# Patient Record
Sex: Male | Born: 1960 | Race: White | Hispanic: No | Marital: Married | Smoking: Current every day smoker
Health system: Southern US, Community
[De-identification: ages and names within clinical notes are randomized; demographics above are authoritative.]

## PROBLEM LIST (undated history)

## (undated) DIAGNOSIS — C801 Malignant (primary) neoplasm, unspecified: Secondary | ICD-10-CM

## (undated) DIAGNOSIS — R0600 Dyspnea, unspecified: Secondary | ICD-10-CM

## (undated) DIAGNOSIS — F419 Anxiety disorder, unspecified: Secondary | ICD-10-CM

## (undated) DIAGNOSIS — G709 Myoneural disorder, unspecified: Secondary | ICD-10-CM

## (undated) DIAGNOSIS — J449 Chronic obstructive pulmonary disease, unspecified: Secondary | ICD-10-CM

## (undated) DIAGNOSIS — E2740 Unspecified adrenocortical insufficiency: Secondary | ICD-10-CM

## (undated) DIAGNOSIS — K760 Fatty (change of) liver, not elsewhere classified: Secondary | ICD-10-CM

## (undated) DIAGNOSIS — F32A Depression, unspecified: Secondary | ICD-10-CM

## (undated) DIAGNOSIS — J439 Emphysema, unspecified: Secondary | ICD-10-CM

## (undated) DIAGNOSIS — H269 Unspecified cataract: Secondary | ICD-10-CM

## (undated) DIAGNOSIS — E079 Disorder of thyroid, unspecified: Secondary | ICD-10-CM

## (undated) DIAGNOSIS — C439 Malignant melanoma of skin, unspecified: Secondary | ICD-10-CM

## (undated) DIAGNOSIS — Z87442 Personal history of urinary calculi: Secondary | ICD-10-CM

## (undated) DIAGNOSIS — F1910 Other psychoactive substance abuse, uncomplicated: Secondary | ICD-10-CM

## (undated) HISTORY — DX: Emphysema, unspecified: J43.9

## (undated) HISTORY — DX: Myoneural disorder, unspecified: G70.9

## (undated) HISTORY — PX: WISDOM TOOTH EXTRACTION: SHX21

## (undated) HISTORY — DX: Anxiety disorder, unspecified: F41.9

## (undated) HISTORY — DX: Other psychoactive substance abuse, uncomplicated: F19.10

## (undated) HISTORY — DX: Unspecified cataract: H26.9

## (undated) HISTORY — DX: Disorder of thyroid, unspecified: E07.9

## (undated) HISTORY — DX: Malignant melanoma of skin, unspecified: C43.9

---

## 2015-10-30 DIAGNOSIS — H02839 Dermatochalasis of unspecified eye, unspecified eyelid: Secondary | ICD-10-CM | POA: Diagnosis not present

## 2015-10-30 DIAGNOSIS — H2513 Age-related nuclear cataract, bilateral: Secondary | ICD-10-CM | POA: Diagnosis not present

## 2015-10-30 DIAGNOSIS — H40003 Preglaucoma, unspecified, bilateral: Secondary | ICD-10-CM | POA: Diagnosis not present

## 2016-11-30 ENCOUNTER — Ambulatory Visit (INDEPENDENT_AMBULATORY_CARE_PROVIDER_SITE_OTHER): Payer: BLUE CROSS/BLUE SHIELD | Admitting: Adult Health

## 2016-11-30 ENCOUNTER — Encounter: Payer: Self-pay | Admitting: Adult Health

## 2016-11-30 VITALS — BP 122/81 | HR 73 | Ht 72.0 in | Wt 166.0 lb

## 2016-11-30 DIAGNOSIS — F17200 Nicotine dependence, unspecified, uncomplicated: Secondary | ICD-10-CM

## 2016-11-30 DIAGNOSIS — Z789 Other specified health status: Secondary | ICD-10-CM

## 2016-11-30 DIAGNOSIS — F1020 Alcohol dependence, uncomplicated: Secondary | ICD-10-CM | POA: Insufficient documentation

## 2016-11-30 DIAGNOSIS — R6889 Other general symptoms and signs: Secondary | ICD-10-CM | POA: Diagnosis not present

## 2016-11-30 DIAGNOSIS — Z720 Tobacco use: Secondary | ICD-10-CM | POA: Insufficient documentation

## 2016-11-30 DIAGNOSIS — Z0000 Encounter for general adult medical examination without abnormal findings: Secondary | ICD-10-CM

## 2016-11-30 DIAGNOSIS — R5383 Other fatigue: Secondary | ICD-10-CM | POA: Diagnosis not present

## 2016-11-30 NOTE — Progress Notes (Signed)
Subjective:    Patient ID: Ginger Carne, male    DOB: 1960/07/16, 56 y.o.   MRN: [id number]  HPI:  Mr. Schreier is here to establish as a new pt.  He is a  56 year old male, who states "I am here because my lady made me come".  PMH:  Gastritis-last contact with GI specialist was 2015.  Gastritis r/t heavy EOTH consumption, he estimates to consumes >42 cans of beer/week-varies between lite-craft brands.  He smokes pack/day for the last 30 years.  He denies EOTH consumption interferes with interpersonal relationships/vocational commitments.  He reports drinking EOTH >30 years with significant increase 5 years ago, however her cannot associate a specific trigger with increased use.  He has not had regular healthcare in > 5 years.  He estimates to drink 10-15 ounces water/daily and eats only breakfast and lunch due to "drinking beer all night, I don't feel like eating dinner".  He has a gf and they have been together > 14 years and has two adult children ages 31,25.  He also reports poor sleep, typically resting only in 4 hr increments with frequent, early am wakefulness.  He estimates to sleep 6 hrs a night during week and frequent napping on weekends.  He reports recent lack of interest in hobbies/activities that began > 6 months ago and he feels that he may be mildly depressed.  He has been asked to cut back on drinking, however he doe snot believe that he is an alcoholic.  He reduced EOTH use 4 momths for a 3 week period and felt that "didn't make any bit of difference in my life".  He denies EOTH in am, reports first drink of the day after work. 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    Patient Care Team    Relationship Specialty Notifications Start End  Esaw Grandchild, NP PCP - General Family Medicine  11/30/16     Patient Active Problem List   Diagnosis Date Noted  . Routine health maintenance 11/30/2016  . Fatigue 11/30/2016  . Alcohol consumption heavy 11/30/2016  .  Tobacco use disorder 11/30/2016  . Lack of interest 11/30/2016     History reviewed. No pertinent past medical history.   Past Surgical History:  Procedure Laterality Date  . WISDOM TOOTH EXTRACTION       Family History  Problem Relation Age of Onset  . Stroke Mother   . Dementia Mother   . Stroke Maternal Grandmother      History  Drug Use No     History  Alcohol Use  . 25.2 oz/week  . 42 Cans of beer per week     History  Smoking Status  . Current Every Day Smoker  . Packs/day: 1.00  . Types: Cigarettes  . Start date: 05/17/1981  Smokeless Tobacco  . Never Used     No outpatient encounter prescriptions on file as of 11/30/2016.   No facility-administered encounter medications on file as of 11/30/2016.     Allergies: Patient has no known allergies.  Body mass index is 22.51 kg/m.  Blood pressure 122/81, pulse 73, height 6' (1.829 m), weight 166 lb (75.3 kg).   Review of Systems  Constitutional: Positive for fatigue. Negative for activity change, appetite change, chills, diaphoresis, fever and unexpected weight change.  Respiratory: Negative for cough, chest tightness, shortness of breath, wheezing and stridor.   Cardiovascular: Negative for chest pain, palpitations and leg swelling.  Gastrointestinal: Negative for abdominal  distention, abdominal pain, blood in stool, constipation, diarrhea, rectal pain and vomiting.  Endocrine: Negative for cold intolerance, heat intolerance, polydipsia, polyphagia and polyuria.  Genitourinary: Negative for difficulty urinating and flank pain.  Musculoskeletal: Negative for arthralgias, back pain, gait problem, joint swelling, myalgias, neck pain and neck stiffness.  Skin: Negative for color change, pallor, rash and wound.  Neurological: Negative for dizziness, tremors, weakness and headaches.       Intermittent neuropathy of lower extremities  Hematological: Does not bruise/bleed easily.  Psychiatric/Behavioral:  Positive for dysphoric mood and sleep disturbance. Negative for behavioral problems, decreased concentration, hallucinations, self-injury and suicidal ideas. The patient is not nervous/anxious and is not hyperactive.        Objective:   Physical Exam  Constitutional: He is oriented to person, place, and time. He appears well-developed and well-nourished. No distress.  HENT:  Head: Normocephalic and atraumatic.  Right Ear: Hearing, tympanic membrane, external ear and ear canal normal. Tympanic membrane is not erythematous and not bulging. No decreased hearing is noted.  Left Ear: Tympanic membrane, external ear and ear canal normal. Tympanic membrane is not erythematous and not bulging. No decreased hearing is noted.  Nose: Nose normal. Right sinus exhibits no maxillary sinus tenderness and no frontal sinus tenderness. Left sinus exhibits no maxillary sinus tenderness and no frontal sinus tenderness.  Mouth/Throat: Uvula is midline, oropharynx is clear and moist and mucous membranes are normal. Abnormal dentition. Dental caries present.  Eyes: Pupils are equal, round, and reactive to light. Conjunctivae are normal.  Neck: Normal range of motion. Neck supple.  Cardiovascular: Normal rate, regular rhythm, normal heart sounds and intact distal pulses.   No murmur heard. Pulmonary/Chest: Effort normal and breath sounds normal. No respiratory distress. He has no wheezes. He has no rales. He exhibits no tenderness.  Abdominal: Soft. Bowel sounds are normal. He exhibits no distension and no mass. There is no tenderness. There is no rigidity, no rebound, no guarding, no CVA tenderness, no tenderness at McBurney's point and negative Murphy's sign.  Musculoskeletal: Normal range of motion.  Lymphadenopathy:    He has no cervical adenopathy.  Neurological: He is alert and oriented to person, place, and time. Coordination normal.  Skin: Skin is warm and dry. No rash noted. He is not diaphoretic. No  erythema. No pallor.  Psychiatric: He has a normal mood and affect. His behavior is normal. Judgment and thought content normal.          Assessment & Plan:   1. Routine health maintenance   2. Fatigue, unspecified type   3. Alcohol consumption heavy   4. Tobacco use disorder   5. Lack of interest     Fatigue TSH and Vit d levels checked today.  Routine health maintenance BP at goal today- 122/81 Please reduce your tobacco and alcohol intake-WELL above CDC safe drinking guidelines. Please follow heart healthy diet and increase water intake, recommend to consumed at least 80 ounces/daily. Full fasting labs obtained today. Recommend annual follow-up, with a yearly full physical. Please find out when your last Tdap vaccine was. Contact [facility], [place] to discuss colonoscopy.   Alcohol consumption heavy Estimates >42 beers/week- varies between lite beers and craft beers. Has been drinking > 30 years, however dramatic increase in amount in the last 5 years (he denies obvious trigger prior to increase). Denies EOTH use interfering with personal relationships/vocational commitments. He denies hx of DUI. We discussed that his is WELL above CDC safe drinking guidelines-he declined referral to  mental health or addiction specialist.  Tobacco use disorder Declined tobacco cessation today.  Lack of interest Discussed that excessive EOTH use can contribute to poor sleep, decreased motivation, depression. He declined referral to CBT or addiction specialist.     FOLLOW-UP:  Return in about 1 year (around 11/30/2017) for Regular Follow Up.

## 2016-11-30 NOTE — Assessment & Plan Note (Signed)
Estimates >42 beers/week- varies between lite beers and craft beers. Has been drinking > 30 years, however dramatic increase in amount in the last 5 years (he denies obvious trigger prior to increase). Denies EOTH use interfering with personal relationships/vocational commitments. He denies hx of DUI. We discussed that his is WELL above CDC safe drinking guidelines-he declined referral to mental health or addiction specialist.

## 2016-11-30 NOTE — Patient Instructions (Addendum)
Heart-Healthy Eating Plan Many factors influence your heart health, including eating and exercise habits. Heart (coronary) risk increases with abnormal blood fat (lipid) levels. Heart-healthy meal planning includes limiting unhealthy fats, increasing healthy fats, and making other small dietary changes. This includes maintaining a healthy body weight to help keep lipid levels within a normal range. What is my plan? Your health care provider recommends that you:  Get no more than _________% of the total calories in your daily diet from fat.  Limit your intake of saturated fat to less than _________% of your total calories each day.  Limit the amount of cholesterol in your diet to less than _________ mg per day.  What types of fat should I choose?  Choose healthy fats more often. Choose monounsaturated and polyunsaturated fats, such as olive oil and canola oil, flaxseeds, walnuts, almonds, and seeds.  Eat more omega-3 fats. Good choices include salmon, mackerel, sardines, tuna, flaxseed oil, and ground flaxseeds. Aim to eat fish at least two times each week.  Limit saturated fats. Saturated fats are primarily found in animal products, such as meats, butter, and cream. Plant sources of saturated fats include palm oil, palm kernel oil, and coconut oil.  Avoid foods with partially hydrogenated oils in them. These contain trans fats. Examples of foods that contain trans fats are stick margarine, some tub margarines, cookies, crackers, and other baked goods. What general guidelines do I need to follow?  Check food labels carefully to identify foods with trans fats or high amounts of saturated fat.  Fill one half of your plate with vegetables and green salads. Eat 4-5 servings of vegetables per day. A serving of vegetables equals 1 cup of raw leafy vegetables,  cup of raw or cooked cut-up vegetables, or  cup of vegetable juice.  Fill one fourth of your plate with whole grains. Look for the word  "whole" as the first word in the ingredient list.  Fill one fourth of your plate with lean protein foods.  Eat 4-5 servings of fruit per day. A serving of fruit equals one medium whole fruit,  cup of dried fruit,  cup of fresh, frozen, or canned fruit, or  cup of 100% fruit juice.  Eat more foods that contain soluble fiber. Examples of foods that contain this type of fiber are apples, broccoli, carrots, beans, peas, and barley. Aim to get 20-30 g of fiber per day.  Eat more home-cooked food and less restaurant, buffet, and fast food.  Limit or avoid alcohol.  Limit foods that are high in starch and sugar.  Avoid fried foods.  Cook foods by using methods other than frying. Baking, boiling, grilling, and broiling are all great options. Other fat-reducing suggestions include: ? Removing the skin from poultry. ? Removing all visible fats from meats. ? Skimming the fat off of stews, soups, and gravies before serving them. ? Steaming vegetables in water or broth.  Lose weight if you are overweight. Losing just 5-10% of your initial body weight can help your overall health and prevent diseases such as diabetes and heart disease.  Increase your consumption of nuts, legumes, and seeds to 4-5 servings per week. One serving of dried beans or legumes equals  cup after being cooked, one serving of nuts equals 1 ounces, and one serving of seeds equals  ounce or 1 tablespoon.  You may need to monitor your salt (sodium) intake, especially if you have high blood pressure. Talk with your health care provider or dietitian to get  more information about reducing sodium. What foods can I eat? Grains  Breads, including [place], white, pita, wheat, raisin, rye, oatmeal, and [place]. Tortillas that are neither fried nor made with lard or trans fat. Low-fat rolls, including hotdog and hamburger buns and English muffins. Biscuits. Muffins. Waffles. Pancakes. Light popcorn. Whole-grain cereals. Flatbread.  Melba toast. Pretzels. Breadsticks. Rusks. Low-fat snacks and crackers, including oyster, saltine, matzo, graham, animal, and rye. Rice and pasta, including brown rice and those that are made with whole wheat. Vegetables All vegetables. Fruits All fruits, but limit coconut. Meats and Other Protein Sources Lean, well-trimmed beef, veal, pork, and lamb. Chicken and [place] without skin. All fish and shellfish. Wild duck, rabbit, pheasant, and venison. Egg whites or low-cholesterol egg substitutes. Dried beans, peas, lentils, and tofu.Seeds and most nuts. Dairy Low-fat or nonfat cheeses, including ricotta, string, and mozzarella. Skim or 1% milk that is liquid, powdered, or evaporated. Buttermilk that is made with low-fat milk. Nonfat or low-fat yogurt. Beverages Mineral water. Diet carbonated beverages. Sweets and Desserts Sherbets and fruit ices. Honey, jam, marmalade, jelly, and syrups. Meringues and gelatins. Pure sugar candy, such as hard candy, jelly beans, gumdrops, mints, marshmallows, and small amounts of dark chocolate. [employer] Eat all sweets and desserts in moderation. Fats and Oils Nonhydrogenated (trans-free) margarines. Vegetable oils, including soybean, sesame, sunflower, olive, peanut, safflower, corn, canola, and cottonseed. Salad dressings or mayonnaise that are made with a vegetable oil. Limit added fats and oils that you use for cooking, baking, salads, and as spreads. Other Cocoa powder. Coffee and tea. All seasonings and condiments. The items listed above may not be a complete list of recommended foods or beverages. Contact your dietitian for more options. What foods are not recommended? Grains Breads that are made with saturated or trans fats, oils, or whole milk. Croissants. Butter rolls. Cheese breads. Sweet rolls. Donuts. Buttered popcorn. Chow mein noodles. High-fat crackers, such as cheese or butter crackers. Meats and Other Protein Sources Fatty meats, such  as hotdogs, short ribs, sausage, spareribs, bacon, ribeye roast or steak, and mutton. High-fat deli meats, such as salami and bologna. Caviar. Domestic duck and goose. Organ meats, such as kidney, liver, sweetbreads, brains, gizzard, chitterlings, and heart. Dairy Cream, sour cream, cream cheese, and creamed cottage cheese. Whole milk cheeses, including blue (bleu), Monte[place] Jack, [place], [place], [place], [place], [place], [place], [place], and [place]. Whole or 2% milk that is liquid, evaporated, or condensed. Whole buttermilk. Cream sauce or high-fat cheese sauce. Yogurt that is made from whole milk. Beverages Regular sodas and drinks with added sugar. Sweets and Desserts Frosting. Pudding. Cookies. Cakes other than angel food cake. Candy that has milk chocolate or white chocolate, hydrogenated fat, butter, coconut, or unknown ingredients. Buttered syrups. Full-fat ice cream or ice cream drinks. Fats and Oils Gravy that has suet, meat fat, or shortening. Cocoa butter, hydrogenated oils, palm oil, coconut oil, palm kernel oil. These can often be found in baked products, candy, fried foods, nondairy creamers, and whipped toppings. Solid fats and shortenings, including bacon fat, salt pork, lard, and butter. Nondairy cream substitutes, such as coffee creamers and sour cream substitutes. Salad dressings that are made of unknown oils, cheese, or sour cream. The items listed above may not be a complete list of foods and beverages to avoid. Contact your dietitian for more information. This information is not intended to replace advice given to you by your health care provider. Make sure you discuss any questions you have with your health care  provider. Document Released: 02/10/2008 Document Revised: 11/21/2015 Document Reviewed: 10/25/2013 Elsevier Interactive Patient Education  2017 [place] blood pressure is within normal limits today- 122/81 Please reduce your tobacco and alcohol  intake. Please follow heart healthy diet and increase water intake, recommend to consumed at least 80 ounces/daily. We will call when lab results are available. Recommend annual follow-up, with a yearly full physical.  Please find out when your last Tdap vaccine was.  Contact [facility], [place] to discuss colonoscopy. [address], [place] [address]  [phone]  WELCOME TO THE PRACTICE!

## 2016-11-30 NOTE — Assessment & Plan Note (Signed)
Discussed that excessive EOTH use can contribute to poor sleep, decreased motivation, depression. He declined referral to CBT or addiction specialist.

## 2016-11-30 NOTE — Assessment & Plan Note (Signed)
TSH and Vit d levels checked today.

## 2016-11-30 NOTE — Assessment & Plan Note (Signed)
Declined tobacco cessation today 

## 2016-11-30 NOTE — Assessment & Plan Note (Addendum)
BP at goal today- 122/81 Please reduce your tobacco and alcohol intake-WELL above CDC safe drinking guidelines. Please follow heart healthy diet and increase water intake, recommend to consumed at least 80 ounces/daily. Full fasting labs obtained today. Recommend annual follow-up, with a yearly full physical. Please find out when your last Tdap vaccine was. Contact [facility], [place] to discuss colonoscopy.

## 2016-12-02 ENCOUNTER — Telehealth: Payer: Self-pay

## 2016-12-02 ENCOUNTER — Other Ambulatory Visit: Payer: Self-pay

## 2016-12-02 ENCOUNTER — Other Ambulatory Visit: Payer: Self-pay | Admitting: Adult Health

## 2016-12-02 DIAGNOSIS — Z789 Other specified health status: Secondary | ICD-10-CM

## 2016-12-02 DIAGNOSIS — E559 Vitamin D deficiency, unspecified: Secondary | ICD-10-CM | POA: Insufficient documentation

## 2016-12-02 LAB — CBC WITH DIFFERENTIAL/PLATELET
BASOS ABS: 0 10*3/uL (ref 0.0–0.2)
BASOS: 0 %
EOS (ABSOLUTE): 0.2 10*3/uL (ref 0.0–0.4)
Eos: 3 %
Hematocrit: 48.6 % (ref 37.5–51.0)
Hemoglobin: 16.7 g/dL (ref 13.0–17.7)
IMMATURE GRANS (ABS): 0 10*3/uL (ref 0.0–0.1)
IMMATURE GRANULOCYTES: 0 %
LYMPHS: 25 %
Lymphocytes Absolute: 1.5 10*3/uL (ref 0.7–3.1)
MCH: 34.6 pg — ABNORMAL HIGH (ref 26.6–33.0)
MCHC: 34.4 g/dL (ref 31.5–35.7)
MCV: 101 fL — AB (ref 79–97)
Monocytes Absolute: 0.6 10*3/uL (ref 0.1–0.9)
Monocytes: 10 %
NEUTROS PCT: 62 %
Neutrophils Absolute: 3.7 10*3/uL (ref 1.4–7.0)
PLATELETS: 207 10*3/uL (ref 150–379)
RBC: 4.82 x10E6/uL (ref 4.14–5.80)
RDW: 12.9 % (ref 12.3–15.4)
WBC: 6 10*3/uL (ref 3.4–10.8)

## 2016-12-02 LAB — COMPREHENSIVE METABOLIC PANEL
ALT: 84 IU/L — ABNORMAL HIGH (ref 0–44)
AST: 70 IU/L — AB (ref 0–40)
Albumin/Globulin Ratio: 1.7 (ref 1.2–2.2)
Albumin: 4.5 g/dL (ref 3.5–5.5)
Alkaline Phosphatase: 123 IU/L — ABNORMAL HIGH (ref 39–117)
BUN/Creatinine Ratio: 13 (ref 9–20)
BUN: 10 mg/dL (ref 6–24)
Bilirubin Total: 0.8 mg/dL (ref 0.0–1.2)
CALCIUM: 9.2 mg/dL (ref 8.7–10.2)
CO2: 21 mmol/L (ref 20–29)
CREATININE: 0.8 mg/dL (ref 0.76–1.27)
Chloride: 102 mmol/L (ref 96–106)
GFR calc Af Amer: 115 mL/min/{1.73_m2} (ref >59)
GFR, EST NON AFRICAN AMERICAN: 100 mL/min/{1.73_m2} (ref >59)
GLUCOSE: 90 mg/dL (ref 65–99)
Globulin, Total: 2.6 g/dL (ref 1.5–4.5)
Potassium: 4.6 mmol/L (ref 3.5–5.2)
Sodium: 140 mmol/L (ref 134–144)
Total Protein: 7.1 g/dL (ref 6.0–8.5)

## 2016-12-02 LAB — LIPID PANEL
Chol/HDL Ratio: 3.4 ratio (ref 0.0–5.0)
Cholesterol, Total: 174 mg/dL (ref 100–199)
HDL: 51 mg/dL (ref >39)
LDL Calculated: 102 mg/dL — ABNORMAL HIGH (ref 0–99)
Triglycerides: 104 mg/dL (ref 0–149)
VLDL Cholesterol Cal: 21 mg/dL (ref 5–40)

## 2016-12-02 LAB — VITAMIN D 25 HYDROXY (VIT D DEFICIENCY, FRACTURES): VIT D 25 HYDROXY: 12.5 ng/mL — AB (ref 30.0–100.0)

## 2016-12-02 LAB — HEMOGLOBIN A1C
Est. average glucose Bld gHb Est-mCnc: 103 mg/dL
HEMOGLOBIN A1C: 5.2 % (ref 4.8–5.6)

## 2016-12-02 LAB — TSH: TSH: 1.02 u[IU]/mL (ref 0.450–4.500)

## 2016-12-02 LAB — HEPATIC FUNCTION PANEL: BILIRUBIN, DIRECT: 0.27 mg/dL (ref 0.00–0.40)

## 2016-12-02 MED ORDER — VITAMIN D (ERGOCALCIFEROL) 1.25 MG (50000 UNIT) PO CAPS: 50000.0000 [IU] | ORAL_CAPSULE | ORAL | 0 refills | Status: DC

## 2016-12-02 NOTE — Telephone Encounter (Signed)
-----   Message from Esaw Grandchild, NP sent at 12/02/2016 10:02 AM EDT ----- Good Morning, Please call [facility] and share that overall his labs look ok, exception: Vit d is quite low at 12.5-please start once weekly Rx vit d and we will re-check level in 4 months. Due to heavy beer consumption- liver function is very abnormal, AST 70 (normal 0-40), ALT 84 (0-44), Alkaline Phosphatase 123 (normal 39-117). Please encourage him to reduce daily beer drinking (we discussed this at length at initial OV). Please encourage him to make GI appt for colonoscopy. We will recheck liver function in 6 months. Thanks!

## 2016-12-02 NOTE — Telephone Encounter (Signed)
Left message for patient to call office to discuss lab results and recommendations. 

## 2017-03-18 ENCOUNTER — Other Ambulatory Visit: Payer: Self-pay | Admitting: Adult Health

## 2017-03-22 ENCOUNTER — Other Ambulatory Visit: Payer: Self-pay | Admitting: Adult Health

## 2017-05-04 ENCOUNTER — Other Ambulatory Visit: Payer: BLUE CROSS/BLUE SHIELD

## 2017-05-04 DIAGNOSIS — E559 Vitamin D deficiency, unspecified: Secondary | ICD-10-CM

## 2017-05-04 DIAGNOSIS — Z789 Other specified health status: Secondary | ICD-10-CM | POA: Diagnosis not present

## 2017-05-05 LAB — HEPATIC FUNCTION PANEL
ALBUMIN: 4.5 g/dL (ref 3.5–5.5)
ALK PHOS: 87 IU/L (ref 39–117)
ALT: 53 IU/L — ABNORMAL HIGH (ref 0–44)
AST: 53 IU/L — ABNORMAL HIGH (ref 0–40)
BILIRUBIN TOTAL: 0.5 mg/dL (ref 0.0–1.2)
BILIRUBIN, DIRECT: 0.18 mg/dL (ref 0.00–0.40)
TOTAL PROTEIN: 7.2 g/dL (ref 6.0–8.5)

## 2017-05-05 LAB — VITAMIN D 25 HYDROXY (VIT D DEFICIENCY, FRACTURES): VIT D 25 HYDROXY: 50.1 ng/mL (ref 30.0–100.0)

## 2017-06-01 ENCOUNTER — Encounter: Payer: Self-pay | Admitting: Adult Health

## 2017-06-01 ENCOUNTER — Ambulatory Visit (INDEPENDENT_AMBULATORY_CARE_PROVIDER_SITE_OTHER): Payer: BLUE CROSS/BLUE SHIELD | Admitting: Adult Health

## 2017-06-01 VITALS — BP 111/70 | HR 69 | Temp 98.2°F | Ht 72.0 in | Wt 173.2 lb

## 2017-06-01 DIAGNOSIS — J0100 Acute maxillary sinusitis, unspecified: Secondary | ICD-10-CM

## 2017-06-01 DIAGNOSIS — F17200 Nicotine dependence, unspecified, uncomplicated: Secondary | ICD-10-CM

## 2017-06-01 DIAGNOSIS — R05 Cough: Secondary | ICD-10-CM

## 2017-06-01 DIAGNOSIS — Z0000 Encounter for general adult medical examination without abnormal findings: Secondary | ICD-10-CM | POA: Diagnosis not present

## 2017-06-01 DIAGNOSIS — R059 Cough, unspecified: Secondary | ICD-10-CM

## 2017-06-01 MED ORDER — HYDROCODONE-HOMATROPINE 5-1.5 MG PO TABS: 1.0000 | ORAL_TABLET | Freq: Two times a day (BID) | ORAL | 0 refills | Status: DC | PRN

## 2017-06-01 MED ORDER — AZITHROMYCIN 250 MG PO TABS: ORAL_TABLET | ORAL | 0 refills | Status: DC

## 2017-06-01 MED ORDER — FLUTICASONE PROPIONATE 50 MCG/ACT NA SUSP: 2.0000 | Freq: Every day | NASAL | 6 refills | Status: DC

## 2017-06-01 NOTE — Assessment & Plan Note (Signed)
Z pack Flonase Stop tobacco use

## 2017-06-01 NOTE — Assessment & Plan Note (Signed)
Declined smoking cessation today. Reduce-stop tobacco use

## 2017-06-01 NOTE — Assessment & Plan Note (Signed)
[place] Controlled Substance Database reviewed- no contraindications noted. Please take Azithromycin, HydrocodoneHomatropine 5/1.5 tabs Q12H PRN cough, and Flonase as directed. Increase water/rest/vit c-2,000mg /day when not feeling well. Do not drink alcohol when taking cough medicine. Reduce-stop tobacco use. If symptoms persist after antibiotic completed, please call clinic. Work Excuse provided, okay to return 06/04/17

## 2017-06-01 NOTE — Assessment & Plan Note (Signed)
Please schedule full physical this Spring to address chronic issues.

## 2017-06-01 NOTE — Patient Instructions (Addendum)
Acute Bronchitis, Adult Acute bronchitis is sudden (acute) swelling of the air tubes (bronchi) in the lungs. Acute bronchitis causes these tubes to fill with mucus, which can make it hard to breathe. It can also cause coughing or wheezing. In adults, acute bronchitis usually goes away within 2 weeks. A cough caused by bronchitis may last up to 3 weeks. Smoking, allergies, and asthma can make the condition worse. Repeated episodes of bronchitis may cause further lung problems, such as chronic obstructive pulmonary disease (COPD). What are the causes? This condition can be caused by germs and by substances that irritate the lungs, including:  Cold and flu viruses. This condition is most often caused by the same virus that causes a cold.  Bacteria.  Exposure to tobacco smoke, dust, fumes, and air pollution.  What increases the risk? This condition is more likely to develop in people who:  Have close contact with someone with acute bronchitis.  Are exposed to lung irritants, such as tobacco smoke, dust, fumes, and vapors.  Have a weak immune system.  Have a respiratory condition such as asthma.  What are the signs or symptoms? Symptoms of this condition include:  A cough.  Coughing up clear, yellow, or green mucus.  Wheezing.  Chest congestion.  Shortness of breath.  A fever.  Body aches.  Chills.  A sore throat.  How is this diagnosed? This condition is usually diagnosed with a physical exam. During the exam, your health care provider may order tests, such as chest X-rays, to rule out other conditions. He or she may also:  Test a sample of your mucus for bacterial infection.  Check the level of oxygen in your blood. This is done to check for pneumonia.  Do a chest X-ray or lung function testing to rule out pneumonia and other conditions.  Perform blood tests.  Your health care provider will also ask about your symptoms and medical history. How is this  treated? Most cases of acute bronchitis clear up over time without treatment. Your health care provider may recommend:  Drinking more fluids. Drinking more makes your mucus thinner, which may make it easier to breathe.  Taking a medicine for a fever or cough.  Taking an antibiotic medicine.  Using an inhaler to help improve shortness of breath and to control a cough.  Using a cool mist vaporizer or humidifier to make it easier to breathe.  Follow these instructions at home: Medicines  Take over-the-counter and prescription medicines only as told by your health care provider.  If you were prescribed an antibiotic, take it as told by your health care provider. Do not stop taking the antibiotic even if you start to feel better. General instructions  Get plenty of rest.  Drink enough fluids to keep your urine clear or pale yellow.  Avoid smoking and secondhand smoke. Exposure to cigarette smoke or irritating chemicals will make bronchitis worse. If you smoke and you need help quitting, ask your health care provider. Quitting smoking will help your lungs heal faster.  Use an inhaler, cool mist vaporizer, or humidifier as told by your health care provider.  Keep all follow-up visits as told by your health care provider. This is important. How is this prevented? To lower your risk of getting this condition again:  Wash your hands often with soap and water. If soap and water are not available, use hand sanitizer.  Avoid contact with people who have cold symptoms.  Try not to touch your hands to your   mouth, nose, or eyes.  Make sure to get the flu shot every year.  Contact a health care provider if:  Your symptoms do not improve in 2 weeks of treatment. Get help right away if:  You cough up blood.  You have chest pain.  You have severe shortness of breath.  You become dehydrated.  You faint or keep feeling like you are going to faint.  You keep vomiting.  You have a  severe headache.  Your fever or chills gets worse. This information is not intended to replace advice given to you by your health care provider. Make sure you discuss any questions you have with your health care provider. Document Released: 06/10/2004 Document Revised: 11/26/2015 Document Reviewed: 10/22/2015 Elsevier Interactive Patient Education  2018 [employer].   Sinusitis, Adult Sinusitis is soreness and inflammation of your sinuses. Sinuses are hollow spaces in the bones around your face. Your sinuses are located:  Around your eyes.  In the middle of your forehead.  Behind your nose.  In your cheekbones.  Your sinuses and nasal passages are lined with a stringy fluid (mucus). Mucus normally drains out of your sinuses. When your nasal tissues become inflamed or swollen, the mucus can become trapped or blocked so air cannot flow through your sinuses. This allows bacteria, viruses, and funguses to grow, which leads to infection. Sinusitis can develop quickly and last for 7?10 days (acute) or for more than 12 weeks (chronic). Sinusitis often develops after a cold. What are the causes? This condition is caused by anything that creates swelling in the sinuses or stops mucus from draining, including:  Allergies.  Asthma.  Bacterial or viral infection.  Abnormally shaped bones between the nasal passages.  Nasal growths that contain mucus (nasal polyps).  Narrow sinus openings.  Pollutants, such as chemicals or irritants in the air.  A foreign object stuck in the nose.  A fungal infection. This is rare.  What increases the risk? The following factors may make you more likely to develop this condition:  Having allergies or asthma.  Having had a recent cold or respiratory tract infection.  Having structural deformities or blockages in your nose or sinuses.  Having a weak immune system.  Doing a lot of swimming or diving.  Overusing nasal  sprays.  Smoking.  What are the signs or symptoms? The main symptoms of this condition are pain and a feeling of pressure around the affected sinuses. Other symptoms include:  Upper toothache.  Earache.  Headache.  Bad breath.  Decreased sense of smell and taste.  A cough that may get worse at night.  Fatigue.  Fever.  Thick drainage from your nose. The drainage is often green and it may contain pus (purulent).  Stuffy nose or congestion.  Postnasal drip. This is when extra mucus collects in the throat or back of the nose.  Swelling and warmth over the affected sinuses.  Sore throat.  Sensitivity to light.  How is this diagnosed? This condition is diagnosed based on symptoms, a medical history, and a physical exam. To find out if your condition is acute or chronic, your health care provider may:  Look in your nose for signs of nasal polyps.  Tap over the affected sinus to check for signs of infection.  View the inside of your sinuses using an imaging device that has a light attached (endoscope).  If your health care provider suspects that you have chronic sinusitis, you may also:  Be tested for allergies.  Have a sample of mucus taken from your nose (nasal culture) and checked for bacteria.  Have a mucus sample examined to see if your sinusitis is related to an allergy.  If your sinusitis does not respond to treatment and it lasts longer than 8 weeks, you may have an MRI or CT scan to check your sinuses. These scans also help to determine how severe your infection is. In rare cases, a bone biopsy may be done to rule out more serious types of fungal sinus disease. How is this treated? Treatment for sinusitis depends on the cause and whether your condition is chronic or acute. If a virus is causing your sinusitis, your symptoms will go away on their own within 10 days. You may be given medicines to relieve your symptoms, including:  Topical nasal decongestants.  They shrink swollen nasal passages and let mucus drain from your sinuses.  Antihistamines. These drugs block inflammation that is triggered by allergies. This can help to ease swelling in your nose and sinuses.  Topical nasal corticosteroids. These are nasal sprays that ease inflammation and swelling in your nose and sinuses.  Nasal saline washes. These rinses can help to get rid of thick mucus in your nose.  If your condition is caused by bacteria, you will be given an antibiotic medicine. If your condition is caused by a fungus, you will be given an antifungal medicine. Surgery may be needed to correct underlying conditions, such as narrow nasal passages. Surgery may also be needed to remove polyps. Follow these instructions at home: Medicines  Take, use, or apply over-the-counter and prescription medicines only as told by your health care provider. These may include nasal sprays.  If you were prescribed an antibiotic medicine, take it as told by your health care provider. Do not stop taking the antibiotic even if you start to feel better. Hydrate and Humidify  Drink enough water to keep your urine clear or pale yellow. Staying hydrated will help to thin your mucus.  Use a cool mist humidifier to keep the humidity level in your home above 50%.  Inhale steam for 10-15 minutes, 3-4 times a day or as told by your health care provider. You can do this in the bathroom while a hot shower is running.  Limit your exposure to cool or dry air. Rest  Rest as much as possible.  Sleep with your head raised (elevated).  Make sure to get enough sleep each night. General instructions  Apply a warm, moist washcloth to your face 3-4 times a day or as told by your health care provider. This will help with discomfort.  Wash your hands often with soap and water to reduce your exposure to viruses and other germs. If soap and water are not available, use hand sanitizer.  Do not smoke. Avoid being  around people who are smoking (secondhand smoke).  Keep all follow-up visits as told by your health care provider. This is important. Contact a health care provider if:  You have a fever.  Your symptoms get worse.  Your symptoms do not improve within 10 days. Get help right away if:  You have a severe headache.  You have persistent vomiting.  You have pain or swelling around your face or eyes.  You have vision problems.  You develop confusion.  Your neck is stiff.  You have trouble breathing. This information is not intended to replace advice given to you by your health care provider. Make sure you discuss any questions you  have with your health care provider. Document Released: 05/03/2005 Document Revised: 12/28/2015 Document Reviewed: 02/26/2015 Elsevier Interactive Patient Education  2018 [employer].  Please take Azithromycin, Cough medicine, and Flonase as directed. Increase water/rest/vit c-2,000mg /day when not feeling well. Do not drink alcohol when taking cough syrup. Reduce-stop tobacco use. If symptoms persist after antibiotic completed, please call clinic. Work Excuse provided, okay to return 06/04/17 FEEL BETTER! Please schedule full physical this Spring to address chronic issues.

## 2017-06-01 NOTE — Progress Notes (Signed)
Subjective:    Patient ID: George West, male    DOB: 1960-05-23, 57 y.o.   MRN: [id number]  HPI:  George West presents with productive cough (thick/yellow mucus), copious thick/yellow nasal drainage, fatigue, and "ear fullness" that all began last week and is steadily worsening.  He continues to smoke, but has reduced amount to 1/2 pack a day.  He has also reduced ETOH use, only one drink in the last week due to "feeling so badly". He has not taken any OTC remedies. He only drinks coffee and sweat tea, no plain water.  Patient Care Team    Relationship Specialty Notifications Start End  Esaw Grandchild, NP PCP - General Family Medicine  11/30/16     Patient Active Problem List   Diagnosis Date Noted  . Cough in adult 06/01/2017  . Acute maxillary sinusitis 06/01/2017  . Vitamin D deficiency 12/02/2016  . Routine health maintenance 11/30/2016  . Fatigue 11/30/2016  . Alcohol consumption heavy 11/30/2016  . Tobacco use disorder 11/30/2016  . Lack of interest 11/30/2016     History reviewed. No pertinent past medical history.   Past Surgical History:  Procedure Laterality Date  . WISDOM TOOTH EXTRACTION       Family History  Problem Relation Age of Onset  . Stroke Mother   . Dementia Mother   . Stroke Maternal Grandmother      Social History   Substance and Sexual Activity  Drug Use No     Social History   Substance and Sexual Activity  Alcohol Use Yes  . Alcohol/week: 25.2 oz  . Types: 42 Cans of beer per week     Social History   Tobacco Use  Smoking Status Current Every Day Smoker  . Packs/day: 1.00  . Types: Cigarettes  . Start date: 05/17/1981  Smokeless Tobacco Never Used     Outpatient Encounter Medications as of 06/01/2017  Medication Sig  . Vitamin D, Ergocalciferol, (DRISDOL) 50000 units CAPS capsule TAKE 1 CAPSULE BY MOUTH EVERY 7 DAYS.  Marland Kitchen azithromycin (ZITHROMAX) 250 MG tablet 2 tabs by mouth day one, 1 tab by mouth days 2-5  .  fluticasone (FLONASE) 50 MCG/ACT nasal spray Place 2 sprays into both nostrils daily.  . Hydrocodone-Homatropine 5-1.5 MG TABS Take 1 tablet by mouth 2 (two) times daily as needed.   No facility-administered encounter medications on file as of 06/01/2017.     Allergies: Patient has no known allergies.  Body mass index is 23.49 kg/m.  Blood pressure 111/70, pulse 69, temperature 98.2 F (36.8 C), temperature source Oral, height 6' (1.829 m), weight 173 lb 3.2 oz (78.6 kg), SpO2 96 %.      Review of Systems  Constitutional: Positive for activity change and fatigue. Negative for appetite change, chills, diaphoresis, fever and unexpected weight change.  HENT: Positive for congestion, postnasal drip, rhinorrhea, sinus pressure and sneezing. Negative for sinus pain, sore throat, trouble swallowing and voice change.   Eyes: Negative for visual disturbance.  Respiratory: Positive for cough and chest tightness. Negative for shortness of breath, wheezing and stridor.   Cardiovascular: Negative for chest pain, palpitations and leg swelling.  Gastrointestinal: Negative for abdominal distention, abdominal pain, anal bleeding, constipation, diarrhea, nausea and vomiting.  Endocrine: Negative for cold intolerance, heat intolerance, polydipsia, polyphagia and polyuria.  Genitourinary: Negative for difficulty urinating, frequency and hematuria.  Neurological: Negative for dizziness and headaches.  Hematological: Does not bruise/bleed easily.  Psychiatric/Behavioral: Positive for sleep disturbance.  Objective:   Physical Exam  Constitutional: He appears well-developed and well-nourished. No distress.  HENT:  Head: Normocephalic and atraumatic.  Right Ear: Hearing, external ear and ear canal normal. Tympanic membrane is bulging. Tympanic membrane is not erythematous. No decreased hearing is noted.  Left Ear: External ear normal. Tympanic membrane is bulging. Tympanic membrane is not  erythematous.  Nose: Mucosal edema and rhinorrhea present. Right sinus exhibits maxillary sinus tenderness. Right sinus exhibits no frontal sinus tenderness. Left sinus exhibits maxillary sinus tenderness. Left sinus exhibits no frontal sinus tenderness.  Mouth/Throat: Uvula is midline and mucous membranes are normal. Abnormal dentition. Dental caries present. Posterior oropharyngeal edema and posterior oropharyngeal erythema present. No oropharyngeal exudate or tonsillar abscesses.  Eyes: Conjunctivae are normal. Pupils are equal, round, and reactive to light.  Neck: Normal range of motion. Neck supple.  Cardiovascular: Normal rate, regular rhythm, normal heart sounds and intact distal pulses.  Pulmonary/Chest: Effort normal and breath sounds normal. No respiratory distress. He has no wheezes. He has no rales. He exhibits no tenderness.  Lymphadenopathy:    He has no cervical adenopathy.  Skin: Skin is warm and dry. No rash noted. He is not diaphoretic. No erythema. No pallor.  Psychiatric: He has a normal mood and affect. His behavior is normal. Judgment and thought content normal.  Nursing note and vitals reviewed.         Assessment & Plan:   1. Cough in adult   2. Routine health maintenance   3. Acute maxillary sinusitis, recurrence not specified   4. Tobacco use disorder     Routine health maintenance Please schedule full physical this Spring to address chronic issues.  Tobacco use disorder Declined smoking cessation today. Reduce-stop tobacco use  Acute maxillary sinusitis Z pack Flonase Stop tobacco use  Cough in adult [place] Controlled Substance Database reviewed- no contraindications noted. Please take Azithromycin, HydrocodoneHomatropine 5/1.5 tabs Q12H PRN cough, and Flonase as directed. Increase water/rest/vit c-2,000mg /day when not feeling well. Do not drink alcohol when taking cough medicine. Reduce-stop tobacco use. If symptoms persist after  antibiotic completed, please call clinic. Work Excuse provided, okay to return 06/04/17    FOLLOW-UP:  Return in about 3 months (around 08/30/2017) for CPE.

## 2017-07-26 ENCOUNTER — Other Ambulatory Visit: Payer: Self-pay | Admitting: Family Medicine

## 2017-07-26 NOTE — Telephone Encounter (Signed)
Please review and refill if appropriate.  T. Merie Wulf, CMA  

## 2017-08-11 ENCOUNTER — Other Ambulatory Visit: Payer: Self-pay | Admitting: Family Medicine

## 2017-08-25 ENCOUNTER — Other Ambulatory Visit: Payer: BLUE CROSS/BLUE SHIELD

## 2017-08-25 ENCOUNTER — Other Ambulatory Visit: Payer: Self-pay

## 2017-08-25 DIAGNOSIS — Z0000 Encounter for general adult medical examination without abnormal findings: Secondary | ICD-10-CM

## 2017-08-25 DIAGNOSIS — Z789 Other specified health status: Secondary | ICD-10-CM | POA: Diagnosis not present

## 2017-08-25 DIAGNOSIS — E559 Vitamin D deficiency, unspecified: Secondary | ICD-10-CM

## 2017-08-25 DIAGNOSIS — R5383 Other fatigue: Secondary | ICD-10-CM

## 2017-08-26 LAB — COMPREHENSIVE METABOLIC PANEL
A/G RATIO: 1.5 (ref 1.2–2.2)
ALK PHOS: 96 IU/L (ref 39–117)
ALT: 28 IU/L (ref 0–44)
AST: 25 IU/L (ref 0–40)
Albumin: 4.2 g/dL (ref 3.5–5.5)
BILIRUBIN TOTAL: 0.9 mg/dL (ref 0.0–1.2)
BUN/Creatinine Ratio: 11 (ref 9–20)
BUN: 9 mg/dL (ref 6–24)
CHLORIDE: 106 mmol/L (ref 96–106)
CO2: 23 mmol/L (ref 20–29)
Calcium: 9 mg/dL (ref 8.7–10.2)
Creatinine, Ser: 0.81 mg/dL (ref 0.76–1.27)
GFR calc non Af Amer: 99 mL/min/{1.73_m2} (ref >59)
GFR, EST AFRICAN AMERICAN: 114 mL/min/{1.73_m2} (ref >59)
GLUCOSE: 94 mg/dL (ref 65–99)
Globulin, Total: 2.8 g/dL (ref 1.5–4.5)
POTASSIUM: 4.2 mmol/L (ref 3.5–5.2)
Sodium: 141 mmol/L (ref 134–144)
TOTAL PROTEIN: 7 g/dL (ref 6.0–8.5)

## 2017-08-26 LAB — CBC WITH DIFFERENTIAL/PLATELET
BASOS ABS: 0 10*3/uL (ref 0.0–0.2)
Basos: 0 %
EOS (ABSOLUTE): 0.3 10*3/uL (ref 0.0–0.4)
Eos: 4 %
Hematocrit: 45.5 % (ref 37.5–51.0)
Hemoglobin: 15.8 g/dL (ref 13.0–17.7)
Immature Grans (Abs): 0 10*3/uL (ref 0.0–0.1)
Immature Granulocytes: 0 %
LYMPHS: 34 %
Lymphocytes Absolute: 2.5 10*3/uL (ref 0.7–3.1)
MCH: 34.1 pg — AB (ref 26.6–33.0)
MCHC: 34.7 g/dL (ref 31.5–35.7)
MCV: 98 fL — ABNORMAL HIGH (ref 79–97)
Monocytes Absolute: 0.7 10*3/uL (ref 0.1–0.9)
Monocytes: 10 %
NEUTROS ABS: 3.8 10*3/uL (ref 1.4–7.0)
Neutrophils: 52 %
PLATELETS: 212 10*3/uL (ref 150–379)
RBC: 4.64 x10E6/uL (ref 4.14–5.80)
RDW: 13.7 % (ref 12.3–15.4)
WBC: 7.3 10*3/uL (ref 3.4–10.8)

## 2017-08-26 LAB — LIPID PANEL
CHOL/HDL RATIO: 3.1 ratio (ref 0.0–5.0)
Cholesterol, Total: 174 mg/dL (ref 100–199)
HDL: 56 mg/dL (ref >39)
LDL CALC: 92 mg/dL (ref 0–99)
Triglycerides: 130 mg/dL (ref 0–149)
VLDL CHOLESTEROL CAL: 26 mg/dL (ref 5–40)

## 2017-08-26 LAB — TSH: TSH: 1.49 u[IU]/mL (ref 0.450–4.500)

## 2017-08-26 LAB — VITAMIN D 25 HYDROXY (VIT D DEFICIENCY, FRACTURES): Vit D, 25-Hydroxy: 42 ng/mL (ref 30.0–100.0)

## 2017-08-26 LAB — HEMOGLOBIN A1C
Est. average glucose Bld gHb Est-mCnc: 105 mg/dL
HEMOGLOBIN A1C: 5.3 % (ref 4.8–5.6)

## 2017-08-29 NOTE — Progress Notes (Addendum)
Subjective:    Patient ID: George West, male    DOB: January 29, 1961, 57 y.o.   MRN: [id number]  HPI: 11/30/16 OV: George West is here to establish as a new pt.  He is a  57 year old male, who states "I am here because my lady made me come".  PMH:  Gastritis-last contact with GI specialist was 2015.  Gastritis r/t heavy EOTH consumption, he estimates to consumes >42 cans of beer/week-varies between lite-craft brands.  He smokes pack/day for the last 30 years.  He denies EOTH consumption interferes with interpersonal relationships/vocational commitments.  He reports drinking EOTH >30 years with significant increase 5 years ago, however her cannot associate a specific trigger with increased use.  He has not had regular healthcare in > 5 years.  He estimates to drink 10-15 ounces water/daily and eats only breakfast and lunch due to "drinking beer all night, I don't feel like eating dinner".  He has a gf and they have been together > 14 years and has two adult children ages 63,25.  He also reports poor sleep, typically resting only in 4 hr increments with frequent, early am wakefulness.  He estimates to sleep 6 hrs a night during week and frequent napping on weekends.  He reports recent lack of interest in hobbies/activities that began > 6 months ago and he feels that he may be mildly depressed.  He has been asked to cut back on drinking, however he doe snot believe that he is an alcoholic.  He reduced EOTH use 4 momths for a 3 week period and felt that "didn't make any bit of difference in my life".  He denies EOTH in am, reports first drink of the day after work. 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    08/30/17 OV: George West presents for CPE He is concerned about sig fatigue for the last 2 years.  He also reports dyspnea with exertion, ie walking up flight of stairs He continues to smoke, >pack/day He drinks 3-4 beers/ay He drinks pot of coffee, last intake around 1400 He  reports frequent waking in early am He estimates to walk mile/day when at work, denies formal exercise outside of work  Reviewed recent lab results   Patient Care Team    Relationship Specialty Notifications Start End  Danford, Berna Spare, NP PCP - General Family Medicine  11/30/16     Patient Active Problem List   Diagnosis Date Noted  . Encounter for screening fecal occult blood testing 08/30/2017  . Shortness of breath on exertion 08/30/2017  . Cough in adult 06/01/2017  . Acute maxillary sinusitis 06/01/2017  . Vitamin D deficiency 12/02/2016  . Routine health maintenance 11/30/2016  . Fatigue 11/30/2016  . Alcohol consumption heavy 11/30/2016  . Tobacco use disorder 11/30/2016  . Lack of interest 11/30/2016     History reviewed. No pertinent past medical history.   Past Surgical History:  Procedure Laterality Date  . WISDOM TOOTH EXTRACTION       Family History  Problem Relation Age of Onset  . Stroke Mother   . Dementia Mother   . Stroke Maternal Grandmother      Social History   Substance and Sexual Activity  Drug Use No     Social History   Substance and Sexual Activity  Alcohol Use Yes  . Alcohol/week: 25.2 oz  . Types: 42 Cans of beer per week     Social History   Tobacco Use  Smoking Status Current Every Day Smoker  . Packs/day: 1.00  . Types: Cigarettes  . Start date: 05/17/1981  Smokeless Tobacco Never Used     Outpatient Encounter Medications as of 08/30/2017  Medication Sig  . [DISCONTINUED] azithromycin (ZITHROMAX) 250 MG tablet 2 tabs by mouth day one, 1 tab by mouth days 2-5  . [DISCONTINUED] fluticasone (FLONASE) 50 MCG/ACT nasal spray Place 2 sprays into both nostrils daily.  . [DISCONTINUED] Hydrocodone-Homatropine 5-1.5 MG TABS Take 1 tablet by mouth 2 (two) times daily as needed.  . [DISCONTINUED] Vitamin D, Ergocalciferol, (DRISDOL) 50000 units CAPS capsule TAKE 1 CAPSULE BY MOUTH EVERY 7 DAYS.   No facility-administered  encounter medications on file as of 08/30/2017.     Allergies: Patient has no known allergies.  Body mass index is 23.86 kg/m.  Blood pressure 129/81, pulse 78, height 6' (1.829 m), weight 175 lb 14.4 oz (79.8 kg), SpO2 95 %.   Review of Systems  Constitutional: Positive for fatigue. Negative for activity change, appetite change, chills, diaphoresis, fever and unexpected weight change.  Respiratory: Positive for shortness of breath. Negative for cough, chest tightness, wheezing and stridor.   Cardiovascular: Negative for chest pain, palpitations and leg swelling.  Gastrointestinal: Negative for abdominal distention, abdominal pain, blood in stool, constipation, diarrhea, rectal pain and vomiting.  Endocrine: Negative for cold intolerance, heat intolerance, polydipsia, polyphagia and polyuria.  Genitourinary: Negative for difficulty urinating and flank pain.  Musculoskeletal: Negative for arthralgias, back pain, gait problem, joint swelling, myalgias, neck pain and neck stiffness.  Skin: Negative for color change, pallor, rash and wound.  Neurological: Negative for dizziness, tremors, weakness and headaches.       Intermittent neuropathy of lower extremities  Hematological: Does not bruise/bleed easily.  Psychiatric/Behavioral: Positive for dysphoric mood and sleep disturbance. Negative for behavioral problems, decreased concentration, hallucinations, self-injury and suicidal ideas. The patient is not nervous/anxious and is not hyperactive.        Objective:   Physical Exam  Constitutional: He is oriented to person, place, and time. He appears well-developed and well-nourished. No distress.  HENT:  Head: Normocephalic and atraumatic.  Right Ear: Hearing, tympanic membrane, external ear and ear canal normal. Tympanic membrane is not erythematous and not bulging. No decreased hearing is noted.  Left Ear: Tympanic membrane, external ear and ear canal normal. Tympanic membrane is not  erythematous and not bulging. No decreased hearing is noted.  Nose: Nose normal. Right sinus exhibits no maxillary sinus tenderness and no frontal sinus tenderness. Left sinus exhibits no maxillary sinus tenderness and no frontal sinus tenderness.  Mouth/Throat: Uvula is midline, oropharynx is clear and moist and mucous membranes are normal. Abnormal dentition. Dental caries present.  Eyes: Pupils are equal, round, and reactive to light. Conjunctivae are normal.  Neck: Normal range of motion. Neck supple.  Cardiovascular: Normal rate, regular rhythm, normal heart sounds and intact distal pulses.  No murmur heard. Pulmonary/Chest: Effort normal and breath sounds normal. No respiratory distress. He has no wheezes. He has no rales. He exhibits no tenderness.  Abdominal: Soft. Bowel sounds are normal. He exhibits no distension and no mass. There is no tenderness. There is no rigidity, no rebound, no guarding, no CVA tenderness, no tenderness at McBurney's point and negative Murphy's sign.  Genitourinary: Prostate normal. Rectal exam shows guaiac negative stool.  Genitourinary Comments: Chaperone present during examination.  Musculoskeletal: Normal range of motion.  Lymphadenopathy:    He has no cervical adenopathy.  Neurological: He is alert and oriented  to person, place, and time. Coordination normal.  Skin: Skin is warm and dry. No rash noted. He is not diaphoretic. No erythema. No pallor.  Onychomycosis of toenails  Psychiatric: He has a normal mood and affect. His behavior is normal. Judgment and thought content normal.      Assessment & Plan:   1. Encounter for screening fecal occult blood testing   2. Encounter for screening for malignant neoplasm of respiratory organs   3. Routine health maintenance   4. Fatigue, unspecified type   5. Shortness of breath on exertion   6. Alcohol consumption heavy   7. Tobacco use disorder     Routine health maintenance Overall labs look  good. Cologuard ordered. ECHO ordered to address shortness of breath with exertion and fatigue. CT scan ordered due to smoking history. Increase plain water intake. Decrease caffeine intake. Decrease beer intake, no more than 2/day Continue to walk as much as possible Follow-up 4 weeks, re: fatigue/shortness of breath  Shortness of breath on exertion Exercise Stress Echo ordered Reduce-stop tobacco use Reduce ETOH use  Alcohol consumption heavy Discussed CDC safe drinking guidelines, encouraged him not to consume more than 2 beers/day  Tobacco use disorder Currently smoking pack/day He declined smoking cessation today    FOLLOW-UP:  Return in about 1 month (around 09/27/2017) for Fatigue, Shortness of breath with exertion.

## 2017-08-30 ENCOUNTER — Ambulatory Visit (INDEPENDENT_AMBULATORY_CARE_PROVIDER_SITE_OTHER): Payer: BLUE CROSS/BLUE SHIELD | Admitting: Adult Health

## 2017-08-30 ENCOUNTER — Encounter: Payer: Self-pay | Admitting: Adult Health

## 2017-08-30 VITALS — BP 129/81 | HR 78 | Ht 72.0 in | Wt 175.9 lb

## 2017-08-30 DIAGNOSIS — F17200 Nicotine dependence, unspecified, uncomplicated: Secondary | ICD-10-CM | POA: Diagnosis not present

## 2017-08-30 DIAGNOSIS — R5383 Other fatigue: Secondary | ICD-10-CM

## 2017-08-30 DIAGNOSIS — R0602 Shortness of breath: Secondary | ICD-10-CM | POA: Diagnosis not present

## 2017-08-30 DIAGNOSIS — Z789 Other specified health status: Secondary | ICD-10-CM | POA: Diagnosis not present

## 2017-08-30 DIAGNOSIS — Z122 Encounter for screening for malignant neoplasm of respiratory organs: Secondary | ICD-10-CM | POA: Diagnosis not present

## 2017-08-30 DIAGNOSIS — Z0000 Encounter for general adult medical examination without abnormal findings: Secondary | ICD-10-CM | POA: Diagnosis not present

## 2017-08-30 DIAGNOSIS — Z1211 Encounter for screening for malignant neoplasm of colon: Secondary | ICD-10-CM | POA: Diagnosis not present

## 2017-08-30 LAB — IFOBT (OCCULT BLOOD): IFOBT: NEGATIVE

## 2017-08-30 NOTE — Addendum Note (Signed)
Addended by: Mina Marble D on: 08/30/2017 09:29 AM   Modules accepted: Orders

## 2017-08-30 NOTE — Assessment & Plan Note (Signed)
Discussed CDC safe drinking guidelines, encouraged him not to consume more than 2 beers/day

## 2017-08-30 NOTE — Assessment & Plan Note (Addendum)
Exercise Stress Echo ordered Reduce-stop tobacco use Reduce ETOH use

## 2017-08-30 NOTE — Assessment & Plan Note (Signed)
Currently smoking pack/day He declined smoking cessation today

## 2017-08-30 NOTE — Patient Instructions (Signed)

## 2017-08-30 NOTE — Assessment & Plan Note (Signed)
Overall labs look good. Cologuard ordered. ECHO ordered to address shortness of breath with exertion and fatigue. CT scan ordered due to smoking history. Increase plain water intake. Decrease caffeine intake. Decrease beer intake, no more than 2/day Continue to walk as much as possible Follow-up 4 weeks, re: fatigue/shortness of breath

## 2017-09-01 ENCOUNTER — Telehealth (HOSPITAL_COMMUNITY): Payer: Self-pay | Admitting: *Deleted

## 2017-09-01 ENCOUNTER — Telehealth: Payer: Self-pay

## 2017-09-01 NOTE — Telephone Encounter (Signed)
Called [employer] specialty service for PA for low dose lung CT for screening for lung CA.  PA did not meet medical necessity criteria.  Please call [employer] for Peer to Peer review at [phone].  Charyl Bigger, CMA

## 2017-09-01 NOTE — Telephone Encounter (Signed)
-----   Message from [facility]on[facility] sent at 08/30/2017  8:34 AM EDT ----- Regarding: [employer] PA for CT Lung Screen Patient is sch at [facility] ([address] location) on 09/13/17 for the CT Lung Screen. Need [employer] PA

## 2017-09-01 NOTE — Telephone Encounter (Signed)
Patient given detailed instructions per Stress Test Requisition Sheet for test on 09/06/17 at 7:30.Patient Notified to arrive 30 minutes early, and that it is imperative to arrive on time for appointment to keep from having the test rescheduled.  Patient verbalized understanding. Veronia Beets

## 2017-09-06 ENCOUNTER — Encounter (HOSPITAL_COMMUNITY): Payer: Self-pay

## 2017-09-06 ENCOUNTER — Ambulatory Visit (HOSPITAL_COMMUNITY): Payer: BLUE CROSS/BLUE SHIELD | Attending: Cardiology

## 2017-09-06 ENCOUNTER — Ambulatory Visit (HOSPITAL_COMMUNITY): Payer: BLUE CROSS/BLUE SHIELD

## 2017-09-06 ENCOUNTER — Telehealth (HOSPITAL_COMMUNITY): Payer: Self-pay | Admitting: Radiology

## 2017-09-06 NOTE — Telephone Encounter (Signed)
09/06/17-Patient unable to complete stress echocardiogram due to extreme shortness of breath. Patient requested the treadmill be stopped. Max heart rate was not reached so test was aborted.  Sending note to ordering physician.

## 2017-09-07 NOTE — Telephone Encounter (Signed)
Good Morning George Houseman, PA completed Auth Number- [id number] LDCT to be completed at [place] at [facility] in [facility] should call pt to schedule imaging appt. Thanks! Valetta Fuller

## 2017-09-07 NOTE — Telephone Encounter (Signed)
See PA information.  Charyl Bigger, CMA

## 2017-09-13 ENCOUNTER — Telehealth (HOSPITAL_COMMUNITY): Payer: Self-pay | Admitting: Radiology

## 2017-09-13 ENCOUNTER — Encounter (HOSPITAL_COMMUNITY): Payer: Self-pay | Admitting: Radiology

## 2017-09-13 ENCOUNTER — Ambulatory Visit: Payer: BLUE CROSS/BLUE SHIELD

## 2017-09-13 NOTE — Telephone Encounter (Signed)
Encounter made in error. 

## 2017-09-14 ENCOUNTER — Other Ambulatory Visit: Payer: Self-pay | Admitting: Adult Health

## 2017-09-14 ENCOUNTER — Other Ambulatory Visit: Payer: Self-pay

## 2017-09-14 DIAGNOSIS — R0602 Shortness of breath: Secondary | ICD-10-CM

## 2017-09-14 DIAGNOSIS — F17200 Nicotine dependence, unspecified, uncomplicated: Secondary | ICD-10-CM

## 2017-09-14 NOTE — Progress Notes (Signed)
Order changed per cards recommendation

## 2017-09-29 ENCOUNTER — Ambulatory Visit: Payer: BLUE CROSS/BLUE SHIELD | Admitting: Adult Health

## 2017-10-05 ENCOUNTER — Other Ambulatory Visit: Payer: Self-pay | Admitting: Adult Health

## 2017-10-05 ENCOUNTER — Telehealth: Payer: Self-pay

## 2017-10-05 DIAGNOSIS — R0609 Other forms of dyspnea: Secondary | ICD-10-CM

## 2017-10-05 DIAGNOSIS — R0600 Dyspnea, unspecified: Secondary | ICD-10-CM

## 2017-10-05 DIAGNOSIS — R0602 Shortness of breath: Secondary | ICD-10-CM

## 2017-10-05 NOTE — Telephone Encounter (Signed)
-----   Message from Esaw Grandchild, NP sent at 10/05/2017  8:11 AM EDT ----- Kermit Balo Morning Kenney Houseman, I have placed a referral to cardiology placed,re: increasing dyspnea with exertion.   >35 yr pack smoking hx Can you please call Mr. Grandstaff and let him know Thanks! Valetta Fuller

## 2017-10-05 NOTE — Progress Notes (Signed)
Referral to cardiology placed,re: increasing dyspnea with exertion >35 yr pack hx

## 2017-10-06 NOTE — Telephone Encounter (Signed)
Pt informed.  Pt expressed understanding and is agreeable to cards referral.  Charyl Bigger, CMA

## 2017-10-12 ENCOUNTER — Ambulatory Visit (HOSPITAL_COMMUNITY): Payer: BLUE CROSS/BLUE SHIELD

## 2020-02-20 ENCOUNTER — Ambulatory Visit (INDEPENDENT_AMBULATORY_CARE_PROVIDER_SITE_OTHER): Payer: BC Managed Care – PPO | Admitting: Internal Medicine

## 2020-02-20 ENCOUNTER — Ambulatory Visit (INDEPENDENT_AMBULATORY_CARE_PROVIDER_SITE_OTHER)
Admission: RE | Admit: 2020-02-20 | Discharge: 2020-02-20 | Disposition: A | Discharge status: Home / Self Care | Payer: BC Managed Care – PPO | Source: Ambulatory Visit | Attending: Internal Medicine | Admitting: Internal Medicine

## 2020-02-20 ENCOUNTER — Other Ambulatory Visit: Payer: Self-pay

## 2020-02-20 ENCOUNTER — Encounter: Payer: Self-pay | Admitting: Internal Medicine

## 2020-02-20 VITALS — BP 140/84 | HR 66 | Temp 97.9°F | Ht 70.75 in | Wt 171.0 lb

## 2020-02-20 DIAGNOSIS — R0602 Shortness of breath: Secondary | ICD-10-CM

## 2020-02-20 DIAGNOSIS — Z125 Encounter for screening for malignant neoplasm of prostate: Secondary | ICD-10-CM | POA: Diagnosis not present

## 2020-02-20 DIAGNOSIS — R200 Anesthesia of skin: Secondary | ICD-10-CM

## 2020-02-20 DIAGNOSIS — F39 Unspecified mood [affective] disorder: Secondary | ICD-10-CM

## 2020-02-20 DIAGNOSIS — Z23 Encounter for immunization: Secondary | ICD-10-CM | POA: Diagnosis not present

## 2020-02-20 DIAGNOSIS — Z1211 Encounter for screening for malignant neoplasm of colon: Secondary | ICD-10-CM

## 2020-02-20 DIAGNOSIS — F1020 Alcohol dependence, uncomplicated: Secondary | ICD-10-CM | POA: Diagnosis not present

## 2020-02-20 DIAGNOSIS — R0600 Dyspnea, unspecified: Secondary | ICD-10-CM | POA: Diagnosis not present

## 2020-02-20 DIAGNOSIS — J449 Chronic obstructive pulmonary disease, unspecified: Secondary | ICD-10-CM | POA: Diagnosis not present

## 2020-02-20 DIAGNOSIS — R5383 Other fatigue: Secondary | ICD-10-CM

## 2020-02-20 DIAGNOSIS — R911 Solitary pulmonary nodule: Secondary | ICD-10-CM | POA: Diagnosis not present

## 2020-02-20 LAB — RENAL FUNCTION PANEL
Albumin: 5 g/dL (ref 3.5–5.2)
BUN: 10 mg/dL (ref 6–23)
CO2: 30 mEq/L (ref 19–32)
Calcium: 10.4 mg/dL (ref 8.4–10.5)
Chloride: 99 mEq/L (ref 96–112)
Creatinine, Ser: 0.86 mg/dL (ref 0.40–1.50)
GFR: 94.18 mL/min (ref >60.00)
Glucose, Bld: 98 mg/dL (ref 70–99)
Phosphorus: 3.5 mg/dL (ref 2.3–4.6)
Potassium: 5 mEq/L (ref 3.5–5.1)
Sodium: 135 mEq/L (ref 135–145)

## 2020-02-20 LAB — CBC
HCT: 52.3 % — ABNORMAL HIGH (ref 39.0–52.0)
Hemoglobin: 17.9 g/dL — ABNORMAL HIGH (ref 13.0–17.0)
MCHC: 34.2 g/dL (ref 30.0–36.0)
MCV: 100 fl (ref 78.0–100.0)
Platelets: 221 10*3/uL (ref 150.0–400.0)
RBC: 5.23 Mil/uL (ref 4.22–5.81)
RDW: 12.8 % (ref 11.5–15.5)
WBC: 7.1 10*3/uL (ref 4.0–10.5)

## 2020-02-20 LAB — SEDIMENTATION RATE: Sed Rate: 18 mm/hr (ref 0–20)

## 2020-02-20 LAB — HEPATIC FUNCTION PANEL
ALT: 35 U/L (ref 0–53)
AST: 25 U/L (ref 0–37)
Albumin: 5 g/dL (ref 3.5–5.2)
Alkaline Phosphatase: 93 U/L (ref 39–117)
Bilirubin, Direct: 0.2 mg/dL (ref 0.0–0.3)
Total Bilirubin: 0.8 mg/dL (ref 0.2–1.2)
Total Protein: 8.5 g/dL — ABNORMAL HIGH (ref 6.0–8.3)

## 2020-02-20 LAB — VITAMIN D 25 HYDROXY (VIT D DEFICIENCY, FRACTURES): VITD: 24.38 ng/mL — ABNORMAL LOW (ref 30.00–100.00)

## 2020-02-20 LAB — VITAMIN B12: Vitamin B-12: 241 pg/mL (ref 211–911)

## 2020-02-20 LAB — PSA: PSA: 3.37 ng/mL (ref 0.10–4.00)

## 2020-02-20 LAB — T4, FREE: Free T4: 0.95 ng/dL (ref 0.60–1.60)

## 2020-02-20 MED ORDER — DULOXETINE HCL 30 MG PO CPEP: 30.0000 mg | ORAL_CAPSULE | Freq: Every day | ORAL | 3 refills | Status: DC

## 2020-02-20 NOTE — Assessment & Plan Note (Signed)
Mostly anxiety Has been self medicating with alcohol Will try the duloxetine

## 2020-02-20 NOTE — Assessment & Plan Note (Signed)
Seems to be related to anxiety based on wife's description Will try low dose duloxetine

## 2020-02-20 NOTE — Assessment & Plan Note (Signed)
Mostly related to DOE Will check labs

## 2020-02-20 NOTE — Progress Notes (Addendum)
Subjective:    Patient ID: George West, male    DOB: 28-Jan-1961, 59 y.o.   MRN: [id number]  HPI Here to establish care With wife George West This visit occurred during the SARS-CoV-2 public health emergency.  Safety protocols were in place, including screening questions prior to the visit, additional usage of staff PPE, and extensive cleaning of exam room while observing appropriate contact time as indicated for disinfecting solutions.   He has ongoing problems not being able to breathe Wife notes swelling in feet in the evening They feel cool at times--- and numb (from upper calf down bilaterally) No chest pain No palpitations No dizziness or syncope Has AM cough---brings up phlegm Smokes 1PPD---for 35 years Had been scheduled for stress echo in the past---but "wasn't able to do it" (the treadmill) The DOE has worsened  Has knot on upper right back Present for 1-2 years Wife thinks it is a cyst  Toenails discolored and thickened Some pain in great toes at times---he has to cut the nails out  No current outpatient medications on file prior to visit.   No current facility-administered medications on file prior to visit.    No Known Allergies  History reviewed. No pertinent past medical history.  Past Surgical History:  Procedure Laterality Date  . WISDOM TOOTH EXTRACTION      Family History  Problem Relation Age of Onset  . Stroke Mother   . Dementia Mother   . Stroke Maternal Grandmother   . Breast cancer Sister   . Thyroid cancer Sister   . Breast cancer Paternal Grandmother     Social History   Socioeconomic History  . Marital status: Married    Spouse name: Not on file  . Number of children: 2  . Years of education: Not on file  . Highest education level: Not on file  Occupational History  . Occupation: Public librarian    Comment: [employer]  Tobacco Use  . Smoking status: Current Every Day Smoker    Packs/day: 1.00    Types:  Cigarettes    Start date: 05/17/1981  . Smokeless tobacco: Never Used  Vaping Use  . Vaping Use: Never used  Substance and Sexual Activity  . Alcohol use: Yes    Alcohol/week: 42.0 standard drinks    Types: 42 Cans of beer per week    Comment: 6 pack per day or comparable liquor  . Drug use: No  . Sexual activity: Yes    Partners: Female    Birth control/protection: None  Other Topics Concern  . Not on file  Social History Narrative   Married   2 children--- 1 daughter/1 son   6 step children   Social Determinants of Health   Financial Resource Strain:   . Difficulty of Paying Living Expenses: Not on file  Food Insecurity:   . Worried About Charity fundraiser in the Last Year: Not on file  . Ran Out of Food in the Last Year: Not on file  Transportation Needs:   . Lack of Transportation (Medical): Not on file  . Lack of Transportation (Non-Medical): Not on file  Physical Activity:   . Days of Exercise per Week: Not on file  . Minutes of Exercise per Session: Not on file  Stress:   . Feeling of Stress : Not on file  Social Connections:   . Frequency of Communication with Friends and Family: Not on file  . Frequency of Social Gatherings with Friends and Family:  Not on file  . Attends Religious Services: Not on file  . Active Member of Clubs or Organizations: Not on file  . Attends Archivist Meetings: Not on file  . Marital Status: Not on file  Intimate Partner Violence:   . Fear of Current or Ex-Partner: Not on file  . Emotionally Abused: Not on file  . Physically Abused: Not on file  . Sexually Abused: Not on file   Review of Systems  Constitutional: Positive for fatigue. Negative for unexpected weight change.  HENT: Negative for dental problem and hearing loss.   Respiratory: Positive for cough and shortness of breath.   Cardiovascular: Positive for leg swelling. Negative for chest pain and palpitations.  Gastrointestinal: Negative for blood in stool and  constipation.       Gets abnormal sensation No N/V   Genitourinary: Negative for difficulty urinating and urgency.  Musculoskeletal: Negative for arthralgias, back pain and joint swelling.  Skin: Negative for rash.  Allergic/Immunologic: Negative for environmental allergies and immunocompromised state.  Neurological: Negative for dizziness, syncope, light-headedness and headaches.  Hematological: Bruises/bleeds easily.       Has lymph node in right neck--goes up and down (long time)  Psychiatric/Behavioral: Negative for dysphoric mood. The patient is nervous/anxious.        Not sleeping well Self medicates his anxiety with the alcohol       Objective:   Physical Exam Constitutional:      Appearance: He is well-developed.  HENT:     Mouth/Throat:     Comments: No oral lesions Cardiovascular:     Rate and Rhythm: Normal rate and regular rhythm.     Pulses: Normal pulses.     Heart sounds: No murmur heard.  No gallop.   Pulmonary:     Effort: Pulmonary effort is normal. No tachypnea or respiratory distress.     Breath sounds: Normal breath sounds. No wheezing or rales.  Chest:     Chest wall: No mass or deformity.  Abdominal:     Palpations: Abdomen is soft.     Tenderness: There is no abdominal tenderness.  Musculoskeletal:     Cervical back: Neck supple.  Lymphadenopathy:     Cervical: No cervical adenopathy.  Skin:    Findings: No rash.     Comments: Apparent cyst posterior right shoulder--- ~3cm diameter  Neurological:     Mental Status: He is alert.  Psychiatric:        Mood and Affect: Mood normal.        Behavior: Behavior normal.            Assessment & Plan:

## 2020-02-20 NOTE — Assessment & Plan Note (Addendum)
Symptoms consistent with neuropathy ?B deficiency or other cause Will check labs  Duloxetine could help this also

## 2020-02-20 NOTE — Addendum Note (Signed)
Addended by: Pilar Grammes on: 02/20/2020 02:12 PM   Modules accepted: Orders

## 2020-02-20 NOTE — Assessment & Plan Note (Addendum)
This goes back for years and is worsening Was sent for stress echo--but couldn't do the treadmill Unlikely to be CAD with the history and progression---but certainly possible Most likely chronic lung disease Will check EKG and CXR  CXR shows just hyperinflation EKG shows sinus at 53. Low voltage but normal axis and intervals. No hypertrophy or ischemic changes.no comparison  Will set up with pulmonary

## 2020-02-22 LAB — PROTEIN ELECTROPHORESIS, SERUM, WITH REFLEX
Gamma Globulin: 1.4 g/dL (ref 0.8–1.7)
Total Protein: 8.5 g/dL — ABNORMAL HIGH (ref 6.1–8.1)

## 2020-02-23 ENCOUNTER — Other Ambulatory Visit: Payer: Self-pay

## 2020-02-23 ENCOUNTER — Emergency Department (HOSPITAL_COMMUNITY): Payer: BC Managed Care – PPO

## 2020-02-23 ENCOUNTER — Emergency Department (HOSPITAL_COMMUNITY)
Admission: EM | Admit: 2020-02-23 | Discharge: 2020-02-23 | Disposition: A | Discharge status: Home / Self Care | Payer: BC Managed Care – PPO | Attending: Emergency Medicine | Admitting: Emergency Medicine

## 2020-02-23 DIAGNOSIS — R55 Syncope and collapse: Secondary | ICD-10-CM | POA: Diagnosis present

## 2020-02-23 DIAGNOSIS — R42 Dizziness and giddiness: Secondary | ICD-10-CM | POA: Diagnosis not present

## 2020-02-23 DIAGNOSIS — I959 Hypotension, unspecified: Secondary | ICD-10-CM | POA: Diagnosis not present

## 2020-02-23 DIAGNOSIS — F17210 Nicotine dependence, cigarettes, uncomplicated: Secondary | ICD-10-CM | POA: Insufficient documentation

## 2020-02-23 DIAGNOSIS — R079 Chest pain, unspecified: Secondary | ICD-10-CM | POA: Diagnosis not present

## 2020-02-23 DIAGNOSIS — E860 Dehydration: Secondary | ICD-10-CM | POA: Diagnosis not present

## 2020-02-23 LAB — CBC WITH DIFFERENTIAL/PLATELET
Abs Immature Granulocytes: 0.06 10*3/uL (ref 0.00–0.07)
Basophils Absolute: 0 10*3/uL (ref 0.0–0.1)
Basophils Relative: 0 %
Eosinophils Absolute: 0.2 10*3/uL (ref 0.0–0.5)
Eosinophils Relative: 1 %
HCT: 49.1 % (ref 39.0–52.0)
Hemoglobin: 16.4 g/dL (ref 13.0–17.0)
Immature Granulocytes: 1 %
Lymphocytes Relative: 13 %
Lymphs Abs: 1.5 10*3/uL (ref 0.7–4.0)
MCH: 33.1 pg (ref 26.0–34.0)
MCHC: 33.4 g/dL (ref 30.0–36.0)
MCV: 99 fL (ref 80.0–100.0)
Monocytes Absolute: 0.8 10*3/uL (ref 0.1–1.0)
Monocytes Relative: 7 %
Neutro Abs: 9.1 10*3/uL — ABNORMAL HIGH (ref 1.7–7.7)
Neutrophils Relative %: 78 %
Platelets: 207 10*3/uL (ref 150–400)
RBC: 4.96 MIL/uL (ref 4.22–5.81)
RDW: 11.9 % (ref 11.5–15.5)
WBC: 11.5 10*3/uL — ABNORMAL HIGH (ref 4.0–10.5)
nRBC: 0 % (ref 0.0–0.2)

## 2020-02-23 LAB — CBG MONITORING, ED: Glucose-Capillary: 97 mg/dL (ref 70–99)

## 2020-02-23 LAB — COMPREHENSIVE METABOLIC PANEL
ALT: 33 U/L (ref 0–44)
AST: 28 U/L (ref 15–41)
Albumin: 3.9 g/dL (ref 3.5–5.0)
Alkaline Phosphatase: 79 U/L (ref 38–126)
Anion gap: 14 (ref 5–15)
BUN: 12 mg/dL (ref 6–20)
CO2: 22 mmol/L (ref 22–32)
Calcium: 9.3 mg/dL (ref 8.9–10.3)
Chloride: 98 mmol/L (ref 98–111)
Creatinine, Ser: 1 mg/dL (ref 0.61–1.24)
GFR, Estimated: >60 mL/min (ref >60)
Glucose, Bld: 92 mg/dL (ref 70–99)
Potassium: 3.9 mmol/L (ref 3.5–5.1)
Sodium: 134 mmol/L — ABNORMAL LOW (ref 135–145)
Total Bilirubin: 0.6 mg/dL (ref 0.3–1.2)
Total Protein: 7.7 g/dL (ref 6.5–8.1)

## 2020-02-23 LAB — TROPONIN I (HIGH SENSITIVITY): Troponin I (High Sensitivity): 5 ng/L (ref <18)

## 2020-02-23 LAB — CK: Total CK: 54 U/L (ref 49–397)

## 2020-02-23 LAB — ETHANOL: Alcohol, Ethyl (B): 30 mg/dL — ABNORMAL HIGH (ref <10)

## 2020-02-23 MED ORDER — SODIUM CHLORIDE 0.9 % IV BOLUS
1000.0000 mL | Freq: Once | INTRAVENOUS | Status: AC
  Administered 2020-02-23: 1000 mL via INTRAVENOUS

## 2020-02-23 NOTE — ED Provider Notes (Signed)
[facility] EMERGENCY DEPARTMENT Provider Note   CSN: [id number] Arrival date & time: 02/23/20  2136     History No chief complaint on file.   George West is a 59 y.o. male with no known past medical history, who presented to the ED after a syncopal event.  Per patient, this event happened this afternoon at patient's cousin's cookout.  Patient states that after he ate he felt dizzy and walked to his truck where his wife witnessed his syncope.  Patient states that he was sweating profusely.  Patient's wife states that patient lost consciousness for about 3-4 minutes.  No seizure activities noted.  His wife states that patient systolic blood pressure was in the 80s-90s when EMS he checked his blood pressure.  Patient denies chest pain, headache, palpitation, facial drooping, slurred speech, extremity weakness, extremities pain, coughing, sore throat, runny nose..  Patient also denies head injury or other injuries.  Patient states that he does not eat anything all day until this afternoon.  He drinks about 3 coffee and 3 beers today.  Patient smoke about 1 pack a day and drink 3-6 beers a day.  Denies drug use.  Patient had a prior syncopal episode about 15 years ago and did not know the cause.  Patient report history of CVA of mother.  Denies past medical history of arrhythmia or heart attack.  No family history of heart problem.  Denies sick contact.  Patient was started on Cymbalta 3 days ago for his neuropathy.  Patient also endorses chronic shortness of breath.  HPI     No past medical history on file.  Patient Active Problem List   Diagnosis Date Noted  . Vasovagal syncope 02/23/2020  . Dehydration 02/23/2020  . Sensory loss 02/20/2020  . Mood disorder ([place]) 02/20/2020  . Encounter for screening fecal occult blood testing 08/30/2017  . Shortness of breath on exertion 08/30/2017  . Cough in adult 06/01/2017  . Vitamin D deficiency 12/02/2016  . Routine health  maintenance 11/30/2016  . Fatigue 11/30/2016  . Alcohol use disorder, moderate, dependence ([place]) 11/30/2016  . Tobacco use disorder 11/30/2016  . Lack of interest 11/30/2016    Past Surgical History:  Procedure Laterality Date  . WISDOM TOOTH EXTRACTION         Family History  Problem Relation Age of Onset  . Stroke Mother   . Dementia Mother   . Stroke Maternal Grandmother   . Breast cancer Sister   . Thyroid cancer Sister   . Breast cancer Paternal Grandmother     Social History   Tobacco Use  . Smoking status: Current Every Day Smoker    Packs/day: 1.00    Types: Cigarettes    Start date: 05/17/1981  . Smokeless tobacco: Never Used  Vaping Use  . Vaping Use: Never used  Substance Use Topics  . Alcohol use: Yes    Alcohol/week: 42.0 standard drinks    Types: 42 Cans of beer per week    Comment: 6 pack per day or comparable liquor  . Drug use: No    Home Medications Prior to Admission medications   Medication Sig Start Date End Date Taking? Authorizing Provider  DULoxetine (CYMBALTA) 30 MG capsule Take 1 capsule (30 mg total) by mouth daily. 02/20/20   [name], MD    Allergies    Patient has no known allergies.  Review of Systems   Review of Systems  Constitutional: Positive for diaphoresis.  Eyes: Negative for  visual disturbance.  Respiratory: Positive for shortness of breath. Negative for cough and chest tightness.   Cardiovascular: Negative for chest pain, palpitations and leg swelling.  Gastrointestinal: Positive for abdominal pain. Negative for diarrhea.  Genitourinary: Negative for dysuria and flank pain.  Musculoskeletal: Negative for gait problem.  Neurological: Positive for light-headedness. Negative for tremors, seizures and speech difficulty.    Physical Exam Updated Vital Signs BP (!) 153/95 (BP Location: Right Arm)   Pulse 71   Temp 98 F (36.7 C) (Oral)   Resp (!) 24   Ht 5\' 10"  (1.778 m)   Wt 78.5 kg   SpO2 97%   BMI  24.82 kg/m   Physical Exam Constitutional:      General: He is not in acute distress. HENT:     Head: Normocephalic.  Eyes:     General:        Right eye: No discharge.        Left eye: No discharge.     Extraocular Movements: Extraocular movements intact.     Conjunctiva/sclera: Conjunctivae normal.     Pupils: Pupils are equal, round, and reactive to light.  Cardiovascular:     Rate and Rhythm: Normal rate and regular rhythm.  Pulmonary:     Effort: Pulmonary effort is normal. No respiratory distress.     Breath sounds: Normal breath sounds. No wheezing.  Abdominal:     General: Bowel sounds are normal.     Tenderness: There is no abdominal tenderness. There is no right CVA tenderness or left CVA tenderness.     Comments: Tensed abdominal muscle  Musculoskeletal:        General: No tenderness. Normal range of motion.     Cervical back: Normal range of motion.     Right lower leg: No edema.     Left lower leg: No edema.  Skin:    Comments: Mild flushing.  Neurological:     Mental Status: He is alert.     Comments: PERRLA Cranial nerve non deficit Normal strength and sensation of bilateral upper and lower extremities Normal range of motion Normal reflexes   Psychiatric:        Mood and Affect: Mood normal.     ED Results / Procedures / Treatments   Labs (all labs ordered are listed, but only abnormal results are displayed) Labs Reviewed  CBC WITH DIFFERENTIAL/PLATELET - Abnormal; Notable for the following components:      Result Value   WBC 11.5 (*)    Neutro Abs 9.1 (*)    All other components within normal limits  COMPREHENSIVE METABOLIC PANEL - Abnormal; Notable for the following components:   Sodium 134 (*)    All other components within normal limits  ETHANOL - Abnormal; Notable for the following components:   Alcohol, Ethyl (B) 30 (*)    All other components within normal limits  CK  URINALYSIS, ROUTINE W REFLEX MICROSCOPIC  CBG MONITORING, ED    TROPONIN I (HIGH SENSITIVITY)    EKG EKG Interpretation  Date/Time:  Saturday February 23 2020 21:38:57 EDT Ventricular Rate:  69 PR Interval:    QRS Duration: 84 QT Interval:  402 QTC Calculation: 431 R Axis:   85 Text Interpretation: Sinus rhythm Borderline right axis deviation No previous ECGs available Confirmed by Wandra Arthurs ([id number]) on 02/23/2020 9:45:38 PM   Radiology DG Chest Port 1 View  Result Date: 02/23/2020 CLINICAL DATA:  Chest pain EXAM: PORTABLE CHEST 1 VIEW COMPARISON:  02/20/2020 FINDINGS:  The heart size and mediastinal contours are within normal limits. Both lungs are clear. The visualized skeletal structures are unremarkable. IMPRESSION: No active disease. Electronically Signed   By: Fidela Salisbury MD   On: 02/23/2020 22:35    Procedures Procedures (including critical care time)  Medications Ordered in ED Medications  sodium chloride 0.9 % bolus 1,000 mL (0 mLs Intravenous Stopped 02/23/20 2325)    ED Course  I have reviewed the triage vital signs and the nursing notes.  Pertinent labs & imaging results that were available during my care of the patient were reviewed by me and considered in my medical decision making (see chart for details).  Patient examined and seen.  Patient is in no acute distress.  Neuro exam was normal with no abnormalities in range of motion of 4 extremities.  Differentials include vasovagal and dehydration.  Low suspicion for ACS, PE, CVA or serotonin syndrome due to Cymbalta.  Obtain chest x-ray, CBC, CMP, troponin, CK, and ethanol level.  BP (!) 142/81   Pulse 75   Temp 98.2 F (36.8 C) (Oral)   Resp (!) 23   Ht 5\' 10"  (1.778 m)   Wt 78.5 kg   SpO2 96%   BMI 24.82 kg/m   Lab works came back reassuring.  CK and troponin came back within normal limits.  Chest x-ray is clear.  Patient is stable for discharge  Plan: Encourage patient to drink more fluids.  Information on vasovagal syncope provided.  Follow-up with PCP.  Come  back to the ED for additional episode of syncope or new neurological symptoms.    MDM Rules/Calculators/A&P                          Patient presents the ED with a witnessed syncopal episode.  No seizure activity observed by his wife.  His prodrome symptoms include lightheadedness and sweating.  Patient denies chest pain, chest pain, headache, palpitation, facial drooping, slurred speech, extremity weakness, extremities pain.  Patient also denies head injury or other injuries.  No past medical history of heart disease or arrhythmia.  No family of heart issue.  This is likely vasovagal with dehydration.  Low suspicion for ACS, PE, or CVA.  Patient is encouraged to hydrate appropriately and follow-up with his PCP.  Come back to the ED for additional episode of syncope or new neurological symptoms.    Final Clinical Impression(s) / ED Diagnoses Final diagnoses:  Vasovagal syncope  Dehydration    Rx / DC Orders ED Discharge Orders    None       Gaylan Gerold, DO 02/23/20 2344    Drenda Freeze, MD 02/27/20 2253

## 2020-02-23 NOTE — ED Triage Notes (Signed)
Pt arrives via [facility] for sudden onset dizziness and one syncopal episode per pt. EMS initial EKG showed ST elevation in leads V2,V3. Repeat EKGs showed less elevation per EMS.   Pt arrives A+Ox4, denies CP/SOB. Reports new medication Cymbalta started by PCP on Wednesday. Pt reports new SOB with exertion that began Wednesday.   18 g L AC. Pt given 324 ASA by EMS. Last VS 148/89, HR 75, RR18, 96% on RA, CBG 127

## 2020-02-23 NOTE — Discharge Instructions (Signed)
George West, it is a pleasure getting to know you today.  You came into the ED for an episode of loss of consciousness.  All of your lab work came back reassuring.  Chest x-ray is also clear.  This is likely a vasovagal response with dehydration.  Please drink plenty of fluids.  Please cut back on your smoking, you can reach out to your PCP for resources of smoking cessation.  Please come back to the ED for additional episode of passing out or new neurological symptoms.  Take care Dr. Alfonse Spruce

## 2020-02-24 LAB — VITAMIN B1: Vitamin B1 (Thiamine): <6 nmol/L — ABNORMAL LOW (ref 8–30)

## 2020-02-26 ENCOUNTER — Encounter: Payer: Self-pay | Admitting: Pulmonary Disease

## 2020-02-26 ENCOUNTER — Ambulatory Visit (INDEPENDENT_AMBULATORY_CARE_PROVIDER_SITE_OTHER): Payer: BC Managed Care – PPO | Admitting: Pulmonary Disease

## 2020-02-26 ENCOUNTER — Other Ambulatory Visit: Payer: Self-pay

## 2020-02-26 VITALS — BP 120/78 | HR 80 | Temp 97.4°F | Ht 70.0 in | Wt 179.0 lb

## 2020-02-26 DIAGNOSIS — J441 Chronic obstructive pulmonary disease with (acute) exacerbation: Secondary | ICD-10-CM

## 2020-02-26 DIAGNOSIS — R0609 Other forms of dyspnea: Secondary | ICD-10-CM

## 2020-02-26 DIAGNOSIS — F17200 Nicotine dependence, unspecified, uncomplicated: Secondary | ICD-10-CM

## 2020-02-26 DIAGNOSIS — R059 Cough, unspecified: Secondary | ICD-10-CM

## 2020-02-26 DIAGNOSIS — R0600 Dyspnea, unspecified: Secondary | ICD-10-CM

## 2020-02-26 LAB — PROTEIN ELECTROPHORESIS, SERUM, WITH REFLEX
Abnormal Protein Band1: 0.5 g/dL — ABNORMAL HIGH
Abnormal Protein Band2: 0.4 g/dL — ABNORMAL HIGH
Albumin ELP: 4.8 g/dL (ref 3.8–4.8)
Alpha 1: 0.3 g/dL (ref 0.2–0.3)
Alpha 2: 0.8 g/dL (ref 0.5–0.9)
Beta 2: 0.6 g/dL — ABNORMAL HIGH (ref 0.2–0.5)
Beta Globulin: 0.5 g/dL (ref 0.4–0.6)

## 2020-02-26 LAB — IFE INTERPRETATION: Immunofix Electr Int: DETECTED

## 2020-02-26 MED ORDER — IPRATROPIUM-ALBUTEROL 20-100 MCG/ACT IN AERS: 2.0000 | INHALATION_SPRAY | Freq: Four times a day (QID) | RESPIRATORY_TRACT | 3 refills | Status: DC

## 2020-02-26 NOTE — Progress Notes (Signed)
George West    [id number]    1960-12-13  Primary Care Physician:Letvak, [name], MD  Referring Physician: [name], MD [address] [place],  [place] [address]  Chief complaint:   Patient being seen for shortness of breath  HPI:  Shortness of breath on exertion Active smoker, pack a day smoker Has done so for over 35 years  No shortness of breath at rest  Limited with walking up steps Limited with about 500 feet  No PFT in the past  He seems aware that his smoking is contributing to his lung disease  Recently had evaluation in the emergency department for syncopal episode -Felt to have had a vasovagal episode  History of exposure to black mold when he was doing some remodeling recently  Outpatient Encounter Medications as of 02/26/2020  Medication Sig   DULoxetine (CYMBALTA) 30 MG capsule Take 1 capsule (30 mg total) by mouth daily.   thiamine 100 MG tablet Take 100 mg by mouth daily.   No facility-administered encounter medications on file as of 02/26/2020.    Allergies as of 02/26/2020   (No Known Allergies)    No past medical history on file.  Past Surgical History:  Procedure Laterality Date   WISDOM TOOTH EXTRACTION      Family History  Problem Relation Age of Onset   Stroke Mother    Dementia Mother    Stroke Maternal Grandmother    Breast cancer Sister    Thyroid cancer Sister    Breast cancer Paternal Grandmother     Social History   Socioeconomic History   Marital status: Married    Spouse name: Not on file   Number of children: 2   Years of education: Not on file   Highest education level: Not on file  Occupational History   Occupation: Public librarian    Comment: [employer]  Tobacco Use   Smoking status: Current Every Day Smoker    Packs/day: 1.00    Types: Cigarettes    Start date: 05/17/1981   Smokeless tobacco: Never Used  Vaping Use   Vaping Use: Never used   Substance and Sexual Activity   Alcohol use: Yes    Alcohol/week: 42.0 standard drinks    Types: 42 Cans of beer per week    Comment: 6 pack per day or comparable liquor   Drug use: No   Sexual activity: Yes    Partners: Female    Birth control/protection: None  Other Topics Concern   Not on file  Social History Narrative   Married   2 children--- 1 daughter/1 son   6 step children   Social Determinants of Health   Financial Resource Strain:    Difficulty of Paying Living Expenses: Not on file  Food Insecurity:    Worried About Charity fundraiser in the Last Year: Not on file   [employer] of Food in the Last Year: Not on file  Transportation Needs:    Lack of Transportation (Medical): Not on file   Lack of Transportation (Non-Medical): Not on file  Physical Activity:    Days of Exercise per Week: Not on file   Minutes of Exercise per Session: Not on file  Stress:    Feeling of Stress : Not on file  Social Connections:    Frequency of Communication with Friends and Family: Not on file   Frequency of Social Gatherings with Friends and Family:  Not on file   Attends Religious Services: Not on file   Active Member of Clubs or Organizations: Not on file   Attends Club or Organization Meetings: Not on file   Marital Status: Not on file  Intimate Partner Violence:    Fear of Current or Ex-Partner: Not on file   Emotionally Abused: Not on file   Physically Abused: Not on file   Sexually Abused: Not on file    Review of Systems  Respiratory: Positive for cough and shortness of breath.     Vitals:   02/26/20 1443  BP: 120/78  Pulse: 80  Temp: (!) 97.4 F (36.3 C)  SpO2: 97%     Physical Exam Constitutional:      Appearance: Normal appearance.  HENT:     Head: Normocephalic and atraumatic.     Nose: No congestion.     Mouth/Throat:     Pharynx: No oropharyngeal exudate or posterior oropharyngeal erythema.  Eyes:     General:        Right  eye: No discharge.        Left eye: No discharge.  Cardiovascular:     Rate and Rhythm: Normal rate and regular rhythm.     Heart sounds: No murmur heard.  No friction rub.  Pulmonary:     Effort: No respiratory distress.     Breath sounds: No stridor. No wheezing or rhonchi.  Musculoskeletal:     Cervical back: No rigidity or tenderness.  Neurological:     Mental Status: He is alert.  Psychiatric:        Mood and Affect: Mood normal.    Data Reviewed: Chest x-ray 02/23/2020 reviewed showing no acute infiltrate, hyperinflated lung fields  Assessment:  Obstructive lung disease  Shortness of breath on exertion  Active smoker  Smoking cessation counseling  Plan/Recommendations: Combivent inhaler for management of these shortness of breath  Referred to lung cancer screening program  Obtain echocardiogram for shortness of breath on exertion  Obtain a pulmonary function test  Sputum for Gram stain and cultures, fungal studies  Encouraged to call with significant concerns  Smoking cessation counseling   Sherrilyn Rist MD [facility] 02/26/2020, 3:01 PM  CC: [name], MD

## 2020-02-26 NOTE — Patient Instructions (Signed)
Shortness of breath on exertion This is likely related to underlying COPD  Obtain a pulmonary function test  Sputum for Gram stain and cultures, fungal studies  Echocardiogram for dyspnea on exertion  Refer lung cancer screening program  Call with any significant concerns  See you back in about 3 months  Prescription for Combivent will be sent to pharmacy for you-this can be used up to 4 times a day as needed for shortness of breath

## 2020-03-03 ENCOUNTER — Other Ambulatory Visit: Payer: Self-pay

## 2020-03-03 ENCOUNTER — Ambulatory Visit (HOSPITAL_COMMUNITY)
Admission: RE | Admit: 2020-03-03 | Discharge: 2020-03-03 | Disposition: A | Discharge status: Home / Self Care | Payer: BC Managed Care – PPO | Source: Ambulatory Visit | Attending: Pulmonary Disease | Admitting: Pulmonary Disease

## 2020-03-03 DIAGNOSIS — R0600 Dyspnea, unspecified: Secondary | ICD-10-CM | POA: Diagnosis not present

## 2020-03-03 DIAGNOSIS — R0609 Other forms of dyspnea: Secondary | ICD-10-CM

## 2020-03-03 LAB — ECHOCARDIOGRAM COMPLETE
Area-P 1/2: 3.91 cm2
S' Lateral: 2.9 cm

## 2020-03-03 NOTE — Progress Notes (Signed)
  Echocardiogram 2D Echocardiogram has been performed.  Jennette Dubin 03/03/2020, 2:48 PM

## 2020-03-05 ENCOUNTER — Telehealth: Payer: Self-pay | Admitting: Internal Medicine

## 2020-03-05 NOTE — Telephone Encounter (Signed)
Spouse returned call.

## 2020-03-06 NOTE — Telephone Encounter (Signed)
See lab notes

## 2020-03-18 ENCOUNTER — Other Ambulatory Visit (HOSPITAL_COMMUNITY)
Admission: RE | Admit: 2020-03-18 | Discharge: 2020-03-18 | Disposition: A | Discharge status: Home / Self Care | Payer: BC Managed Care – PPO | Source: Ambulatory Visit | Attending: Pulmonary Disease | Admitting: Pulmonary Disease

## 2020-03-18 DIAGNOSIS — Z01812 Encounter for preprocedural laboratory examination: Secondary | ICD-10-CM | POA: Insufficient documentation

## 2020-03-18 DIAGNOSIS — Z20822 Contact with and (suspected) exposure to covid-19: Secondary | ICD-10-CM | POA: Diagnosis not present

## 2020-03-18 LAB — SARS CORONAVIRUS 2 (TAT 6-24 HRS): SARS Coronavirus 2: NEGATIVE

## 2020-03-21 ENCOUNTER — Ambulatory Visit (INDEPENDENT_AMBULATORY_CARE_PROVIDER_SITE_OTHER): Payer: BC Managed Care – PPO | Admitting: Pulmonary Disease

## 2020-03-21 ENCOUNTER — Other Ambulatory Visit: Payer: Self-pay

## 2020-03-21 DIAGNOSIS — J441 Chronic obstructive pulmonary disease with (acute) exacerbation: Secondary | ICD-10-CM

## 2020-03-21 LAB — PULMONARY FUNCTION TEST
DL/VA % pred: 61 %
DL/VA: 2.6 ml/min/mmHg/L
DLCO cor % pred: 63 %
DLCO cor: 17.46 ml/min/mmHg
DLCO unc % pred: 66 %
DLCO unc: 18.28 ml/min/mmHg
FEF 25-75 Post: 2.5 L/sec
FEF 25-75 Pre: 1.02 L/sec
FEF2575-%Change-Post: 146 %
FEF2575-%Pred-Post: 84 %
FEF2575-%Pred-Pre: 34 %
FEV1-%Change-Post: 31 %
FEV1-%Pred-Post: 76 %
FEV1-%Pred-Pre: 58 %
FEV1-Post: 2.76 L
FEV1-Pre: 2.11 L
FEV1FVC-%Change-Post: 1 %
FEV1FVC-%Pred-Pre: 79 %
FEV6-%Change-Post: 30 %
FEV6-%Pred-Post: 99 %
FEV6-%Pred-Pre: 75 %
FEV6-Post: 4.52 L
FEV6-Pre: 3.46 L
FEV6FVC-%Change-Post: 0 %
FEV6FVC-%Pred-Post: 104 %
FEV6FVC-%Pred-Pre: 103 %
FVC-%Change-Post: 29 %
FVC-%Pred-Post: 94 %
FVC-%Pred-Pre: 73 %
FVC-Post: 4.53 L
FVC-Pre: 3.5 L
Post FEV1/FVC ratio: 61 %
Post FEV6/FVC ratio: 100 %
Pre FEV1/FVC ratio: 60 %
Pre FEV6/FVC Ratio: 99 %
RV % pred: 149 %
RV: 3.35 L
TLC % pred: 107 %
TLC: 7.52 L

## 2020-03-21 NOTE — Progress Notes (Signed)
Full PFT performed today. °

## 2020-03-24 ENCOUNTER — Other Ambulatory Visit: Payer: Self-pay

## 2020-03-24 ENCOUNTER — Encounter: Payer: Self-pay | Admitting: Internal Medicine

## 2020-03-24 ENCOUNTER — Ambulatory Visit (INDEPENDENT_AMBULATORY_CARE_PROVIDER_SITE_OTHER): Payer: BC Managed Care – PPO | Admitting: Internal Medicine

## 2020-03-24 VITALS — BP 136/62 | HR 87 | Temp 98.1°F | Resp 20 | Ht 70.0 in | Wt 181.4 lb

## 2020-03-24 DIAGNOSIS — R0602 Shortness of breath: Secondary | ICD-10-CM

## 2020-03-24 DIAGNOSIS — F1020 Alcohol dependence, uncomplicated: Secondary | ICD-10-CM | POA: Diagnosis not present

## 2020-03-24 DIAGNOSIS — F39 Unspecified mood [affective] disorder: Secondary | ICD-10-CM

## 2020-03-24 DIAGNOSIS — F17200 Nicotine dependence, unspecified, uncomplicated: Secondary | ICD-10-CM

## 2020-03-24 DIAGNOSIS — D472 Monoclonal gammopathy: Secondary | ICD-10-CM

## 2020-03-24 MED ORDER — BUPROPION HCL 100 MG PO TABS: 100.0000 mg | ORAL_TABLET | Freq: Two times a day (BID) | ORAL | 5 refills | Status: DC

## 2020-03-24 NOTE — Assessment & Plan Note (Signed)
Ongoing anxiety Didn't tolerate the duloxetine Will try bupropion ----to help him stop smoking and also help nerves and stop self medicating with alcohol

## 2020-03-24 NOTE — Patient Instructions (Signed)
Please try the inhaler at least 3 times a day to see if it helps your breathing. Start the bupropion (wellbutrin) at once a day for the first 7-10 days---then you should stop all smoking and not start again. Then increase to twice a day. If you have the urge to smoke--DON'T!!. You can use a nicotine lozenge or gum to get you over a rough spot.

## 2020-03-24 NOTE — Assessment & Plan Note (Signed)
Still drinking the same Hopefully will improve with new medication

## 2020-03-24 NOTE — Assessment & Plan Note (Signed)
Appears to have abnormal PFTs with some reversibility Asked him to try the inhaler--combivent If it doesn't help, may need to consider cardiology evaluation

## 2020-03-24 NOTE — Assessment & Plan Note (Signed)
Will proceed with hematology evaluation given his neuropathy

## 2020-03-24 NOTE — Assessment & Plan Note (Signed)
Will try bupropion

## 2020-03-24 NOTE — Progress Notes (Signed)
Subjective:    Patient ID: Ginger Carne, male    DOB: 11/03/1960, 59 y.o.   MRN: [id number]  HPI Here for follow up With wife This visit occurred during the SARS-CoV-2 public health emergency.  Safety protocols were in place, including screening questions prior to the visit, additional usage of staff PPE, and extensive cleaning of exam room while observing appropriate contact time as indicated for disinfecting solutions.   Saw pulmonologist Just had PFTs---results still pending Hasn't started the combivent ---discussed Plans to stop after he finishes the 4 packs he has now Wife notes he has cut down (and she will plans to quit with him)  Tried the duloxetine but stopped it a few days ago Feels it makes him dizzy---even when he tried it every other day  Still drinking the same Hasn't altered this --over 30 years  Current Outpatient Medications on File Prior to Visit  Medication Sig Dispense Refill  . Ipratropium-Albuterol (COMBIVENT) 20-100 MCG/ACT AERS respimat Inhale 2 puffs into the lungs every 6 (six) hours. 4 g 3  . thiamine 100 MG tablet Take 100 mg by mouth daily.     No current facility-administered medications on file prior to visit.    Allergies  Allergen Reactions  . Duloxetine     dizziness    History reviewed. No pertinent past medical history.  Past Surgical History:  Procedure Laterality Date  . WISDOM TOOTH EXTRACTION      Family History  Problem Relation Age of Onset  . Stroke Mother   . Dementia Mother   . Stroke Maternal Grandmother   . Breast cancer Sister   . Thyroid cancer Sister   . Breast cancer Paternal Grandmother     Social History   Socioeconomic History  . Marital status: Married    Spouse name: Not on file  . Number of children: 2  . Years of education: Not on file  . Highest education level: Not on file  Occupational History  . Occupation: Public librarian    Comment: [employer]  Tobacco Use  . Smoking  status: Current Every Day Smoker    Packs/day: 1.00    Types: Cigarettes    Start date: 05/17/1981  . Smokeless tobacco: Never Used  Vaping Use  . Vaping Use: Never used  Substance and Sexual Activity  . Alcohol use: Yes    Alcohol/week: 42.0 standard drinks    Types: 42 Cans of beer per week    Comment: 6 pack per day or comparable liquor  . Drug use: No  . Sexual activity: Yes    Partners: Female    Birth control/protection: None  Other Topics Concern  . Not on file  Social History Narrative   Married   2 children--- 1 daughter/1 son   6 step children   Social Determinants of Health   Financial Resource Strain:   . Difficulty of Paying Living Expenses: Not on file  Food Insecurity:   . Worried About Charity fundraiser in the Last Year: Not on file  . Ran Out of Food in the Last Year: Not on file  Transportation Needs:   . Lack of Transportation (Medical): Not on file  . Lack of Transportation (Non-Medical): Not on file  Physical Activity:   . Days of Exercise per Week: Not on file  . Minutes of Exercise per Session: Not on file  Stress:   . Feeling of Stress : Not on file  Social Connections:   . Frequency  of Communication with Friends and Family: Not on file  . Frequency of Social Gatherings with Friends and Family: Not on file  . Attends Religious Services: Not on file  . Active Member of Clubs or Organizations: Not on file  . Attends Archivist Meetings: Not on file  . Marital Status: Not on file  Intimate Partner Violence:   . Fear of Current or Ex-Partner: Not on file  . Emotionally Abused: Not on file  . Physically Abused: Not on file  . Sexually Abused: Not on file   Review of Systems Not sleeping well--nothing new (but sleeping a lot lately). Sleeping most of the weekends lately (because of being tired) Appetite is not great--eats once a day    Objective:   Physical Exam Cardiovascular:     Rate and Rhythm: Normal rate and regular rhythm.       Heart sounds: No murmur heard.  No gallop.   Pulmonary:     Effort: Pulmonary effort is normal.     Breath sounds: No wheezing or rales.     Comments: Decreased breath sounds but clear Musculoskeletal:     Cervical back: Neck supple.  Lymphadenopathy:     Cervical: No cervical adenopathy.  Psychiatric:     Comments: Flippant in his answers Not depressed            Assessment & Plan:

## 2020-03-28 ENCOUNTER — Telehealth: Payer: Self-pay | Admitting: Hematology

## 2020-03-28 NOTE — Telephone Encounter (Signed)
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 George West has been scheduled to see Dr. Irene Limbo on 12/8 at 1pm. Appt date and time has been given to the pt's wife. Aware to aware 30 minutes early.

## 2020-04-04 ENCOUNTER — Telehealth: Payer: Self-pay | Admitting: Internal Medicine

## 2020-04-04 ENCOUNTER — Telehealth: Payer: Self-pay | Admitting: Pulmonary Disease

## 2020-04-04 NOTE — Telephone Encounter (Signed)
Left message for patient's wife to call back.  

## 2020-04-07 NOTE — Telephone Encounter (Signed)
ATC pt. No option for VM. WCB.  

## 2020-04-07 NOTE — Telephone Encounter (Signed)
Nothing noted in message. Encounter created in error. Will close.

## 2020-04-08 ENCOUNTER — Other Ambulatory Visit: Payer: Self-pay | Admitting: *Deleted

## 2020-04-08 DIAGNOSIS — F17210 Nicotine dependence, cigarettes, uncomplicated: Secondary | ICD-10-CM

## 2020-04-14 ENCOUNTER — Telehealth: Payer: Self-pay | Admitting: Hematology

## 2020-04-14 NOTE — Telephone Encounter (Signed)
I received a call from the pt's wife to r/s his new hem appt to 12/22 at 1pm to see Dr. Irene Limbo

## 2020-04-15 NOTE — Telephone Encounter (Signed)
Lmtcb for pt.  

## 2020-04-23 ENCOUNTER — Encounter: Payer: BC Managed Care – PPO | Admitting: Hematology

## 2020-05-05 NOTE — Telephone Encounter (Signed)
LMTCB for pt.  Will close encounter due to several unsuccessful attempts to reach pt. Per triage protocol, encounter will be closed.

## 2020-05-06 ENCOUNTER — Telehealth: Payer: Self-pay | Admitting: Acute Care

## 2020-05-06 ENCOUNTER — Telehealth: Payer: Self-pay | Admitting: Hematology

## 2020-05-06 NOTE — Telephone Encounter (Signed)
Spoke with pt's wife. She states pt has had a death in the family and needs to cancel [facility] and CT for 05/05/20. Appts cancelled. Advised that I will call back after the first of the year to reschedule. She verbalized understanding. Nothing further needed

## 2020-05-06 NOTE — Telephone Encounter (Signed)
Pt's wife cld to reschedule her husband's appt w/Dr. Irene Limbo on 06-22-2022 at 10am due to death in the family.

## 2020-05-07 ENCOUNTER — Encounter: Payer: BC Managed Care – PPO | Admitting: Hematology

## 2020-05-07 ENCOUNTER — Encounter: Payer: BC Managed Care – PPO | Admitting: Acute Care

## 2020-05-07 ENCOUNTER — Inpatient Hospital Stay: Admission: RE | Admit: 2020-05-07 | Payer: BC Managed Care – PPO | Source: Ambulatory Visit

## 2020-05-21 ENCOUNTER — Encounter: Payer: Self-pay | Admitting: Internal Medicine

## 2020-05-21 ENCOUNTER — Ambulatory Visit (INDEPENDENT_AMBULATORY_CARE_PROVIDER_SITE_OTHER): Payer: BC Managed Care – PPO | Admitting: Internal Medicine

## 2020-05-21 ENCOUNTER — Other Ambulatory Visit: Payer: Self-pay

## 2020-05-21 DIAGNOSIS — F1020 Alcohol dependence, uncomplicated: Secondary | ICD-10-CM | POA: Diagnosis not present

## 2020-05-21 DIAGNOSIS — J449 Chronic obstructive pulmonary disease, unspecified: Secondary | ICD-10-CM | POA: Insufficient documentation

## 2020-05-21 DIAGNOSIS — J441 Chronic obstructive pulmonary disease with (acute) exacerbation: Secondary | ICD-10-CM | POA: Diagnosis not present

## 2020-05-21 DIAGNOSIS — F17200 Nicotine dependence, unspecified, uncomplicated: Secondary | ICD-10-CM | POA: Diagnosis not present

## 2020-05-21 MED ORDER — PREDNISONE 20 MG PO TABS: 40.0000 mg | ORAL_TABLET | Freq: Every day | ORAL | 0 refills | Status: DC

## 2020-05-21 NOTE — Assessment & Plan Note (Signed)
Drinking less since on the bupropion

## 2020-05-21 NOTE — Assessment & Plan Note (Signed)
PFTs show obstructive and restrictive lung disease with some reversibility Now with wheezing and tight Will treat with prednisone Needs to see Dr Wynona Neat to adjust Rx otherwise

## 2020-05-21 NOTE — Progress Notes (Signed)
Subjective:    Patient ID: George West, male    DOB: 27-Nov-1960, 60 y.o.   MRN: [id number]  HPI Here for follow up of SOB and other medical conditions With wife This visit occurred during the SARS-CoV-2 public health emergency.  Safety protocols were in place, including screening questions prior to the visit, additional usage of staff PPE, and extensive cleaning of exam room while observing appropriate contact time as indicated for disinfecting solutions.   Some cough--goes back a week Same SOB Has started the combivent---not clear it has helped Would choke him at first----now used to it Cough with clear mucus  Is taking the bupropion Has cut back on the smoking some---only 4 cigarettes a day  Has been drinking less  Current Outpatient Medications on File Prior to Visit  Medication Sig Dispense Refill  . buPROPion (WELLBUTRIN) 100 MG tablet Take 1 tablet (100 mg total) by mouth 2 (two) times daily. 60 tablet 5  . Ipratropium-Albuterol (COMBIVENT) 20-100 MCG/ACT AERS respimat Inhale 2 puffs into the lungs every 6 (six) hours. 4 g 3  . thiamine 100 MG tablet Take 100 mg by mouth daily.     No current facility-administered medications on file prior to visit.    Allergies  Allergen Reactions  . Duloxetine     dizziness    History reviewed. No pertinent past medical history.  Past Surgical History:  Procedure Laterality Date  . WISDOM TOOTH EXTRACTION      Family History  Problem Relation Age of Onset  . Stroke Mother   . Dementia Mother   . Stroke Maternal Grandmother   . Breast cancer Sister   . Thyroid cancer Sister   . Breast cancer Paternal Grandmother     Social History   Socioeconomic History  . Marital status: Married    Spouse name: Not on file  . Number of children: 2  . Years of education: Not on file  . Highest education level: Not on file  Occupational History  . Occupation: Public librarian    Comment: [employer]  Tobacco  Use  . Smoking status: Current Every Day Smoker    Packs/day: 1.00    Types: Cigarettes    Start date: 05/17/1981  . Smokeless tobacco: Never Used  Vaping Use  . Vaping Use: Never used  Substance and Sexual Activity  . Alcohol use: Yes    Alcohol/week: 42.0 standard drinks    Types: 42 Cans of beer per week    Comment: 6 pack per day or comparable liquor  . Drug use: No  . Sexual activity: Yes    Partners: Female    Birth control/protection: None  Other Topics Concern  . Not on file  Social History Narrative   Married   2 children--- 1 daughter/1 son   6 step children   Social Determinants of Radio broadcast assistant Strain: Not on file  Food Insecurity: Not on file  Transportation Needs: Not on file  Physical Activity: Not on file  Stress: Not on file  Social Connections: Not on file  Intimate Partner Violence: Not on file  'Review of Systems Had death in family---had to push back appts Due for CT for lung cancer screening and pulmonary follow up No fever No loss of smell or taste Vomited once last week--due to cough Niece also with respiratory symptoms---negative for COVID No loss of smell or taste     Objective:   Physical Exam Constitutional:  Appearance: Normal appearance.     Comments: occ cough  HENT:     Mouth/Throat:     Pharynx: No oropharyngeal exudate or posterior oropharyngeal erythema.  Neck:     Comments: Bilateral anterior cervical nodes Cardiovascular:     Rate and Rhythm: Normal rate and regular rhythm.     Heart sounds: No murmur heard. No gallop.   Pulmonary:     Effort: Pulmonary effort is normal.     Breath sounds: No rales.     Comments: Decreased breath sounds  Mild increased expiratory phase and wheezing Musculoskeletal:     Cervical back: Neck supple.     Right lower leg: No edema.     Left lower leg: No edema.  Neurological:     Mental Status: He is alert.            Assessment & Plan:

## 2020-05-21 NOTE — Assessment & Plan Note (Addendum)
Significant reduction with bupropion Urged him to stop completely Is going to set up CT for lung cancer screening

## 2020-05-26 ENCOUNTER — Telehealth: Payer: Self-pay

## 2020-05-26 NOTE — Telephone Encounter (Signed)
Patient's wife called to inform that he has a mild cough. He has hx. of COPD. PCP said he is fine nothing going on and placed pt. on Prednisone and cough has gotten better. She wanted to see if it was  okay for pt. to come in still tomorrow for his NEW HEM appt. with you at 1000  Per Dr. Irene Limbo pt. needs negative COVID test result to be seen or be rescheduled in 3 weeks.  Pt's wife requested to be rescheduled in 3 weeks.  Scheduling message sent to reschedule appt.

## 2020-05-27 ENCOUNTER — Encounter: Payer: BC Managed Care – PPO | Admitting: Hematology

## 2020-05-27 ENCOUNTER — Telehealth: Payer: Self-pay | Admitting: Acute Care

## 2020-05-27 ENCOUNTER — Telehealth: Payer: Self-pay | Admitting: Hematology

## 2020-05-27 NOTE — Telephone Encounter (Signed)
Mr. George West has been rescheduled to see Dr. Irene Limbo on 1/31 at 1pm. Appt date and time has been given to the pt's wife.

## 2020-05-28 ENCOUNTER — Telehealth: Payer: Self-pay | Admitting: Pulmonary Disease

## 2020-05-28 ENCOUNTER — Other Ambulatory Visit: Payer: Self-pay | Admitting: Pulmonary Disease

## 2020-05-28 MED ORDER — AZITHROMYCIN 250 MG PO TABS: ORAL_TABLET | ORAL | 0 refills | Status: DC

## 2020-05-28 NOTE — Telephone Encounter (Signed)
Spoke with [name] and notified of response per Dr Ander Slade and she verbalized understanding  Will call if the pt is not improving

## 2020-05-28 NOTE — Telephone Encounter (Signed)
He can get Mucinex-to be used twice a day  prednisone would usually help with swelling and irritation in the airway  will call in a prescription for azithromycin if he wants to fill that-antibiotic, also does help some swelling in the airway  complete course of steroids

## 2020-05-28 NOTE — Progress Notes (Signed)
Prescription for azithromycin sent to [facility]

## 2020-05-28 NOTE — Telephone Encounter (Addendum)
Spoke to patient, who reports of productive cough with clear sputum, wheezing and increase sob with coughing spell. Sx developed on 05/14/2020.  Denied fever, chills, sweats or additional symptoms. He has not had covid vaccines. He is using combivent 2 puffs BID with no relief.  PCP prescribed prednisone for 10 days. He has 2 days left of course. He has not noticed any difference in sx with prednisone.  Patient is questioning if any OTC medication can be taken to help with his sx.  Dr. Ander Slade, please advise. Thanks

## 2020-05-28 NOTE — Telephone Encounter (Signed)
Spoke with pt's wife and r/s lung screening appt for 06/23/20.  CT will be rescheduled Nothing further needed at this time.

## 2020-06-15 NOTE — Progress Notes (Signed)
HEMATOLOGY/ONCOLOGY CONSULTATION NOTE  Date of Service: 06/15/2020  Patient Care Team: [name], MD as PCP - General (Internal Medicine)  CHIEF COMPLAINTS/PURPOSE OF CONSULTATION:  Monoclonal Gammopathy  HISTORY OF PRESENTING ILLNESS:   George West is a wonderful 60 y.o. male who has been referred to [place] by 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 The pt reports that he is doing well overall. We are joined today by his wife.  The pt reports that he originally presented in October 2021 with SOB that had worsened recently. [name] had concerns of neuropathy, which led to all the labs done and signs of abnormal proteins. The protein is on Thymine, but denies Vitamin B12. 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 This numbness is accompanied by burning sensation, purple in color,intermittent swelling, but the pt denies cramping. The pt denies tingling/numbness in hands, pinched nerves in the back. He does note that he heard a popped in his back around two months ago, which immobilized him for a week. This did not affect his neuropathy. The pt notes he also has recently been experiencing lightheadedness/dizziness, which makes him have to stand still. He denies feelings of spinning around and notes this most often occurs after standing up.   The pt notes that he experienced a vasovagal syncope in October 2021, which lasted around 2-3 minutes and led to an ED visit. The pt's SOB occurs after walking short distances. The Pulmonologist got lung testing and an ECHO performed in November, which showed tight airways and decreased performance. 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 The ECHO showed normal function of heart. The pt notes he is on an inhaler (Combivent) but notes minimal help. The pt reports that he smokes 1/2 pack a day currently, down from 1 pack daily.  The pt notes  that his primary driver for having to stop walking or doing activity is his SOB. The neuropathy is primary cause once sitting down for extended periods of time. The pt denies any problems with falling or imbalance. He denies using a cane/walking stick.  The pt notes that he has had an enlarged lymph node in the neck area one year ago and has remain unchanged since then. He also notes a large bump in his upper back right. The pt notes his brother also has this.  The pt notes that he drinks a case of beer a week currently. Over five years ago, the pt notes that he drank two cases a week and a fifth of hard liquor a week in addition.Th pt notes that he has not drank water since he was a teenager, as he only drinks tea, coffee, beer, and liquor.  02/20/2020 IFE showed an IGM Kappa Monoclonal Protein detected.  02/20/2020 Serum protein electrophoresis showed two abnormal protein bands of 0.5 and 0.4.   On review of systems, pt reports SOB, neuropathy of lower extremities, lightheadedness/dizziness and denies muscle weakness, n/v/d, abdominal pain and any other symptoms.   MEDICAL HISTORY:  No past medical history on file.  SURGICAL HISTORY: Past Surgical History:  Procedure Laterality Date  . WISDOM TOOTH EXTRACTION      SOCIAL HISTORY: Social History   Socioeconomic History  . Marital status: Married    Spouse name: Not on file  . Number of children: 2  . Years of education: Not on file  . Highest education level: Not on file  Occupational History  . Occupation: Public librarian    Comment: [employer]  Tobacco Use  . Smoking status: Current Every Day Smoker    Packs/day: 1.00    Types: Cigarettes    Start date: 05/17/1981  . Smokeless tobacco: Never Used  Vaping Use  . Vaping Use: Never used  Substance and Sexual Activity  . Alcohol use: Yes    Alcohol/week: 42.0 standard drinks    Types: 42 Cans of beer per week    Comment: 6 pack per day or comparable liquor   . Drug use: No  . Sexual activity: Yes    Partners: Female    Birth control/protection: None  Other Topics Concern  . Not on file  Social History Narrative   Married   2 children--- 1 daughter/1 son   6 step children   Social Determinants of Radio broadcast assistant Strain: Not on file  Food Insecurity: Not on file  Transportation Needs: Not on file  Physical Activity: Not on file  Stress: Not on file  Social Connections: Not on file  Intimate Partner Violence: Not on file    FAMILY HISTORY: Family History  Problem Relation Age of Onset  . Stroke Mother   . Dementia Mother   . Stroke Maternal Grandmother   . Breast cancer Sister   . Thyroid cancer Sister   . Breast cancer Paternal Grandmother     ALLERGIES:  is allergic to duloxetine.  MEDICATIONS:  Current Outpatient Medications  Medication Sig Dispense Refill  . azithromycin (ZITHROMAX Z-PAK) 250 MG tablet Take 2 tablets day 1 and daily for the next 4 days 6 tablet 0  . buPROPion (WELLBUTRIN) 100 MG tablet Take 1 tablet (100 mg total) by mouth 2 (two) times daily. 60 tablet 5  . Ipratropium-Albuterol (COMBIVENT) 20-100 MCG/ACT AERS respimat Inhale 2 puffs into the lungs every 6 (six) hours. 4 g 3  . predniSONE (DELTASONE) 20 MG tablet Take 2 tablets (40 mg total) by mouth daily. For 5 days, then 1 tab daily for 5 days 15 tablet 0  . thiamine 100 MG tablet Take 100 mg by mouth daily.     No current facility-administered medications for this visit.    REVIEW OF SYSTEMS:    10 Point review of Systems was done is negative except as noted above.  PHYSICAL EXAMINATION: ECOG PERFORMANCE STATUS: 1 - Symptomatic but completely ambulatory  . Vitals:   06/16/20 1307  BP: 132/80  Pulse: 87  Resp: 20  Temp: 97.8 F (36.6 C)  SpO2: 96%   Filed Weights   06/16/20 1307  Weight: 175 lb 1.6 oz (79.4 kg)   .Body mass index is 25.12 kg/m.   GENERAL:alert, in no acute distress and comfortable SKIN: no acute  rashes, no significant lesions EYES: conjunctiva are pink and non-injected, sclera anicteric OROPHARYNX: MMM, no exudates, no oropharyngeal erythema or ulceration NECK: supple, no JVD LYMPH:  no palpable lymphadenopathy in the axillary or inguinal regions. Right midneck cervical lymph node 2-3cm.  LUNGS: clear to auscultation b/l with normal respiratory effort HEART: regular rate & rhythm ABDOMEN:  normoactive bowel sounds , non tender, not distended. Extremity: no pedal edema PSYCH: alert & oriented x 3 with fluent speech NEURO: no focal motor/sensory deficits  LABORATORY DATA:  I have reviewed the data as listed  . CBC Latest Ref Rng & Units 06/16/2020 02/23/2020 02/20/2020  WBC 4.0 - 10.5 K/uL 6.6 11.5(H) 7.1  Hemoglobin 13.0 - 17.0 g/dL 16.2 16.4  17.9(H)  Hematocrit 39.0 - 52.0 % 48.5 49.1 52.3(H)  Platelets 150 - 400 K/uL 201 207 221.0    . CMP Latest Ref Rng & Units 06/16/2020 02/23/2020 02/20/2020  Glucose 70 - 99 mg/dL 92 92 -  BUN 6 - 20 mg/dL 11 12 -  Creatinine 0.61 - 1.24 mg/dL 0.88 1.00 -  Sodium 135 - 145 mmol/L 139 134(L) -  Potassium 3.5 - 5.1 mmol/L 4.6 3.9 -  Chloride 98 - 111 mmol/L 104 98 -  CO2 22 - 32 mmol/L 28 22 -  Calcium 8.9 - 10.3 mg/dL 9.6 9.3 -  Total Protein 6.5 - 8.1 g/dL 8.1 7.7 8.5(H)  Total Bilirubin 0.3 - 1.2 mg/dL 1.0 0.6 -  Alkaline Phos 38 - 126 U/L 98 79 -  AST 15 - 41 U/L 17 28 -  ALT 0 - 44 U/L 21 33 -     RADIOGRAPHIC STUDIES: I have personally reviewed the radiological images as listed and agreed with the findings in the report. No results found.  ASSESSMENT & PLAN:   60 yo with   1) IgM Kappa monoclonal paraproteinemia  ? 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 -Discussed pt's Thiamine deficiency as caused by history of ETOH drinking.  -Discussed pt's lab results regarding two abnormal proteins present. Advised pt this  could be caused by vitamin deficiency or chronic alcohol intake. -Discussed Multiple Myeloma and Lymphomas that could be causing the abnormal levels of protein. Recommend pt get labs to characterize the types of abnormal proteins. This will determine evaluation. -Recommend pt f/u w Pulmonologist regarding medication change to a longer acting inhaler in addition rescue inhaler.  -Recommend pt drink 48-64 oz water daily. Hydration criteria of non-alcoholic, non-carbonated. Recommend pt try to use flavoring drops in water. -Advised pt that if his oxygen levels decrease below 90%, then he would require oxygen.  -Advised pt that alcoholic intake is most likely driver for neuropathy and increase in abnormal protein levels. -Recommend pt f/u w Dr. Silvio Pate regarding nerve conduction studies and further neuropathy testing to observe if neuropathy is due to alcohol or vitamin deficiency. -Recommend pt use walking stick for guiding walking and reducing risk of falls by 50% to help send signals to brain regarding movement and balance functions. -Will get labs today. Recommend pt get PET/CT following labs if needed. -Will see back in 1 week via phone.   FOLLOW UP Labs today Phone visit with Dr Irene Limbo in 1 week  All of the patients questions were answered with apparent satisfaction. The patient knows to call the clinic with any problems, questions or concerns.  I spent 45 minutes counseling the patient face to face. The total time spent in the appointment was 60 minutes and more than 50% was on counseling and direct patient cares.    Sullivan Lone MD [place] AAHIVMS [facility] Hematology/Oncology Physician [facility]  (Office):       [phone] (Work cell):  [phone] (Fax):           [phone]  06/15/2020 10:26 AM  I, Reinaldo Raddle, am acting as scribe for Dr. Sullivan Lone, MD.   .I have reviewed the above documentation for accuracy and completeness, and I agree with the above. Brunetta Genera MD

## 2020-06-16 ENCOUNTER — Ambulatory Visit (INDEPENDENT_AMBULATORY_CARE_PROVIDER_SITE_OTHER): Payer: BC Managed Care – PPO | Admitting: Pulmonary Disease

## 2020-06-16 ENCOUNTER — Inpatient Hospital Stay: Payer: BC Managed Care – PPO

## 2020-06-16 ENCOUNTER — Encounter: Payer: Self-pay | Admitting: Pulmonary Disease

## 2020-06-16 ENCOUNTER — Other Ambulatory Visit: Payer: Self-pay

## 2020-06-16 ENCOUNTER — Inpatient Hospital Stay: Payer: BC Managed Care – PPO | Attending: Hematology | Admitting: Hematology

## 2020-06-16 VITALS — BP 126/66 | HR 82 | Ht 70.0 in | Wt 176.8 lb

## 2020-06-16 VITALS — BP 132/80 | HR 87 | Temp 97.8°F | Resp 20 | Ht 70.0 in | Wt 175.1 lb

## 2020-06-16 DIAGNOSIS — D472 Monoclonal gammopathy: Secondary | ICD-10-CM | POA: Insufficient documentation

## 2020-06-16 DIAGNOSIS — Z79899 Other long term (current) drug therapy: Secondary | ICD-10-CM | POA: Diagnosis not present

## 2020-06-16 DIAGNOSIS — Z808 Family history of malignant neoplasm of other organs or systems: Secondary | ICD-10-CM | POA: Diagnosis not present

## 2020-06-16 DIAGNOSIS — J441 Chronic obstructive pulmonary disease with (acute) exacerbation: Secondary | ICD-10-CM

## 2020-06-16 DIAGNOSIS — G629 Polyneuropathy, unspecified: Secondary | ICD-10-CM | POA: Diagnosis not present

## 2020-06-16 DIAGNOSIS — E519 Thiamine deficiency, unspecified: Secondary | ICD-10-CM | POA: Insufficient documentation

## 2020-06-16 DIAGNOSIS — F17210 Nicotine dependence, cigarettes, uncomplicated: Secondary | ICD-10-CM | POA: Insufficient documentation

## 2020-06-16 DIAGNOSIS — Z803 Family history of malignant neoplasm of breast: Secondary | ICD-10-CM

## 2020-06-16 DIAGNOSIS — Z7289 Other problems related to lifestyle: Secondary | ICD-10-CM | POA: Insufficient documentation

## 2020-06-16 LAB — CBC WITH DIFFERENTIAL/PLATELET
Abs Immature Granulocytes: 0.01 10*3/uL (ref 0.00–0.07)
Basophils Absolute: 0.1 10*3/uL (ref 0.0–0.1)
Basophils Relative: 1 %
Eosinophils Absolute: 0.2 10*3/uL (ref 0.0–0.5)
Eosinophils Relative: 3 %
HCT: 48.5 % (ref 39.0–52.0)
Hemoglobin: 16.2 g/dL (ref 13.0–17.0)
Immature Granulocytes: 0 %
Lymphocytes Relative: 28 %
Lymphs Abs: 1.8 10*3/uL (ref 0.7–4.0)
MCH: 33 pg (ref 26.0–34.0)
MCHC: 33.4 g/dL (ref 30.0–36.0)
MCV: 98.8 fL (ref 80.0–100.0)
Monocytes Absolute: 0.7 10*3/uL (ref 0.1–1.0)
Monocytes Relative: 10 %
Neutro Abs: 3.9 10*3/uL (ref 1.7–7.7)
Neutrophils Relative %: 58 %
Platelets: 201 10*3/uL (ref 150–400)
RBC: 4.91 MIL/uL (ref 4.22–5.81)
RDW: 12.1 % (ref 11.5–15.5)
WBC: 6.6 10*3/uL (ref 4.0–10.5)
nRBC: 0 % (ref 0.0–0.2)

## 2020-06-16 LAB — CMP (CANCER CENTER ONLY)
ALT: 21 U/L (ref 0–44)
AST: 17 U/L (ref 15–41)
Albumin: 4.3 g/dL (ref 3.5–5.0)
Alkaline Phosphatase: 98 U/L (ref 38–126)
Anion gap: 7 (ref 5–15)
BUN: 11 mg/dL (ref 6–20)
CO2: 28 mmol/L (ref 22–32)
Calcium: 9.6 mg/dL (ref 8.9–10.3)
Chloride: 104 mmol/L (ref 98–111)
Creatinine: 0.88 mg/dL (ref 0.61–1.24)
GFR, Estimated: >60 mL/min (ref >60)
Glucose, Bld: 92 mg/dL (ref 70–99)
Potassium: 4.6 mmol/L (ref 3.5–5.1)
Sodium: 139 mmol/L (ref 135–145)
Total Bilirubin: 1 mg/dL (ref 0.3–1.2)
Total Protein: 8.1 g/dL (ref 6.5–8.1)

## 2020-06-16 LAB — SEDIMENTATION RATE: Sed Rate: 18 mm/hr — ABNORMAL HIGH (ref 0–16)

## 2020-06-16 LAB — LACTATE DEHYDROGENASE: LDH: 144 U/L (ref 98–192)

## 2020-06-16 LAB — RETICULOCYTES
Immature Retic Fract: 7.4 % (ref 2.3–15.9)
RBC.: 4.89 MIL/uL (ref 4.22–5.81)
Retic Count, Absolute: 43.5 10*3/uL (ref 19.0–186.0)
Retic Ct Pct: 0.9 % (ref 0.4–3.1)

## 2020-06-16 MED ORDER — TRELEGY ELLIPTA 100-62.5-25 MCG/INH IN AEPB: 1.0000 | INHALATION_SPRAY | Freq: Every day | RESPIRATORY_TRACT | 6 refills | Status: DC

## 2020-06-16 MED ORDER — TRELEGY ELLIPTA 100-62.5-25 MCG/INH IN AEPB: 1.0000 | INHALATION_SPRAY | Freq: Every day | RESPIRATORY_TRACT | 0 refills | Status: DC

## 2020-06-16 MED ORDER — ALBUTEROL SULFATE HFA 108 (90 BASE) MCG/ACT IN AERS: 2.0000 | INHALATION_SPRAY | RESPIRATORY_TRACT | 6 refills | Status: DC | PRN

## 2020-06-16 NOTE — Patient Instructions (Signed)
Will switch you to Trelegy-once a day Albuterol to be used as rescue  Sputum for cultures, fungal studies  Follow-up in 6 months  Call with significant concerns

## 2020-06-16 NOTE — Progress Notes (Signed)
George West    [id number]    Apr 01, 1961  Primary Care Physician:Letvak, [name], MD  Referring Physician: [name], MD [address] [place],  [place] [address]  Chief complaint:   Follow-up for shortness of breath  HPI: Shortness of breath on exertion  Has been using Combivent -Has not noticed any significant difference with his breathing with using Combivent  Has cut down on his smoking He does have shortness of breath on exertion  Limited with walking up steps Limited with about 500 feet  Recent PFT reviewed with the patient showing moderate obstructive disease with significant bronchodilator response  He seems aware that his smoking is contributing to his lung disease  Recently had evaluation in the emergency department for syncopal episode -Felt to have had a vasovagal episode  Clarified that he was exposed to black mold over the course of up to 3 years  Outpatient Encounter Medications as of 06/16/2020  Medication Sig  . buPROPion (WELLBUTRIN) 100 MG tablet Take 1 tablet (100 mg total) by mouth 2 (two) times daily.  . Ipratropium-Albuterol (COMBIVENT) 20-100 MCG/ACT AERS respimat Inhale 2 puffs into the lungs every 6 (six) hours.  . thiamine 100 MG tablet Take 100 mg by mouth daily.  Marland Kitchen azithromycin (ZITHROMAX Z-PAK) 250 MG tablet Take 2 tablets day 1 and daily for the next 4 days (Patient not taking: Reported on 06/16/2020)  . predniSONE (DELTASONE) 20 MG tablet Take 2 tablets (40 mg total) by mouth daily. For 5 days, then 1 tab daily for 5 days   No facility-administered encounter medications on file as of 06/16/2020.    Allergies as of 06/16/2020 - Review Complete 06/16/2020  Allergen Reaction Noted  . Duloxetine  03/24/2020    No past medical history on file.  Past Surgical History:  Procedure Laterality Date  . WISDOM TOOTH EXTRACTION      Family History  Problem Relation Age of Onset  . Stroke Mother   . Dementia  Mother   . Stroke Maternal Grandmother   . Breast cancer Sister   . Thyroid cancer Sister   . Breast cancer Paternal Grandmother     Social History   Socioeconomic History  . Marital status: Married    Spouse name: Not on file  . Number of children: 2  . Years of education: Not on file  . Highest education level: Not on file  Occupational History  . Occupation: Public librarian    Comment: [employer]  Tobacco Use  . Smoking status: Current Every Day Smoker    Packs/day: 1.00    Types: Cigarettes    Start date: 05/17/1981  . Smokeless tobacco: Never Used  Vaping Use  . Vaping Use: Never used  Substance and Sexual Activity  . Alcohol use: Yes    Alcohol/week: 42.0 standard drinks    Types: 42 Cans of beer per week    Comment: 6 pack per day or comparable liquor  . Drug use: No  . Sexual activity: Yes    Partners: Female    Birth control/protection: None  Other Topics Concern  . Not on file  Social History Narrative   Married   2 children--- 1 daughter/1 son   6 step children   Social Determinants of Health   Financial Resource Strain: Not on file  Food Insecurity: Not on file  Transportation Needs: Not on file  Physical Activity: Not on file  Stress: Not on  file  Social Connections: Not on file  Intimate Partner Violence: Not on file    Review of Systems  Respiratory: Positive for cough and shortness of breath.     Vitals:   06/16/20 1647  BP: 126/66  Pulse: 82  SpO2: 98%     Physical Exam Constitutional:      Appearance: Normal appearance.  HENT:     Head: Normocephalic and atraumatic.  Cardiovascular:     Rate and Rhythm: Normal rate and regular rhythm.     Heart sounds: No murmur heard. No friction rub.  Pulmonary:     Effort: No respiratory distress.     Breath sounds: No stridor. No wheezing or rhonchi.  Musculoskeletal:     Cervical back: No rigidity or tenderness.  Neurological:     Mental Status: He is alert.   Psychiatric:        Mood and Affect: Mood normal.    PFT  Moderate obstructive lung disease  Echocardiogram was within normal limits on 03/03/2020  data Reviewed: Chest x-ray 02/23/2020 reviewed showing no acute infiltrate, hyperinflated lung fields  Assessment:  Moderate obstructive lung disease  Shortness of breath on exertion  Active smoker, cutting down on smoking Smoking cessation counseling  Plan/Recommendations: Switch to Trelegy  Sputum for Gram stain and cultures and fungal cultures  Sputum for Gram stain and cultures, fungal studies  Encouraged to call with significant concerns  Smoking cessation counseling  Follow-up in 6 months   Sherrilyn Rist MD [facility] 06/16/2020, 4:56 PM  CC: [name], MD

## 2020-06-17 LAB — MULTIPLE MYELOMA PANEL, SERUM
Albumin SerPl Elph-Mcnc: 4 g/dL (ref 2.9–4.4)
Albumin/Glob SerPl: 1.2 (ref 0.7–1.7)
Alpha 1: 0.2 g/dL (ref 0.0–0.4)
Alpha2 Glob SerPl Elph-Mcnc: 0.7 g/dL (ref 0.4–1.0)
B-Globulin SerPl Elph-Mcnc: 1.1 g/dL (ref 0.7–1.3)
Gamma Glob SerPl Elph-Mcnc: 1.6 g/dL (ref 0.4–1.8)
Globulin, Total: 3.5 g/dL (ref 2.2–3.9)
IgA: 299 mg/dL (ref 90–386)
IgG (Immunoglobin G), Serum: 828 mg/dL (ref 603–1613)
IgM (Immunoglobulin M), Srm: 1014 mg/dL — ABNORMAL HIGH (ref 20–172)
M Protein SerPl Elph-Mcnc: 0.5 g/dL — ABNORMAL HIGH
Total Protein ELP: 7.5 g/dL (ref 6.0–8.5)

## 2020-06-17 LAB — KAPPA/LAMBDA LIGHT CHAINS
Kappa free light chain: 31.5 mg/L — ABNORMAL HIGH (ref 3.3–19.4)
Kappa, lambda light chain ratio: 1.37 (ref 0.26–1.65)
Lambda free light chains: 23 mg/L (ref 5.7–26.3)

## 2020-06-18 LAB — COPPER, SERUM: Copper: 97 ug/dL (ref 69–132)

## 2020-06-23 ENCOUNTER — Ambulatory Visit (INDEPENDENT_AMBULATORY_CARE_PROVIDER_SITE_OTHER): Payer: BC Managed Care – PPO | Admitting: Acute Care

## 2020-06-23 ENCOUNTER — Encounter: Payer: Self-pay | Admitting: Acute Care

## 2020-06-23 ENCOUNTER — Other Ambulatory Visit: Payer: Self-pay

## 2020-06-23 ENCOUNTER — Ambulatory Visit
Admission: RE | Admit: 2020-06-23 | Discharge: 2020-06-23 | Disposition: A | Discharge status: Home / Self Care | Payer: BC Managed Care – PPO | Source: Ambulatory Visit | Attending: Acute Care | Admitting: Acute Care

## 2020-06-23 DIAGNOSIS — Z122 Encounter for screening for malignant neoplasm of respiratory organs: Secondary | ICD-10-CM

## 2020-06-23 DIAGNOSIS — F17210 Nicotine dependence, cigarettes, uncomplicated: Secondary | ICD-10-CM

## 2020-06-23 DIAGNOSIS — J9811 Atelectasis: Secondary | ICD-10-CM | POA: Diagnosis not present

## 2020-06-23 DIAGNOSIS — J432 Centrilobular emphysema: Secondary | ICD-10-CM | POA: Diagnosis not present

## 2020-06-23 DIAGNOSIS — Z87891 Personal history of nicotine dependence: Secondary | ICD-10-CM | POA: Diagnosis not present

## 2020-06-23 NOTE — Patient Instructions (Signed)
Thank you for participating in the [facility] Scre[facility] Program. It was our pleasure to meet you today. We will call you with the results of your scan within the next few days. Your scan will be assigned a Lung RADS category score by the physicians reading the scans.  This Lung RADS score determines follow up scanning.  See below for description of categories, and follow up screening recommendations. We will be in touch to schedule your follow up screening annually or based on recommendations of our providers. We will fax a copy of your scan results to your Primary Care Physician, or the physician who referred you to the program, to ensure they have the results. Please call the office if you have any questions or concerns regarding your scanning experience or results.  Our office number is [phone]. Please speak with Denise Phelps, RN. She is our Lung Cancer Screening RN. If she is unavailable when you call, please have the office staff send her a message. She will return your call at her earliest convenience. Remember, if your scan is normal, we will scan you annually as long as you continue to meet the criteria for the program. (Age 55-77, Current smoker or smoker who has quit within the last 15 years). If you are a smoker, remember, quitting is the single most powerful action that you can take to decrease your risk of lung cancer and other pulmonary, breathing related problems. We know quitting is hard, and we are here to help.  Please let us know if there is anything we can do to help you meet your goal of quitting. If you are a former smoker, congratulations. We are proud of you! Remain smoke free! Remember you can refer friends or family members through the number above.  We will screen them to make sure they meet criteria for the program. Thank you for helping us take better care of you by participating in Lung Screening.  Lung RADS Categories:  Lung RADS 1: no nodules  or definitely non-concerning nodules.  Recommendation is for a repeat annual scan in 12 months.  Lung RADS 2:  nodules that are non-concerning in appearance and behavior with a very low likelihood of becoming an active cancer. Recommendation is for a repeat annual scan in 12 months.  Lung RADS 3: nodules that are probably non-concerning , includes nodules with a low likelihood of becoming an active cancer.  Recommendation is for a 6-month repeat screening scan. Often noted after an upper respiratory illness. We will be in touch to make sure you have no questions, and to schedule your 6-month scan.  Lung RADS 4 A: nodules with concerning findings, recommendation is most often for a follow up scan in 3 months or additional testing based on our provider's assessment of the scan. We will be in touch to make sure you have no questions and to schedule the recommended 3 month follow up scan.  Lung RADS 4 B:  indicates findings that are concerning. We will be in touch with you to schedule additional diagnostic testing based on our provider's  assessment of the scan.   

## 2020-06-23 NOTE — Progress Notes (Signed)
Virtual Visit via Telephone Note  I connected with Ginger Carne on 06/23/20 at  3:30 PM EST by telephone and verified that I am speaking with the correct person using two identifiers.  Location: Patient: At home Provider: At home   I discussed the limitations, risks, security and privacy concerns of performing an evaluation and management service by telephone and the availability of in person appointments. I also discussed with the patient that there may be a patient responsible charge related to this service. The patient expressed understanding and agreed to proceed.     Shared Decision Making Visit Lung Cancer Screening Program [phone])   Eligibility:  Age 60 y.o.  Pack Years Smoking History Calculation 37 pack year smoking history (# packs/per year x # years smoked)  Recent History of coughing up blood  no  Unexplained weight loss? no ( >Than 15 pounds within the last 6 months )  Prior History Lung / other cancer no (Diagnosis within the last 5 years already requiring surveillance chest CT Scans).  Smoking Status Current Smoker  Former Smokers: Years since quit: NA  Quit Date: NA  Visit Components:  Discussion included one or more decision making aids. yes  Discussion included risk/benefits of screening. yes  Discussion included potential follow up diagnostic testing for abnormal scans. yes  Discussion included meaning and risk of over diagnosis. yes  Discussion included meaning and risk of False Positives. yes  Discussion included meaning of total radiation exposure. yes  Counseling Included:  Importance of adherence to annual lung cancer LDCT screening. yes  Impact of comorbidities on ability to participate in the program. yes  Ability and willingness to under diagnostic treatment. yes  Smoking Cessation Counseling:  Current Smokers:   Discussed importance of smoking cessation. yes  Information about tobacco cessation classes and interventions  provided to patient. yes  Patient provided with "ticket" for LDCT Scan. yes  Symptomatic Patient. no  Counseling NA  Diagnosis Code: Tobacco Use Z72.0  Asymptomatic Patient yes  Counseling (Intermediate counseling: > three minutes counseling) O7564  Former Smokers:   Discussed the importance of maintaining cigarette abstinence. yes  Diagnosis Code: Personal History of Nicotine Dependence. P32.951  Information about tobacco cessation classes and interventions provided to patient. Yes  Patient provided with "ticket" for LDCT Scan. yes  Written Order for Lung Cancer Screening with LDCT placed in Epic. Yes (CT Chest Lung Cancer Screening Low Dose W/O CM) [id number] Z12.2-Screening of respiratory organs Z87.891-Personal history of nicotine dependence  I have spent 25 minutes of face to face time with Mr. Degrace discussing the risks and benefits of lung cancer screening. We viewed a power point together that explained in detail the above noted topics. We paused at intervals to allow for questions to be asked and answered to ensure understanding.We discussed that the single most powerful action that he can take to decrease his risk of developing lung cancer is to quit smoking. We discussed whether or not he is ready to commit to setting a quit date. We discussed options for tools to aid in quitting smoking including nicotine replacement therapy, non-nicotine medications, support groups, Quit Smart classes, and behavior modification. We discussed that often times setting smaller, more achievable goals, such as eliminating 1 cigarette a day for a week and then 2 cigarettes a day for a week can be helpful in slowly decreasing the number of cigarettes smoked. This allows for a sense of accomplishment as well as providing a clinical benefit. I gave him the "  Be Stronger Than Your Excuses" card with contact information for community resources, classes, free nicotine replacement therapy, and access to  mobile apps, text messaging, and on-line smoking cessation help. I have also given him my card and contact information in the event he needs to contact me. We discussed the time and location of the scan, and that either Doroteo Glassman RN or I will call with the results within 24-48 hours of receiving them. I have offered him  a copy of the power point we viewed  as a resource in the event they need reinforcement of the concepts we discussed today in the office. The patient verbalized understanding of all of  the above and had no further questions upon leaving the office. They have my contact information in the event they have any further questions.  I spent 3 minutes counseling on smoking cessation and the health risks of continued tobacco abuse.  I explained to the patient that there has been a high incidence of coronary artery disease noted on these exams. I explained that this is a non-gated exam therefore degree or severity cannot be determined. This patient is not on statin therapy. I have asked the patient to follow-up with their PCP regarding any incidental finding of coronary artery disease and management with diet or medication as their PCP  feels is clinically indicated. The patient verbalized understanding of the above and had no further questions upon completion of the visit.     Magdalen Spatz, NP 06/23/2020

## 2020-06-25 NOTE — Progress Notes (Signed)
HEMATOLOGY/ONCOLOGY CONSULTATION NOTE  Date of Service: 06/25/2020  Patient Care Team: [name], MD as PCP - General (Internal Medicine)  CHIEF COMPLAINTS/PURPOSE OF CONSULTATION:  Monoclonal Gammopathy  HISTORY OF PRESENTING ILLNESS:   George West is a wonderful 60 y.o. male who has been referred to [place] by 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 The pt reports that he is doing well overall. We are joined today by his wife.  The pt reports that he originally presented in October 2021 with SOB that had worsened recently. [name] had concerns of neuropathy, which led to all the labs done and signs of abnormal proteins. The protein is on Thymine, but denies Vitamin B12. 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 This numbness is accompanied by burning sensation, purple in color,intermittent swelling, but the pt denies cramping. The pt denies tingling/numbness in hands, pinched nerves in the back. He does note that he heard a popped in his back around two months ago, which immobilized him for a week. This did not affect his neuropathy. The pt notes he also has recently been experiencing lightheadedness/dizziness, which makes him have to stand still. He denies feelings of spinning around and notes this most often occurs after standing up.   The pt notes that he experienced a vasovagal syncope in October 2021, which lasted around 2-3 minutes and led to an ED visit. The pt's SOB occurs after walking short distances. The Pulmonologist got lung testing and an ECHO performed in November, which showed tight airways and decreased performance. 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 The ECHO showed normal function of heart. The pt notes he is on an inhaler (Combivent) but notes minimal help. The pt reports that he smokes 1/2 pack a day currently, down from 1 pack daily.  The pt notes  that his primary driver for having to stop walking or doing activity is his SOB. The neuropathy is primary cause once sitting down for extended periods of time. The pt denies any problems with falling or imbalance. He denies using a cane/walking stick.  The pt notes that he has had an enlarged lymph node in the neck area one year ago and has remain unchanged since then. He also notes a large bump in his upper back right. The pt notes his brother also has this.  The pt notes that he drinks a case of beer a week currently. Over five years ago, the pt notes that he drank two cases a week and a fifth of hard liquor a week in addition.Th pt notes that he has not drank water since he was a teenager, as he only drinks tea, coffee, beer, and liquor.  02/20/2020 IFE showed an IGM Kappa Monoclonal Protein detected.  02/20/2020 Serum protein electrophoresis showed two abnormal protein bands of 0.5 and 0.4.   On review of systems, pt reports SOB, neuropathy of lower extremities, lightheadedness/dizziness and denies muscle weakness, n/v/d, abdominal pain and any other symptoms.  INTERVAL HISTORY  I connected with George West  on 06/26/2020 by telephone and verified that I am speaking with the correct person using two identifiers.   I discussed the limitations of evaluation and management by telemedicine. The patient expressed understanding and agreed to proceed.   Other persons participating in the visit and their role in the encounter:                                                         -  Reinaldo Raddle, Medical Scribe     Patient's location: Home Provider's location: [facility] [place] is a wonderful 60 y.o. 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 The patient's last visit with [place] was on 06/16/2020. The pt reports that he is doing well overall.  The pt reports no new symptoms or concerns.  Lab results 06/16/2020 of CBC w/diff and CMP is as  follows: all values are WNL. 06/16/2020 LDH of 144. 06/16/2020 MMP all WNL except IgM of 1,014, m-protein of 0.5, and IgM kappa monoclonal gammopathy. 06/16/2020 Retic Ct Pct of 0.9, Retic Count Absolute of 43.5K, Immature Retic Fract of 7.4. 06/16/2020 Copper of 97. 06/16/2020 Sedimentation rate of 18 06/16/2020 Kappa free light chain of 31.5, Lambda free chain of 23.0, Kappa Lambda ratio of 1.37.   On review of systems, pt denies any acute symptoms.  MEDICAL HISTORY:  No past medical history on file.  SURGICAL HISTORY: Past Surgical History:  Procedure Laterality Date  . WISDOM TOOTH EXTRACTION      SOCIAL HISTORY: Social History   Socioeconomic History  . Marital status: Married    Spouse name: Not on file  . Number of children: 2  . Years of education: Not on file  . Highest education level: Not on file  Occupational History  . Occupation: Public librarian    Comment: [employer]  Tobacco Use  . Smoking status: Current Every Day Smoker    Packs/day: 1.00    Types: Cigarettes    Start date: 05/17/1981  . Smokeless tobacco: Never Used  Vaping Use  . Vaping Use: Never used  Substance and Sexual Activity  . Alcohol use: Yes    Alcohol/week: 42.0 standard drinks    Types: 42 Cans of beer per week    Comment: 6 pack per day or comparable liquor  . Drug use: No  . Sexual activity: Yes    Partners: Female    Birth control/protection: None  Other Topics Concern  . Not on file  Social History Narrative   Married   2 children--- 1 daughter/1 son   6 step children   Social Determinants of Radio broadcast assistant Strain: Not on file  Food Insecurity: Not on file  Transportation Needs: Not on file  Physical Activity: Not on file  Stress: Not on file  Social Connections: Not on file  Intimate Partner Violence: Not on file    FAMILY HISTORY: Family History  Problem Relation Age of Onset  . Stroke Mother   . Dementia Mother   . Stroke Maternal  Grandmother   . Breast cancer Sister   . Thyroid cancer Sister   . Breast cancer Paternal Grandmother     ALLERGIES:  is allergic to duloxetine.  MEDICATIONS:  Current Outpatient Medications  Medication Sig Dispense Refill  . albuterol (VENTOLIN HFA) 108 (90 Base) MCG/ACT inhaler Inhale 2 puffs into the lungs every 4 (four) hours as needed for wheezing or shortness of breath. 1 each 6  . azithromycin (ZITHROMAX Z-PAK) 250 MG tablet Take 2 tablets day 1 and daily for the next 4 days (Patient not taking: Reported on 06/16/2020) 6 tablet 0  . buPROPion (WELLBUTRIN) 100 MG tablet Take 1 tablet (100 mg total) by mouth 2 (two) times daily. 60 tablet 5  . Fluticasone-Umeclidin-Vilant (TRELEGY ELLIPTA) 100-62.5-25 MCG/INH AEPB Inhale 1 puff into the lungs daily. 28 each 0  . Fluticasone-Umeclidin-Vilant (TRELEGY ELLIPTA) 100-62.5-25 MCG/INH AEPB Inhale 1  puff into the lungs daily. 60 each 6  . predniSONE (DELTASONE) 20 MG tablet Take 2 tablets (40 mg total) by mouth daily. For 5 days, then 1 tab daily for 5 days 15 tablet 0  . thiamine 100 MG tablet Take 100 mg by mouth daily.     No current facility-administered medications for this visit.    REVIEW OF SYSTEMS:   10 Point review of Systems was done is negative except as noted above  PHYSICAL EXAMINATION: ECOG PERFORMANCE STATUS: 1 - Symptomatic but completely ambulatory  Telehealth Visit.  LABORATORY DATA:  I have reviewed the data as listed  . CBC Latest Ref Rng & Units 06/16/2020 02/23/2020 02/20/2020  WBC 4.0 - 10.5 K/uL 6.6 11.5(H) 7.1  Hemoglobin 13.0 - 17.0 g/dL 16.2 16.4 17.9(H)  Hematocrit 39.0 - 52.0 % 48.5 49.1 52.3(H)  Platelets 150 - 400 K/uL 201 207 221.0    . CMP Latest Ref Rng & Units 06/16/2020 02/23/2020 02/20/2020  Glucose 70 - 99 mg/dL 92 92 -  BUN 6 - 20 mg/dL 11 12 -  Creatinine 0.61 - 1.24 mg/dL 0.88 1.00 -  Sodium 135 - 145 mmol/L 139 134(L) -  Potassium 3.5 - 5.1 mmol/L 4.6 3.9 -  Chloride 98 - 111 mmol/L 104  98 -  CO2 22 - 32 mmol/L 28 22 -  Calcium 8.9 - 10.3 mg/dL 9.6 9.3 -  Total Protein 6.5 - 8.1 g/dL 8.1 7.7 8.5(H)  Total Bilirubin 0.3 - 1.2 mg/dL 1.0 0.6 -  Alkaline Phos 38 - 126 U/L 98 79 -  AST 15 - 41 U/L 17 28 -  ALT 0 - 44 U/L 21 33 -    RADIOGRAPHIC STUDIES: I have personally reviewed the radiological images as listed and agreed with the findings in the report. CT CHEST LUNG CA SCREEN LOW DOSE W/O CM  Result Date: 06/24/2020 CLINICAL DATA:  60 year old male current smoker, with 37 pack-year history of smoking, for initial lung cancer screening EXAM: CT CHEST WITHOUT CONTRAST LOW-DOSE FOR LUNG CANCER SCREENING TECHNIQUE: Multidetector CT imaging of the chest was performed following the standard protocol without IV contrast. COMPARISON:  None. FINDINGS: Cardiovascular: Heart is normal in size.  No pericardial effusion. No evidence of thoracic aortic aneurysm. Mediastinum/Nodes: Small mediastinal lymph nodes which do not meet pathologic CT size criteria. Mildly prominent right axillary nodes, measuring up to 11 mm short axis in the right axilla (series 2/image 20) and right subpectoral regions (series 2/image 12). This appearance may be reactive in the setting of recent vaccination. Visualized thyroid is unremarkable. Lungs/Pleura: Mild centrilobular emphysematous changes, upper lung predominant. No focal consolidation.  Mild bibasilar scarring/atelectasis. 4.7 mm triangular subpleural nodule in the anterior right middle lobe. No pleural effusion or pneumothorax. Upper Abdomen: Visualized upper abdomen is grossly unremarkable. Musculoskeletal: Visualized osseous structures are within normal limits. IMPRESSION: Lung-RADS 2, benign appearance or behavior. Continue annual screening with low-dose chest CT without contrast in 12 months. Mildly prominent right axillary and subpectoral nodes, possibly reactive in the setting of recent vaccination. Emphysema (ICD10-J43.9). Electronically Signed   By:  Julian Hy M.D.   On: 06/24/2020 12:25    ASSESSMENT & PLAN:   60 yo with   1) IgM Kappa monoclonal paraproteinemia  ? Waldenstroms macroglobulinemia  PLAN: -Discussed pt's labs, 06/16/2020; blood counts normal w no anemia or cytopenia, chemistries WNL, normal ratio of light chains, LDH normal, Copper normal. -Advised pt increased IgM levels, two m-spikes of 0.5 and 0.3 for a total  of 0.8. Unchanged over the last 4 months. -Advised pt that IgM proteins are unrelated to Myelomas. Discussed Lymphomas and Lymphoplasmacytic Lymphoma, a slow-growing tumor of WBC. -Discussed extent of evaluation-- Pt has no changes in blood counts, kidney function normal, Calcium levels normal. -Advised pt that IgM can be associated with neuropathy. Pt has Vitamin B12 deficiency and extensive alcohol intake that could also describe neuropathy. -The pt is unaware and is not scheduled for nerve conduction study at this time. Will refer pt to Neurologist for nerve conduction study. -Discussed future options-- monitoring and repeat labs in 6 months. Advised pt we desire PET CT Whole Body to observe for enlarged spleen or liver. -Advised pt that if labs and scan confirm Waldenstroms macroglobulinemia, we would just continue to monitor as doing at this time unless symptoms arise. -Recommend pt drink 48-64 oz water daily. Hydration criteria of non-alcoholic, non-carbonated. Recommend pt try to use flavoring drops in water. -Will recommend repeat Neurology referral today. The pt is aware to f/u w PCP for this referral. -Will see back in 6 months with labs. Will get PET CT prior.    FOLLOW UP PET/CT in in 23 weeks Labs in 23 weeks RTC with Dr Irene Limbo in 24 weeks  All of the patients questions were answered with apparent satisfaction. The patient knows to call the clinic with any problems, questions or concerns.  The total time spent in the appointment was 20 minutes and more than 50% was on counseling and direct  patient cares.    Sullivan Lone MD [place] AAHIVMS [facility] Hematology/Oncology Physician [facility]  (Office):       [phone] (Work cell):  [phone] (Fax):           [phone]  06/25/2020 9:08 AM  I, Reinaldo Raddle, am acting as scribe for Dr. Sullivan Lone, MD.   .I have reviewed the above documentation for accuracy and completeness, and I agree with the above. Brunetta Genera MD

## 2020-06-26 ENCOUNTER — Inpatient Hospital Stay: Payer: BC Managed Care – PPO | Attending: Hematology | Admitting: Hematology

## 2020-06-26 DIAGNOSIS — D472 Monoclonal gammopathy: Secondary | ICD-10-CM | POA: Diagnosis not present

## 2020-06-26 NOTE — Progress Notes (Signed)
Please call patient and let them  know their  low dose Ct was read as a Lung RADS 2: nodules that are benign in appearance and behavior with a very low likelihood of becoming a clinically active cancer due to size or lack of growth. Recommendation per radiology is for a repeat LDCT in 12 months. .Please let them  know we will order and schedule their  annual screening scan for 06/2021. Please let them  know there was notation of CAD on their  scan.  Please remind the patient  that this is a non-gated exam therefore degree or severity of disease  cannot be determined. Please have them  follow up with their PCP regarding potential risk factor modification, dietary therapy or pharmacologic therapy if clinically indicated. Pt.  is not  currently on statin therapy. Please place order for annual  screening scan for  06/2021 and fax results to PCP. Thanks so much.

## 2020-06-27 ENCOUNTER — Other Ambulatory Visit: Payer: Self-pay | Admitting: *Deleted

## 2020-06-27 DIAGNOSIS — F17210 Nicotine dependence, cigarettes, uncomplicated: Secondary | ICD-10-CM

## 2020-08-04 ENCOUNTER — Encounter (INDEPENDENT_AMBULATORY_CARE_PROVIDER_SITE_OTHER): Payer: Self-pay

## 2020-09-24 ENCOUNTER — Ambulatory Visit: Payer: BC Managed Care – PPO | Admitting: Internal Medicine

## 2020-10-01 ENCOUNTER — Ambulatory Visit (INDEPENDENT_AMBULATORY_CARE_PROVIDER_SITE_OTHER): Payer: BC Managed Care – PPO | Admitting: Internal Medicine

## 2020-10-01 ENCOUNTER — Other Ambulatory Visit: Payer: Self-pay

## 2020-10-01 ENCOUNTER — Encounter: Payer: Self-pay | Admitting: Internal Medicine

## 2020-10-01 VITALS — BP 112/78 | HR 85 | Temp 98.6°F | Ht 70.0 in | Wt 177.0 lb

## 2020-10-01 DIAGNOSIS — M7989 Other specified soft tissue disorders: Secondary | ICD-10-CM | POA: Diagnosis not present

## 2020-10-01 NOTE — Assessment & Plan Note (Signed)
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

## 2020-10-01 NOTE — Progress Notes (Signed)
Subjective:    Patient ID: George West, male    DOB: November 10, 1960, 60 y.o.   MRN: [id number]  HPI Here with wife due to a knot on his right shoulder This visit occurred during the SARS-CoV-2 public health emergency.  Safety protocols were in place, including screening questions prior to the visit, additional usage of staff PPE, and extensive cleaning of exam room while observing appropriate contact time as indicated for disinfecting solutions.   Has a knot on his right shoulder "Interfering with the mobility of my shoulder" There for years-but has increased in size--now harder (used to be squishy--now har)  No treatment Current Outpatient Medications on File Prior to Visit  Medication Sig Dispense Refill  . albuterol (VENTOLIN HFA) 108 (90 Base) MCG/ACT inhaler Inhale 2 puffs into the lungs every 4 (four) hours as needed for wheezing or shortness of breath. 1 each 6  . buPROPion (WELLBUTRIN) 100 MG tablet Take 1 tablet (100 mg total) by mouth 2 (two) times daily. 60 tablet 5  . Fluticasone-Umeclidin-Vilant (TRELEGY ELLIPTA) 100-62.5-25 MCG/INH AEPB Inhale 1 puff into the lungs daily. 60 each 6  . thiamine 100 MG tablet Take 100 mg by mouth daily.     No current facility-administered medications on file prior to visit.    Allergies  Allergen Reactions  . Duloxetine     dizziness    History reviewed. No pertinent past medical history.  Past Surgical History:  Procedure Laterality Date  . WISDOM TOOTH EXTRACTION      Family History  Problem Relation Age of Onset  . Stroke Mother   . Dementia Mother   . Stroke Maternal Grandmother   . Breast cancer Sister   . Thyroid cancer Sister   . Breast cancer Paternal Grandmother     Social History   Socioeconomic History  . Marital status: Married    Spouse name: Not on file  . Number of children: 2  . Years of education: Not on file  . Highest education level: Not on file  Occupational History  . Occupation: Biomedical engineer    Comment: [employer]  Tobacco Use  . Smoking status: Current Every Day Smoker    Packs/day: 1.00    Types: Cigarettes    Start date: 05/17/1981  . Smokeless tobacco: Never Used  Vaping Use  . Vaping Use: Never used  Substance and Sexual Activity  . Alcohol use: Yes    Alcohol/week: 42.0 standard drinks    Types: 42 Cans of beer per week    Comment: 6 pack per day or comparable liquor  . Drug use: No  . Sexual activity: Yes    Partners: Female    Birth control/protection: None  Other Topics Concern  . Not on file  Social History Narrative   Married   2 children--- 1 daughter/1 son   6 step children   Social Determinants of Radio broadcast assistant Strain: Not on file  Food Insecurity: Not on file  Transportation Needs: Not on file  Physical Activity: Not on file  Stress: Not on file  Social Connections: Not on file  Intimate Partner Violence: Not on file   Review of Systems No fever Not sick    Objective:   Physical Exam Constitutional:      Appearance: Normal appearance.  Musculoskeletal:     Comments: Firm, well circumcised mass (~3cm total diameter and depth) overlying the muscle below the posterior right shoulder ROM in shoulder is normal  Neurological:  Mental Status: He is alert.            Assessment & Plan:

## 2020-11-07 ENCOUNTER — Other Ambulatory Visit (HOSPITAL_COMMUNITY): Payer: Self-pay | Admitting: Surgery

## 2020-11-07 DIAGNOSIS — R222 Localized swelling, mass and lump, trunk: Secondary | ICD-10-CM | POA: Diagnosis not present

## 2020-11-10 ENCOUNTER — Encounter (HOSPITAL_COMMUNITY): Payer: Self-pay

## 2020-11-10 NOTE — Progress Notes (Signed)
       Patient Demographics  Patient Name  George West, George West Sex  Male DOB  10-24-1960 SSN  [id number] Address  [address] [place] [address] Phone  [phone] (Home) *Preferred*  [phone] (Mobile)     RE: BIOPSY Received: 2 days ago Corrie Mckusick, DO  [facility] for CT guided right shoulder soft tissue mass biopsy.  Would have [place] available and in room.   Image 19 of #2 from CT 06/23/20.  Would use CT to start given current is 67 months old.   Earleen Newport         Previous Messages    ----- Message -----  From: Lenore Cordia  Sent: 11/07/2020   4:42 PM EDT  To: [facility] Procedure Requests  Subject: BIOPSY                                         Procedure Requested: US GUIDANCE BIOPSY    Reason for Procedure: RIGHT UPPER BACK/AXILLARY MASS, CONCERN FOR MALIGNANCY   Provider Requesting: Felicie Morn  Provider Telephone:  [phone]    Other Info:

## 2020-11-14 ENCOUNTER — Other Ambulatory Visit: Payer: Self-pay | Admitting: Radiology

## 2020-11-18 ENCOUNTER — Other Ambulatory Visit: Payer: Self-pay | Admitting: Student

## 2020-11-18 NOTE — H&P (Signed)
Chief Complaint: Patient was seen in consultation today for subcutaneous mass of back  Referring Physician(s): Stechschulte,Paul J  Supervising Physician: Mir, Sharen Heck  Patient Status: [facility] - Out-pt  History of Present Illness: George West is a 60 y.o. male with history of a right shoulder soft tissue mass with recent change from "soft" to "nodular" with tenderness at the site as well as down his arm.   CT Chest 06/23/20: Lung-RADS 2, benign appearance or behavior. Continue annual screening with low-dose chest CT without contrast in 12 months.   Mildly prominent right axillary and subpectoral nodes, possibly reactive in the setting of recent vaccination.   Emphysema (ICD10-J43.9).  Nodes vs. Mass persist and patient is referred to IR for biopsy at the request of Dr. Thermon Leyland.  Case reviewed and approved by Dr. Earleen Newport.   George West presents to [facility] Radiology today in his usual state of health.  He has been NPO.  He has no concerns or new symptoms today.  His wife is planning to provide transport.   History reviewed. No pertinent past medical history.  Past Surgical History:  Procedure Laterality Date   WISDOM TOOTH EXTRACTION      Allergies: Duloxetine  Medications: Prior to Admission medications   Medication Sig Start Date End Date Taking? Authorizing Provider  albuterol (VENTOLIN HFA) 108 (90 Base) MCG/ACT inhaler Inhale 2 puffs into the lungs every 4 (four) hours as needed for wheezing or shortness of breath. 06/16/20  Yes Olalere, Adewale A, MD  buPROPion (WELLBUTRIN) 100 MG tablet Take 1 tablet (100 mg total) by mouth 2 (two) times daily. Patient taking differently: Take 100 mg by mouth daily. 03/24/20  Yes [name], MD  Fluticasone-Umeclidin-Vilant (TRELEGY ELLIPTA) 100-62.5-25 MCG/INH AEPB Inhale 1 puff into the lungs daily. 06/16/20  Yes Olalere, Adewale A, MD  naproxen sodium (ALEVE) 220 MG tablet Take 220 mg by mouth at bedtime as needed (pain).    Yes [provider]     Family History  Problem Relation Age of Onset   Stroke Mother    Dementia Mother    Stroke Maternal Grandmother    Breast cancer Sister    Thyroid cancer Sister    Breast cancer Paternal Grandmother     Social History   Socioeconomic History   Marital status: Married    Spouse name: Not on file   Number of children: 2   Years of education: Not on file   Highest education level: Not on file  Occupational History   Occupation: Public librarian    Comment: [employer]  Tobacco Use   Smoking status: Every Day    Packs/day: 1.00    Pack years: 0.00    Types: Cigarettes    Start date: 05/17/1981   Smokeless tobacco: Never  Vaping Use   Vaping Use: Never used  Substance and Sexual Activity   Alcohol use: Yes    Alcohol/week: 42.0 standard drinks    Types: 42 Cans of beer per week    Comment: 6 pack per day or comparable liquor   Drug use: No   Sexual activity: Yes    Partners: Female    Birth control/protection: None  Other Topics Concern   Not on file  Social History Narrative   Married   2 children--- 1 daughter/1 son   6 step children   Social Determinants of Health   Financial Resource Strain: Not on file  Food Insecurity: Not on file  Transportation Needs: Not on file  Physical Activity: Not on file  Stress: Not on file  Social Connections: Not on file     Review of Systems: A 12 point ROS discussed and pertinent positives are indicated in the HPI above.  All other systems are negative.  Review of Systems  Constitutional:  Negative for fatigue and fever.  Respiratory:  Negative for cough and shortness of breath.   Cardiovascular:  Negative for chest pain.  Gastrointestinal:  Negative for abdominal pain, nausea and vomiting.  Musculoskeletal:  Positive for joint swelling. Negative for back pain.  Psychiatric/Behavioral:  Negative for behavioral problems and confusion.    Vital Signs: BP 137/87   Pulse 76    Temp 98.4 F (36.9 C) (Oral)   Resp 16   Ht 5\' 11"  (1.803 m)   Wt 180 lb (81.6 kg)   SpO2 97%   BMI 25.10 kg/m   Physical Exam Vitals and nursing note reviewed.  Constitutional:      General: He is not in acute distress.    Appearance: Normal appearance. He is not ill-appearing.  HENT:     Mouth/Throat:     Mouth: Mucous membranes are moist.     Pharynx: Oropharynx is clear.  Cardiovascular:     Rate and Rhythm: Normal rate and regular rhythm.  Pulmonary:     Effort: Pulmonary effort is normal. No respiratory distress.     Breath sounds: Normal breath sounds.  Musculoskeletal:        General: Tenderness (R axillary/back area with nodular, hardened mass.  Tender to palpation) present. Normal range of motion.  Skin:    General: Skin is warm and dry.  Neurological:     General: No focal deficit present.     Mental Status: He is alert and oriented to person, place, and time. Mental status is at baseline.  Psychiatric:        Mood and Affect: Mood normal.        Behavior: Behavior normal.        Thought Content: Thought content normal.        Judgment: Judgment normal.     MD Evaluation Airway: WNL Heart: WNL Abdomen: WNL Chest/ Lungs: WNL ASA  Classification: 3 Mallampati/Airway Score: Two   Imaging: No results found.  Labs:  CBC: Recent Labs    02/20/20 1241 02/23/20 2153 06/16/20 1414 11/19/20 0610  WBC 7.1 11.5* 6.6 6.9  HGB 17.9* 16.4 16.2 16.2  HCT 52.3* 49.1 48.5 47.5  PLT 221.0 207 201 236    COAGS: Recent Labs    11/19/20 0610  INR 1.0    BMP: Recent Labs    02/20/20 1241 02/23/20 2153 06/16/20 1414  NA 135 134* 139  K 5.0 3.9 4.6  CL 99 98 104  CO2 30 22 28   GLUCOSE 98 92 92  BUN 10 12 11   CALCIUM 10.4 9.3 9.6  CREATININE 0.86 1.00 0.88  GFRNONAA  --  >60 >60    LIVER FUNCTION TESTS: Recent Labs    02/20/20 1241 02/23/20 2153 06/16/20 1414  BILITOT 0.8 0.6 1.0  AST 25 28 17   ALT 35 33 21  ALKPHOS 93 79 98  PROT  8.5*  8.5* 7.7 8.1  ALBUMIN 5.0  5.0 3.9 4.3    TUMOR MARKERS: No results for input(s): AFPTM, CEA, CA199, CHROMGRNA in the last 8760 hours.  Assessment and Plan: Patient with past medical history of monoclonal gammopathy presents with complaint of right shoulder/back mass.  IR consulted for biopsy at  the request of Dr. Thermon Leyland. Case reviewed by Dr. Earleen Newport who approves patient for procedure.  Patient presents today in their usual state of health.  He has been NPO and is not currently on blood thinners.   Risks and benefits was discussed with the patient and/or patient's family including, but not limited to bleeding, infection, damage to adjacent structures or low yield requiring additional tests.  All of the questions were answered and there is agreement to proceed.  Consent signed and in chart.  Thank you for this interesting consult.  I greatly enjoyed meeting George West and look forward to participating in their care.  A copy of this report was sent to the requesting provider on this date.  Electronically Signed: Docia Barrier, PA 11/19/2020, 7:53 AM   I spent a total of  30 Minutes   in face to face in clinical consultation, greater than 50% of which was counseling/coordinating care for subcutaneous right shoulder mass.

## 2020-11-19 ENCOUNTER — Other Ambulatory Visit: Payer: Self-pay

## 2020-11-19 ENCOUNTER — Encounter (HOSPITAL_COMMUNITY): Payer: Self-pay

## 2020-11-19 ENCOUNTER — Ambulatory Visit (HOSPITAL_COMMUNITY)
Admission: RE | Admit: 2020-11-19 | Discharge: 2020-11-19 | Disposition: A | Discharge status: Home / Self Care | Payer: BC Managed Care – PPO | Source: Ambulatory Visit | Attending: Surgery | Admitting: Surgery

## 2020-11-19 DIAGNOSIS — C439 Malignant melanoma of skin, unspecified: Secondary | ICD-10-CM | POA: Diagnosis not present

## 2020-11-19 DIAGNOSIS — J439 Emphysema, unspecified: Secondary | ICD-10-CM | POA: Diagnosis not present

## 2020-11-19 DIAGNOSIS — C7989 Secondary malignant neoplasm of other specified sites: Secondary | ICD-10-CM | POA: Diagnosis not present

## 2020-11-19 DIAGNOSIS — R222 Localized swelling, mass and lump, trunk: Secondary | ICD-10-CM

## 2020-11-19 DIAGNOSIS — D472 Monoclonal gammopathy: Secondary | ICD-10-CM | POA: Diagnosis not present

## 2020-11-19 DIAGNOSIS — F17210 Nicotine dependence, cigarettes, uncomplicated: Secondary | ICD-10-CM | POA: Insufficient documentation

## 2020-11-19 DIAGNOSIS — R238 Other skin changes: Secondary | ICD-10-CM | POA: Diagnosis not present

## 2020-11-19 DIAGNOSIS — M799 Soft tissue disorder, unspecified: Secondary | ICD-10-CM | POA: Diagnosis not present

## 2020-11-19 DIAGNOSIS — C4361 Malignant melanoma of right upper limb, including shoulder: Secondary | ICD-10-CM | POA: Diagnosis not present

## 2020-11-19 DIAGNOSIS — R2231 Localized swelling, mass and lump, right upper limb: Secondary | ICD-10-CM | POA: Diagnosis not present

## 2020-11-19 LAB — CBC WITH DIFFERENTIAL/PLATELET
Abs Immature Granulocytes: 0.04 10*3/uL (ref 0.00–0.07)
Basophils Absolute: 0 10*3/uL (ref 0.0–0.1)
Basophils Relative: 1 %
Eosinophils Absolute: 0.2 10*3/uL (ref 0.0–0.5)
Eosinophils Relative: 3 %
HCT: 47.5 % (ref 39.0–52.0)
Hemoglobin: 16.2 g/dL (ref 13.0–17.0)
Immature Granulocytes: 1 %
Lymphocytes Relative: 23 %
Lymphs Abs: 1.6 10*3/uL (ref 0.7–4.0)
MCH: 33.5 pg (ref 26.0–34.0)
MCHC: 34.1 g/dL (ref 30.0–36.0)
MCV: 98.1 fL (ref 80.0–100.0)
Monocytes Absolute: 0.8 10*3/uL (ref 0.1–1.0)
Monocytes Relative: 11 %
Neutro Abs: 4.4 10*3/uL (ref 1.7–7.7)
Neutrophils Relative %: 61 %
Platelets: 236 10*3/uL (ref 150–400)
RBC: 4.84 MIL/uL (ref 4.22–5.81)
RDW: 12.1 % (ref 11.5–15.5)
WBC: 6.9 10*3/uL (ref 4.0–10.5)
nRBC: 0 % (ref 0.0–0.2)

## 2020-11-19 LAB — PROTIME-INR
INR: 1 (ref 0.8–1.2)
Prothrombin Time: 13.4 seconds (ref 11.4–15.2)

## 2020-11-19 MED ORDER — LIDOCAINE HCL 1 % IJ SOLN
INTRAMUSCULAR | Status: AC
  Filled 2020-11-19: qty 10

## 2020-11-19 MED ORDER — MIDAZOLAM HCL 2 MG/2ML IJ SOLN
INTRAMUSCULAR | Status: AC | PRN
  Administered 2020-11-19: 1 mg via INTRAVENOUS
  Administered 2020-11-19: 0.5 mg via INTRAVENOUS

## 2020-11-19 MED ORDER — MIDAZOLAM HCL 2 MG/2ML IJ SOLN
INTRAMUSCULAR | Status: AC
  Filled 2020-11-19: qty 2

## 2020-11-19 MED ORDER — SODIUM CHLORIDE 0.9 % IV SOLN
INTRAVENOUS | Status: AC | PRN
  Administered 2020-11-19: 10 mL/h via INTRAVENOUS

## 2020-11-19 MED ORDER — FENTANYL CITRATE (PF) 100 MCG/2ML IJ SOLN
INTRAMUSCULAR | Status: AC | PRN
  Administered 2020-11-19: 50 ug via INTRAVENOUS
  Administered 2020-11-19: 25 ug via INTRAVENOUS

## 2020-11-19 MED ORDER — SODIUM CHLORIDE 0.9 % IV SOLN: INTRAVENOUS | Status: DC

## 2020-11-19 MED ORDER — FENTANYL CITRATE (PF) 100 MCG/2ML IJ SOLN
INTRAMUSCULAR | Status: AC
  Filled 2020-11-19: qty 2

## 2020-11-19 NOTE — Procedures (Signed)
Interventional Radiology Procedure Note  Procedure: CT guided biopsy   Indication: Right shoulder mass  Findings: Please refer to procedural dictation for full description.  Complications: None  EBL: < 10 mL  Miachel Roux, MD [phone]

## 2020-11-20 ENCOUNTER — Telehealth: Payer: Self-pay | Admitting: Hematology

## 2020-11-20 NOTE — Telephone Encounter (Signed)
R/s appt per 7/7 sch msg. Pt's wife is aware.

## 2020-11-24 LAB — SURGICAL PATHOLOGY

## 2020-11-26 ENCOUNTER — Other Ambulatory Visit: Payer: Self-pay | Admitting: Hematology

## 2020-11-26 ENCOUNTER — Encounter: Payer: BC Managed Care – PPO | Admitting: Internal Medicine

## 2020-11-26 DIAGNOSIS — C4359 Malignant melanoma of other part of trunk: Secondary | ICD-10-CM

## 2020-11-26 DIAGNOSIS — D472 Monoclonal gammopathy: Secondary | ICD-10-CM

## 2020-12-03 ENCOUNTER — Ambulatory Visit (HOSPITAL_COMMUNITY)
Admission: RE | Admit: 2020-12-03 | Discharge: 2020-12-03 | Disposition: A | Discharge status: Home / Self Care | Payer: BC Managed Care – PPO | Source: Ambulatory Visit | Attending: Hematology | Admitting: Hematology

## 2020-12-03 ENCOUNTER — Ambulatory Visit (HOSPITAL_COMMUNITY): Payer: BC Managed Care – PPO

## 2020-12-03 ENCOUNTER — Other Ambulatory Visit: Payer: Self-pay

## 2020-12-03 ENCOUNTER — Inpatient Hospital Stay: Payer: BC Managed Care – PPO | Attending: Hematology

## 2020-12-03 DIAGNOSIS — R590 Localized enlarged lymph nodes: Secondary | ICD-10-CM | POA: Diagnosis not present

## 2020-12-03 DIAGNOSIS — C4359 Malignant melanoma of other part of trunk: Secondary | ICD-10-CM | POA: Insufficient documentation

## 2020-12-03 DIAGNOSIS — D472 Monoclonal gammopathy: Secondary | ICD-10-CM | POA: Insufficient documentation

## 2020-12-03 LAB — CMP (CANCER CENTER ONLY)
ALT: 24 U/L (ref 0–44)
AST: 19 U/L (ref 15–41)
Albumin: 4 g/dL (ref 3.5–5.0)
Alkaline Phosphatase: 123 U/L (ref 38–126)
Anion gap: 8 (ref 5–15)
BUN: 8 mg/dL (ref 6–20)
CO2: 24 mmol/L (ref 22–32)
Calcium: 9.6 mg/dL (ref 8.9–10.3)
Chloride: 105 mmol/L (ref 98–111)
Creatinine: 0.71 mg/dL (ref 0.61–1.24)
GFR, Estimated: >60 mL/min (ref >60)
Glucose, Bld: 92 mg/dL (ref 70–99)
Potassium: 4.6 mmol/L (ref 3.5–5.1)
Sodium: 137 mmol/L (ref 135–145)
Total Bilirubin: 0.4 mg/dL (ref 0.3–1.2)
Total Protein: 7.9 g/dL (ref 6.5–8.1)

## 2020-12-03 LAB — CBC WITH DIFFERENTIAL/PLATELET
Abs Immature Granulocytes: 0.04 10*3/uL (ref 0.00–0.07)
Basophils Absolute: 0 10*3/uL (ref 0.0–0.1)
Basophils Relative: 1 %
Eosinophils Absolute: 0.2 10*3/uL (ref 0.0–0.5)
Eosinophils Relative: 3 %
HCT: 46.6 % (ref 39.0–52.0)
Hemoglobin: 16.4 g/dL (ref 13.0–17.0)
Immature Granulocytes: 1 %
Lymphocytes Relative: 19 %
Lymphs Abs: 1.6 10*3/uL (ref 0.7–4.0)
MCH: 33.5 pg (ref 26.0–34.0)
MCHC: 35.2 g/dL (ref 30.0–36.0)
MCV: 95.1 fL (ref 80.0–100.0)
Monocytes Absolute: 0.7 10*3/uL (ref 0.1–1.0)
Monocytes Relative: 8 %
Neutro Abs: 5.6 10*3/uL (ref 1.7–7.7)
Neutrophils Relative %: 68 %
Platelets: 248 10*3/uL (ref 150–400)
RBC: 4.9 MIL/uL (ref 4.22–5.81)
RDW: 11.9 % (ref 11.5–15.5)
WBC: 8.1 10*3/uL (ref 4.0–10.5)
nRBC: 0 % (ref 0.0–0.2)

## 2020-12-03 LAB — GLUCOSE, CAPILLARY: Glucose-Capillary: 114 mg/dL — ABNORMAL HIGH (ref 70–99)

## 2020-12-03 MED ORDER — FLUDEOXYGLUCOSE F - 18 (FDG) INJECTION
9.3000 | Freq: Once | INTRAVENOUS | Status: AC | PRN
  Administered 2020-12-03: 8.7 via INTRAVENOUS

## 2020-12-04 ENCOUNTER — Other Ambulatory Visit: Payer: BC Managed Care – PPO

## 2020-12-08 ENCOUNTER — Telehealth: Payer: Self-pay | Admitting: *Deleted

## 2020-12-08 ENCOUNTER — Other Ambulatory Visit: Payer: Self-pay | Admitting: General Surgery

## 2020-12-08 DIAGNOSIS — C4361 Malignant melanoma of right upper limb, including shoulder: Secondary | ICD-10-CM | POA: Diagnosis not present

## 2020-12-08 NOTE — Telephone Encounter (Signed)
Patient's wife [name] called stating that her husband has seen Dr. Irene Limbo oncologist and had a PET scan done. [name] stated that she would like for Dr. Silvio Pate to look at his test results. Patient's wife stated that there is a problem with his aortic branch vessels/hardening of the arteries. Patient's wife stated the last 2 weeks her husband has felt full after eating and nauseated. Patient's wife stated that he does not have much of an appetite and is sleeping a lot.  Patient's wife was advised that Dr. Silvio Pate is out of the office this week but we can get him in to see someone else. Patient's wife stated that he has an appointment with the surgeon today and will discuss this with the surgeon. Patient's wife stated that she is not sure if his symptoms could be related to the cancer or not.  Patient's wife stated that he has an appointment this Thursday with Dr. Irene Limbo and may go ahead and call his office about his symptoms today. Advised patient's wife if her husband needs to be seen by another provider here this week to call the office back and we can get him in with someone. Patient's wife is going to discuss his symptoms with the surgeon and the oncologist and will call the office back if she feels that he needs to be seen.

## 2020-12-09 LAB — MULTIPLE MYELOMA PANEL, SERUM
Albumin SerPl Elph-Mcnc: 4 g/dL (ref 2.9–4.4)
Albumin/Glob SerPl: 1.4 (ref 0.7–1.7)
Alpha 1: 0.2 g/dL (ref 0.0–0.4)
Alpha2 Glob SerPl Elph-Mcnc: 0.7 g/dL (ref 0.4–1.0)
B-Globulin SerPl Elph-Mcnc: 1 g/dL (ref 0.7–1.3)
Gamma Glob SerPl Elph-Mcnc: 1.2 g/dL (ref 0.4–1.8)
Globulin, Total: 3 g/dL (ref 2.2–3.9)
IgA: 285 mg/dL (ref 90–386)
IgG (Immunoglobin G), Serum: 791 mg/dL (ref 603–1613)
IgM (Immunoglobulin M), Srm: 874 mg/dL — ABNORMAL HIGH (ref 20–172)
M Protein SerPl Elph-Mcnc: 0.5 g/dL — ABNORMAL HIGH
Total Protein ELP: 7 g/dL (ref 6.0–8.5)

## 2020-12-09 NOTE — Telephone Encounter (Signed)
Patient with metastatic melanoma. Agree with discussing his symptoms with oncology first.   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   Agree with appointment to discuss further if oncology/surgery not able to provide necessary support/work-up

## 2020-12-09 NOTE — Telephone Encounter (Signed)
To Dr Silvio Pate as Juluis Rainier when he returns.

## 2020-12-09 NOTE — Telephone Encounter (Signed)
Dr. Silvio Pate is out of the office. Please review note.

## 2020-12-10 NOTE — Progress Notes (Signed)
HEMATOLOGY/ONCOLOGY CONSULTATION NOTE  Date of Service: 12/10/2020  Patient Care Team: [name], MD as PCP - General (Internal Medicine)  CHIEF COMPLAINTS/PURPOSE OF CONSULTATION:  Monoclonal Gammopathy Malignant melanoma HISTORY OF PRESENTING ILLNESS:   George West is a wonderful 60 y.o. male who has been referred to [place] by 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 The pt reports that he is doing well overall. We are joined today by his wife.  The pt reports that he originally presented in October 2021 with SOB that had worsened recently. [name] had concerns of neuropathy, which led to all the labs done and signs of abnormal proteins. The protein is on Thymine, but denies Vitamin B12. 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 This numbness is accompanied by burning sensation, purple in color,intermittent swelling, but the pt denies cramping. The pt denies tingling/numbness in hands, pinched nerves in the back. He does note that he heard a popped in his back around two months ago, which immobilized him for a week. This did not affect his neuropathy. The pt notes he also has recently been experiencing lightheadedness/dizziness, which makes him have to stand still. He denies feelings of spinning around and notes this most often occurs after standing up.   The pt notes that he experienced a vasovagal syncope in October 2021, which lasted around 2-3 minutes and led to an ED visit. The pt's SOB occurs after walking short distances. The Pulmonologist got lung testing and an ECHO performed in November, which showed tight airways and decreased performance. 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 The ECHO showed normal function of heart. The pt notes he is on an inhaler (Combivent) but notes minimal help. The pt reports that he smokes 1/2 pack a day currently, down from 1 pack  daily.  The pt notes that his primary driver for having to stop walking or doing activity is his SOB. The neuropathy is primary cause once sitting down for extended periods of time. The pt denies any problems with falling or imbalance. He denies using a cane/walking stick.  The pt notes that he has had an enlarged lymph node in the neck area one year ago and has remain unchanged since then. He also notes a large bump in his upper back right. The pt notes his brother also has this.  The pt notes that he drinks a case of beer a week currently. Over five years ago, the pt notes that he drank two cases a week and a fifth of hard liquor a week in addition.Th pt notes that he has not drank water since he was a teenager, as he only drinks tea, coffee, beer, and liquor.  02/20/2020 IFE showed an IGM Kappa Monoclonal Protein detected.  02/20/2020 Serum protein electrophoresis showed two abnormal protein bands of 0.5 and 0.4.   On review of systems, pt reports SOB, neuropathy of lower extremities, lightheadedness/dizziness and denies muscle weakness, n/v/d, abdominal pain and any other symptoms.  INTERVAL HISTORY  George West is a wonderful 60 y.o. male who is here today for management and evaluation of newly diagnosed malignant melanoma and his IgM kappa monoclonal paraproteinemia. The patient's last visit with [place] was on 06/26/2020. The pt reports that he is doing well overall.  The pt reports he developed a nodule/mass on his left upper back/posterior shoulder. He had a CT-guided biopsy of this on 11/19/2020 which  showed malignant melanoma. He has seen Dr. Barry Dienes and has been scheduled for surgery on 12/15/2020.  Of note since the patient's last visit, pt has had PET Whole Body ([id number]) on 12/03/2020, which revealed "1. Large hypermetabolic soft tissue mass posterior to the right  scapula consistent with known metastatic melanoma. 2. Numerous enlarged right axillary, subpectoral and right  cervical lymph nodes are only mildly hypermetabolic, although appear progressive from previous chest CT and are suspicious for nodal metastases given the right shoulder soft tissue mass. 3. No distant metastases or primary cutaneous lesion identified. 4. No suspicious osseous findings. 5. Incidental findings as above, including hepatic steatosis, nonobstructing left renal calculi and aortic atherosclerosis."  We discussed that it is unclear if these mildly hypermetabolic lymph nodes are related to spread from his malignant melanoma or are related to possible indolent lymphoma associated with his IgM kappa monoclonal paraproteinemia. We discussed that we will hope that the surgeons would biopsy this is a part of his sentinel lymph node biopsy work-up.  We discussed sending additional molecular testing for his malignant melanoma.  The pathology did not suggest that the malignant melanoma arose from the skin in the area of the mass but that it was not a deeper tissue suggesting it was a metastatic lesion.  Surgical pathology after excision of we will also confirm this more definitively.  Lab results 12/03/2020 of CBC w/diff and CMP is as follows: all values are WNL. 12/03/2020 MMP WNL except IgM of 874, m-protein of 0.5.  On review of systems, pt reports no headaches no focal neurological deficits no other acute new symptoms.  No focal bone pains.  MEDICAL HISTORY:  No past medical history on file.  SURGICAL HISTORY: Past Surgical History:  Procedure Laterality Date   WISDOM TOOTH EXTRACTION      SOCIAL HISTORY: Social History   Socioeconomic History   Marital status: Married    Spouse name: Not on file   Number of children: 2   Years of education: Not on file   Highest education level: Not on file  Occupational History   Occupation: Public librarian    Comment: [employer]  Tobacco Use   Smoking status: Every Day    Packs/day: 1.00    Types: Cigarettes     Start date: 05/17/1981   Smokeless tobacco: Never  Vaping Use   Vaping Use: Never used  Substance and Sexual Activity   Alcohol use: Yes    Alcohol/week: 42.0 standard drinks    Types: 42 Cans of beer per week    Comment: 6 pack per day or comparable liquor   Drug use: No   Sexual activity: Yes    Partners: Female    Birth control/protection: None  Other Topics Concern   Not on file  Social History Narrative   Married   2 children--- 1 daughter/1 son   6 step children   Social Determinants of Health   Financial Resource Strain: Not on file  Food Insecurity: Not on file  Transportation Needs: Not on file  Physical Activity: Not on file  Stress: Not on file  Social Connections: Not on file  Intimate Partner Violence: Not on file    FAMILY HISTORY: Family History  Problem Relation Age of Onset   Stroke Mother    Dementia Mother    Stroke Maternal Grandmother    Breast cancer Sister    Thyroid cancer Sister    Breast cancer Paternal Grandmother     ALLERGIES:  is  allergic to duloxetine.  MEDICATIONS:  Current Outpatient Medications  Medication Sig Dispense Refill   albuterol (VENTOLIN HFA) 108 (90 Base) MCG/ACT inhaler Inhale 2 puffs into the lungs every 4 (four) hours as needed for wheezing or shortness of breath. 1 each 6   buPROPion (WELLBUTRIN) 100 MG tablet Take 1 tablet (100 mg total) by mouth 2 (two) times daily. 60 tablet 5   Fluticasone-Umeclidin-Vilant (TRELEGY ELLIPTA) 100-62.5-25 MCG/INH AEPB Inhale 1 puff into the lungs daily. 60 each 6   naproxen sodium (ALEVE) 220 MG tablet Take 220 mg by mouth daily as needed (pain).     No current facility-administered medications for this visit.    REVIEW OF SYSTEMS:   10 Point review of Systems was done is negative except as noted above  PHYSICAL EXAMINATION: ECOG PERFORMANCE STATUS: 1 - Symptomatic but completely ambulatory  No acute distress GENERAL:alert, in no acute distress and comfortable SKIN: no  acute rashes, no significant lesions.  Mass over right posterior shoulder/upper back. EYES: conjunctiva are pink and non-injected, sclera anicteric OROPHARYNX: MMM, no exudates, no oropharyngeal erythema or ulceration NECK: supple, no JVD LYMPH:  no palpable lymphadenopathy in the cervical, axillary or inguinal regions LUNGS: clear to auscultation b/l with normal respiratory effort HEART: regular rate & rhythm ABDOMEN:  normoactive bowel sounds , non tender, not distended. Extremity: no pedal edema PSYCH: alert & oriented x 3 with fluent speech NEURO: no focal motor/sensory deficits  LABORATORY DATA:  I have reviewed the data as listed  . CBC Latest Ref Rng & Units 12/03/2020 11/19/2020 06/16/2020  WBC 4.0 - 10.5 K/uL 8.1 6.9 6.6  Hemoglobin 13.0 - 17.0 g/dL 16.4 16.2 16.2  Hematocrit 39.0 - 52.0 % 46.6 47.5 48.5  Platelets 150 - 400 K/uL 248 236 201    . CMP Latest Ref Rng & Units 12/03/2020 06/16/2020 02/23/2020  Glucose 70 - 99 mg/dL 92 92 92  BUN 6 - 20 mg/dL _0 Creatinine 0.61 - 1.24 mg/dL 0.71 0.88 1.00  Sodium 135 - 145 mmol/L 137 139 134(L)  Potassium 3.5 - 5.1 mmol/L 4.6 4.6 3.9  Chloride 98 - 111 mmol/L 105 104 98  CO2 22 - 32 mmol/L _1 Calcium 8.9 - 10.3 mg/dL 9.6 9.6 9.3  Total Protein 6.5 - 8.1 g/dL 7.9 8.1 7.7  Total Bilirubin 0.3 - 1.2 mg/dL 0.4 1.0 0.6  Alkaline Phos 38 - 126 U/L 123 98 79  AST 15 - 41 U/L _2 ALT 0 - 44 U/L 24 21 33   SURGICAL PATHOLOGY  CASE: [id number]  PATIENT: George West  Surgical Pathology Report   Clinical History: right posterior shoulder soft tissue mass (cm)   FINAL MICROSCOPIC DIAGNOSIS:   A. SOFT TISSUE MASS, RIGHT POSTERIOR SHOULDER, NEEDLE CORE BIOPSY:  -  Malignant melanoma  -  See comment   COMMENT:   By immunohistochemistry, the neoplastic cells are positive for S100,  HMB45, Sox 10 and Melan-A (patchy) but negative for CD45, CD138,  cytokeratin 7, cytokeratin 20, cytokeratin AE1/3, desmin  and EMA.  The  morphology and immunophenotype are consistent with malignant melanoma.  Dr. Irene Limbo was notified of these results on November 24, 2020.  Dr. Saralyn Pilar  reviewed the case and agrees with the above diagnosis.    RADIOGRAPHIC STUDIES: I have personally reviewed the radiological images as listed and agreed with the findings in the report. NM PET Image Initial (PI) Whole Body  Result Date: 12/03/2020 CLINICAL DATA:  Initial treatment strategy for metastatic melanoma diagnosed on biopsy of right shoulder mass 2 weeks prior. Plasma cell dyscrasia. EXAM: NUCLEAR MEDICINE PET WHOLE BODY TECHNIQUE: 8.7 mCi F-18 FDG was injected intravenously. Full-ring PET imaging was performed from the head to foot after the radiotracer. CT data was obtained and used for attenuation correction and anatomic localization. Fasting blood glucose: 114 mg/dl COMPARISON:  Low-dose chest CT 06/23/2020. Images from right shoulder biopsy 11/19/2020. FINDINGS: Mediastinal blood pool activity: SUV max 2.0 HEAD/ NECK: There is some misregistration between the PET and CT images. Multiple mildly enlarged right cervical lymph nodes are noted without definite associated hypermetabolic activity. Largest node is a level 4 node on the right, measuring 2.0 cm short axis on image 74/4. No hypermetabolic cervical lymph nodes are identified. Asymmetric activity along the right maxilla is likely related to periodontal disease. There is also mild asymmetric activity laterally in the left oropharynx without clear corresponding CT finding (SUV max 4.6).There are no definite lesions of the pharyngeal mucosal space. Incidental CT findings: none CHEST: There are multiple enlarged right axillary and subpectoral lymph nodes. These appear slightly more prominent than on the prior CT from February and include a 1.3 cm short axis subpectoral node on image 85/4 and a 1.3 cm right axillary node on image 95/4. These nodes appear only mildly hypermetabolic within  SUV max of 3.7. These nodes are in close proximity to a large hypermetabolic mass posterior to the right scapula which measures 5.2 x 4.0 cm on image 85/4. This mass demonstrates significant hypermetabolic activity with an SUV max of 22.4. No hypermetabolic mediastinal or hilar lymph nodes. There is no hypermetabolic pulmonary activity or suspicious pulmonary nodularity. Incidental CT findings: Mild centrilobular emphysema. ABDOMEN/PELVIS: There is no hypermetabolic activity within the liver, adrenal glands, spleen or pancreas. There is no hypermetabolic nodal activity. Incidental CT findings: Hepatic steatosis. Tiny nonobstructing calculi in the lower pole of the left kidney. Aortic and branch vessel atherosclerosis. SKELETON: There is no hypermetabolic activity to suggest osseous metastatic disease. Incidental CT findings: none EXTREMITIES: No suspicious metabolic activity identified within the extremities. Specifically, no cutaneous or subcutaneous lesion identified within the right upper extremity aside from the large hypermetabolic mass posterior to the right scapula. No hypermetabolic osseous activity or lytic lesions identified. Incidental CT findings: none IMPRESSION: 1. Large hypermetabolic soft tissue mass posterior to the right scapula consistent with known metastatic melanoma. 2. Numerous enlarged right axillary, subpectoral and right cervical lymph nodes are only mildly hypermetabolic, although appear progressive from previous chest CT and are suspicious for nodal metastases given the right shoulder soft tissue mass. 3. No distant metastases or primary cutaneous lesion identified. 4. No suspicious osseous findings. 5. Incidental findings as above, including hepatic steatosis, nonobstructing left renal calculi and aortic atherosclerosis. Electronically Signed   By: Richardean Sale M.D.   On: 12/03/2020 09:17   CT US GUIDED BIOPSY  Result Date: 11/19/2020 INDICATION: Right posterior shoulder soft  tissue mass EXAM: CT-guided biopsy of right posterior shoulder soft tissue mass MEDICATIONS: None. ANESTHESIA/SEDATION: Moderate (conscious) sedation was employed during this procedure. A total of Versed 1.5 mg and Fentanyl 75 mcg was administered intravenously. Moderate Sedation Time: 10 minutes. The patient's level of consciousness and vital signs were monitored continuously by radiology nursing throughout the procedure under my direct supervision. COMPLICATIONS: None immediate. PROCEDURE: Informed written consent was obtained from the patient after a thorough discussion of the procedural risks, benefits and alternatives. All questions were addressed. Maximal Sterile Barrier Technique was  utilized including caps, mask, sterile gowns, sterile gloves, sterile drape, hand hygiene and skin antiseptic. A timeout was performed prior to the initiation of the procedure. Patient position left lateral decubitus on the CT table. Left posterior shoulder skin prepped and draped in usual sterile fashion. Following local lidocaine administration, 17 gauge introducer needle was advanced into the left posterior shoulder mass utilizing CT guidance. Three cores were obtained from the left posterior shoulder mass and sent to pathology in formalin. Needle removed and hemostasis achieved with manual compression. IMPRESSION: CT-guided biopsy of left posterior shoulder mass as above. Electronically Signed   By: Miachel Roux M.D.   On: 11/19/2020 09:03     ASSESSMENT & PLAN:   60 yo with   1) IgM Kappa monoclonal paraproteinemia  ? Waldenstroms macroglobulinemia  2) newly diagnosed malignant melanoma likely metastatic.  Unclear primary site with the patient presented with a metastatic deposit in the soft tissue/skin over the right posterior shoulder. Patient also has multiple slightly hypermetabolic lymph nodes-right axillary, subpectoral and right cervical.  These could be from metastatic melanoma or a possible indolent  lymphoma related to his IgM kappa monoclonal paraproteinemia.  PLAN: -Discussed pt's labwork today, 12/03/2020; M spike is stable at 0.5 g/dL CBC unremarkable CMP unremarkable Discussed patient's PET CT scan with him in details.  Discussed surgical pathology showing malignant melanoma likely metastatic. Patient has follow-up with Dr. Barry Dienes and is scheduled for surgical excision of the right posterior shoulder mass hopefully sentinel lymph node biopsy can also be done.  Since her tumor is not arising from the surface of the skin and is present in the soft tissue this is likely metastatic lesion and not the primary site -Molecular testing was sent on the pathology sample and is BRAF positive -PD-L1 testing is pending -MRI of the brain with and without contrast for completion of staging of malignant melanoma.   FOLLOW UP MRI brain w/wo contrast in 2-3 days RTC with Dr Irene Limbo in 7-10 days  All of the patients questions were answered with apparent satisfaction. The patient knows to call the clinic with any problems, questions or concerns.  The total time spent in the appointment was 40 minutes and more than 50% was on counseling and direct patient cares.    Sullivan Lone MD [place] AAHIVMS [facility] Hematology/Oncology Physician [facility]  (Office):       [phone] (Work cell):  [phone] (Fax):           [phone]  12/10/2020 8:47 PM  I, Reinaldo Raddle, am acting as scribe for Dr. Sullivan Lone, MD.  .I have reviewed the above documentation for accuracy and completeness, and I agree with the above. Brunetta Genera MD

## 2020-12-11 ENCOUNTER — Other Ambulatory Visit: Payer: Self-pay

## 2020-12-11 ENCOUNTER — Inpatient Hospital Stay (HOSPITAL_BASED_OUTPATIENT_CLINIC_OR_DEPARTMENT_OTHER): Payer: BC Managed Care – PPO | Admitting: Hematology

## 2020-12-11 ENCOUNTER — Encounter (HOSPITAL_COMMUNITY): Payer: Self-pay | Admitting: General Surgery

## 2020-12-11 VITALS — BP 129/87 | HR 77 | Temp 98.7°F | Resp 18 | Ht 71.0 in | Wt 182.3 lb

## 2020-12-11 DIAGNOSIS — C4359 Malignant melanoma of other part of trunk: Secondary | ICD-10-CM | POA: Diagnosis not present

## 2020-12-11 DIAGNOSIS — D472 Monoclonal gammopathy: Secondary | ICD-10-CM

## 2020-12-11 NOTE — Progress Notes (Addendum)
I called the number listed as George West's number , George West answered the phone-she said she always speaks for patient. George West is a designated party to speak to.  George West states that George West has not had any chest pain, he does get short of breath when walking at a normal rate after 50 feet, patient was in the background and said he can walk up to 200 feet if he walk slow.

## 2020-12-11 NOTE — Progress Notes (Signed)
Called pathology  to have them add on PDL1 and BRAF testing for George West to his melanoma biopsy results.

## 2020-12-15 ENCOUNTER — Encounter (HOSPITAL_COMMUNITY)
Admission: RE | Disposition: A | Discharge status: Home / Self Care | Payer: Self-pay | Source: Home / Self Care | Attending: General Surgery

## 2020-12-15 ENCOUNTER — Ambulatory Visit (HOSPITAL_COMMUNITY): Payer: BC Managed Care – PPO | Admitting: Anesthesiology

## 2020-12-15 ENCOUNTER — Ambulatory Visit (HOSPITAL_COMMUNITY)
Admission: RE | Admit: 2020-12-15 | Discharge: 2020-12-15 | Disposition: A | Discharge status: Home / Self Care | Payer: BC Managed Care – PPO | Attending: General Surgery | Admitting: General Surgery

## 2020-12-15 ENCOUNTER — Encounter (HOSPITAL_COMMUNITY): Payer: Self-pay | Admitting: General Surgery

## 2020-12-15 DIAGNOSIS — J441 Chronic obstructive pulmonary disease with (acute) exacerbation: Secondary | ICD-10-CM | POA: Diagnosis not present

## 2020-12-15 DIAGNOSIS — R0602 Shortness of breath: Secondary | ICD-10-CM | POA: Insufficient documentation

## 2020-12-15 DIAGNOSIS — C792 Secondary malignant neoplasm of skin: Secondary | ICD-10-CM | POA: Diagnosis not present

## 2020-12-15 DIAGNOSIS — J449 Chronic obstructive pulmonary disease, unspecified: Secondary | ICD-10-CM | POA: Insufficient documentation

## 2020-12-15 DIAGNOSIS — Z87891 Personal history of nicotine dependence: Secondary | ICD-10-CM | POA: Insufficient documentation

## 2020-12-15 DIAGNOSIS — Z79899 Other long term (current) drug therapy: Secondary | ICD-10-CM | POA: Insufficient documentation

## 2020-12-15 DIAGNOSIS — Z7951 Long term (current) use of inhaled steroids: Secondary | ICD-10-CM | POA: Insufficient documentation

## 2020-12-15 DIAGNOSIS — Z803 Family history of malignant neoplasm of breast: Secondary | ICD-10-CM | POA: Insufficient documentation

## 2020-12-15 DIAGNOSIS — Z808 Family history of malignant neoplasm of other organs or systems: Secondary | ICD-10-CM | POA: Insufficient documentation

## 2020-12-15 DIAGNOSIS — E559 Vitamin D deficiency, unspecified: Secondary | ICD-10-CM | POA: Diagnosis not present

## 2020-12-15 DIAGNOSIS — C7989 Secondary malignant neoplasm of other specified sites: Secondary | ICD-10-CM | POA: Diagnosis not present

## 2020-12-15 DIAGNOSIS — C4361 Malignant melanoma of right upper limb, including shoulder: Secondary | ICD-10-CM | POA: Diagnosis not present

## 2020-12-15 DIAGNOSIS — K760 Fatty (change of) liver, not elsewhere classified: Secondary | ICD-10-CM | POA: Diagnosis not present

## 2020-12-15 HISTORY — DX: Malignant (primary) neoplasm, unspecified: C80.1

## 2020-12-15 HISTORY — PX: MASS EXCISION: SHX2000

## 2020-12-15 HISTORY — DX: Chronic obstructive pulmonary disease, unspecified: J44.9

## 2020-12-15 HISTORY — DX: Depression, unspecified: F32.A

## 2020-12-15 HISTORY — DX: Fatty (change of) liver, not elsewhere classified: K76.0

## 2020-12-15 HISTORY — DX: Dyspnea, unspecified: R06.00

## 2020-12-15 HISTORY — DX: Personal history of urinary calculi: Z87.442

## 2020-12-15 SURGERY — EXCISION MASS
Anesthesia: General | Site: Shoulder | Laterality: Right

## 2020-12-15 MED ORDER — MIDAZOLAM HCL 2 MG/2ML IJ SOLN
INTRAMUSCULAR | Status: DC | PRN
  Administered 2020-12-15: 2 mg via INTRAVENOUS

## 2020-12-15 MED ORDER — LIDOCAINE 2% (20 MG/ML) 5 ML SYRINGE
INTRAMUSCULAR | Status: AC
  Filled 2020-12-15: qty 10

## 2020-12-15 MED ORDER — OXYCODONE HCL 5 MG PO TABS: 5.0000 mg | ORAL_TABLET | Freq: Four times a day (QID) | ORAL | 0 refills | Status: DC | PRN

## 2020-12-15 MED ORDER — DEXMEDETOMIDINE HCL IN NACL 200 MCG/50ML IV SOLN
INTRAVENOUS | Status: DC | PRN
  Administered 2020-12-15: 20 ug via INTRAVENOUS

## 2020-12-15 MED ORDER — DEXAMETHASONE SODIUM PHOSPHATE 10 MG/ML IJ SOLN
INTRAMUSCULAR | Status: AC
  Filled 2020-12-15: qty 2

## 2020-12-15 MED ORDER — METHOCARBAMOL 500 MG PO TABS: 500.0000 mg | ORAL_TABLET | Freq: Four times a day (QID) | ORAL | 1 refills | Status: DC

## 2020-12-15 MED ORDER — PROPOFOL 10 MG/ML IV BOLUS
INTRAVENOUS | Status: DC | PRN
  Administered 2020-12-15: 150 mg via INTRAVENOUS

## 2020-12-15 MED ORDER — DEXMEDETOMIDINE HCL IN NACL 200 MCG/50ML IV SOLN
INTRAVENOUS | Status: AC
  Filled 2020-12-15: qty 50

## 2020-12-15 MED ORDER — ORAL CARE MOUTH RINSE: 15.0000 mL | Freq: Once | OROMUCOSAL | Status: AC

## 2020-12-15 MED ORDER — MIDAZOLAM HCL 2 MG/2ML IJ SOLN
INTRAMUSCULAR | Status: AC
  Filled 2020-12-15: qty 2

## 2020-12-15 MED ORDER — CHLORHEXIDINE GLUCONATE 0.12 % MT SOLN
15.0000 mL | Freq: Once | OROMUCOSAL | Status: AC
  Administered 2020-12-15: 15 mL via OROMUCOSAL
  Filled 2020-12-15: qty 15

## 2020-12-15 MED ORDER — BUPIVACAINE HCL 0.25 % IJ SOLN
INTRAMUSCULAR | Status: DC | PRN
  Administered 2020-12-15: 20 mL

## 2020-12-15 MED ORDER — PROPOFOL 10 MG/ML IV BOLUS
INTRAVENOUS | Status: AC
  Filled 2020-12-15: qty 20

## 2020-12-15 MED ORDER — LACTATED RINGERS IV SOLN: INTRAVENOUS | Status: DC

## 2020-12-15 MED ORDER — BUPIVACAINE LIPOSOME 1.3 % IJ SUSP
INTRAMUSCULAR | Status: AC
  Filled 2020-12-15: qty 20

## 2020-12-15 MED ORDER — DEXAMETHASONE SODIUM PHOSPHATE 10 MG/ML IJ SOLN
INTRAMUSCULAR | Status: DC | PRN
  Administered 2020-12-15: 10 mg via INTRAVENOUS

## 2020-12-15 MED ORDER — ROCURONIUM BROMIDE 10 MG/ML (PF) SYRINGE
PREFILLED_SYRINGE | INTRAVENOUS | Status: DC | PRN
  Administered 2020-12-15: 70 mg via INTRAVENOUS

## 2020-12-15 MED ORDER — FENTANYL CITRATE (PF) 250 MCG/5ML IJ SOLN
INTRAMUSCULAR | Status: AC
  Filled 2020-12-15: qty 5

## 2020-12-15 MED ORDER — CHLORHEXIDINE GLUCONATE CLOTH 2 % EX PADS: 6.0000 | MEDICATED_PAD | Freq: Once | CUTANEOUS | Status: DC

## 2020-12-15 MED ORDER — CEFAZOLIN SODIUM-DEXTROSE 2-4 GM/100ML-% IV SOLN
2.0000 g | INTRAVENOUS | Status: AC
  Administered 2020-12-15: 2 g via INTRAVENOUS
  Filled 2020-12-15: qty 100

## 2020-12-15 MED ORDER — BUPIVACAINE HCL (PF) 0.25 % IJ SOLN
INTRAMUSCULAR | Status: AC
  Filled 2020-12-15: qty 30

## 2020-12-15 MED ORDER — ACETAMINOPHEN 500 MG PO TABS
1000.0000 mg | ORAL_TABLET | ORAL | Status: AC
  Administered 2020-12-15: 1000 mg via ORAL
  Filled 2020-12-15: qty 2

## 2020-12-15 MED ORDER — ONDANSETRON HCL 4 MG/2ML IJ SOLN
INTRAMUSCULAR | Status: AC
  Filled 2020-12-15: qty 4

## 2020-12-15 MED ORDER — ROCURONIUM BROMIDE 10 MG/ML (PF) SYRINGE
PREFILLED_SYRINGE | INTRAVENOUS | Status: AC
  Filled 2020-12-15: qty 20

## 2020-12-15 MED ORDER — ONDANSETRON HCL 4 MG/2ML IJ SOLN
INTRAMUSCULAR | Status: DC | PRN
  Administered 2020-12-15: 4 mg via INTRAVENOUS

## 2020-12-15 MED ORDER — 0.9 % SODIUM CHLORIDE (POUR BTL) OPTIME
TOPICAL | Status: DC | PRN
  Administered 2020-12-15: 1000 mL

## 2020-12-15 MED ORDER — ALBUTEROL SULFATE (2.5 MG/3ML) 0.083% IN NEBU
INHALATION_SOLUTION | RESPIRATORY_TRACT | Status: AC
  Filled 2020-12-15: qty 3

## 2020-12-15 MED ORDER — PHENYLEPHRINE 40 MCG/ML (10ML) SYRINGE FOR IV PUSH (FOR BLOOD PRESSURE SUPPORT)
PREFILLED_SYRINGE | INTRAVENOUS | Status: AC
  Filled 2020-12-15: qty 30

## 2020-12-15 MED ORDER — ESMOLOL HCL 100 MG/10ML IV SOLN
INTRAVENOUS | Status: AC
  Filled 2020-12-15: qty 10

## 2020-12-15 MED ORDER — BUPIVACAINE LIPOSOME 1.3 % IJ SUSP
INTRAMUSCULAR | Status: DC | PRN
  Administered 2020-12-15: 20 mL

## 2020-12-15 MED ORDER — LIDOCAINE 2% (20 MG/ML) 5 ML SYRINGE
INTRAMUSCULAR | Status: DC | PRN
Start: 2020-12-15 — End: 2020-12-15
  Administered 2020-12-15: 80 mg via INTRAVENOUS

## 2020-12-15 MED ORDER — ESMOLOL HCL 100 MG/10ML IV SOLN
INTRAVENOUS | Status: DC | PRN
  Administered 2020-12-15: 40 mg via INTRAVENOUS

## 2020-12-15 MED ORDER — EPHEDRINE 5 MG/ML INJ
INTRAVENOUS | Status: AC
  Filled 2020-12-15: qty 5

## 2020-12-15 MED ORDER — SUGAMMADEX SODIUM 200 MG/2ML IV SOLN
INTRAVENOUS | Status: DC | PRN
  Administered 2020-12-15: 200 mg via INTRAVENOUS

## 2020-12-15 MED ORDER — FENTANYL CITRATE (PF) 250 MCG/5ML IJ SOLN
INTRAMUSCULAR | Status: DC | PRN
  Administered 2020-12-15: 50 ug via INTRAVENOUS
  Administered 2020-12-15: 25 ug via INTRAVENOUS

## 2020-12-15 SURGICAL SUPPLY — 38 items
BAG COUNTER SPONGE SURGICOUNT (BAG) ×2 IMPLANT
BENZOIN TINCTURE PRP APPL 2/3 (GAUZE/BANDAGES/DRESSINGS) ×2 IMPLANT
CANISTER SUCT 3000ML PPV (MISCELLANEOUS) ×2 IMPLANT
CHLORAPREP W/TINT 26 (MISCELLANEOUS) ×2 IMPLANT
CLIP VESOCCLUDE MED 24/CT (CLIP) ×2 IMPLANT
COVER SURGICAL LIGHT HANDLE (MISCELLANEOUS) ×2 IMPLANT
DERMABOND ADVANCED (GAUZE/BANDAGES/DRESSINGS)
DERMABOND ADVANCED .7 DNX12 (GAUZE/BANDAGES/DRESSINGS) IMPLANT
DRAPE LAPAROSCOPIC ABDOMINAL (DRAPES) ×2 IMPLANT
DRAPE LAPAROTOMY 100X72 PEDS (DRAPES) IMPLANT
DRSG TEGADERM 4X10 (GAUZE/BANDAGES/DRESSINGS) ×2 IMPLANT
DRSG TEGADERM 4X4.75 (GAUZE/BANDAGES/DRESSINGS) ×2 IMPLANT
ELECT REM PT RETURN 9FT ADLT (ELECTROSURGICAL) ×2
ELECTRODE REM PT RTRN 9FT ADLT (ELECTROSURGICAL) ×1 IMPLANT
GAUZE 4X4 16PLY ~~LOC~~+RFID DBL (SPONGE) ×2 IMPLANT
GAUZE SPONGE 4X4 12PLY STRL (GAUZE/BANDAGES/DRESSINGS) ×2 IMPLANT
GLOVE SURG ENC MOIS LTX SZ6 (GLOVE) ×2 IMPLANT
GLOVE SURG UNDER LTX SZ6.5 (GLOVE) ×2 IMPLANT
GOWN STRL REUS W/ TWL LRG LVL3 (GOWN DISPOSABLE) ×1 IMPLANT
GOWN STRL REUS W/TWL 2XL LVL3 (GOWN DISPOSABLE) ×2 IMPLANT
GOWN STRL REUS W/TWL LRG LVL3 (GOWN DISPOSABLE) ×1
KIT BASIN OR (CUSTOM PROCEDURE TRAY) ×2 IMPLANT
KIT TURNOVER KIT B (KITS) ×2 IMPLANT
NEEDLE HYPO 25GX1X1/2 BEV (NEEDLE) ×2 IMPLANT
NS IRRIG 1000ML POUR BTL (IV SOLUTION) ×2 IMPLANT
PACK GENERAL/GYN (CUSTOM PROCEDURE TRAY) ×2 IMPLANT
PAD ARMBOARD 7.5X6 YLW CONV (MISCELLANEOUS) ×4 IMPLANT
PENCIL SMOKE EVACUATOR (MISCELLANEOUS) ×2 IMPLANT
SPECIMEN JAR SMALL (MISCELLANEOUS) ×2 IMPLANT
SPONGE T-LAP 4X18 ~~LOC~~+RFID (SPONGE) IMPLANT
STRIP CLOSURE SKIN 1/2X4 (GAUZE/BANDAGES/DRESSINGS) ×2 IMPLANT
SUT MON AB 4-0 PC3 18 (SUTURE) ×2 IMPLANT
SUT SILK 2 0 PERMA HAND 18 BK (SUTURE) ×4 IMPLANT
SUT VIC AB 3-0 SH 27 (SUTURE) ×1
SUT VIC AB 3-0 SH 27X BRD (SUTURE) ×1 IMPLANT
SYR CONTROL 10ML LL (SYRINGE) ×2 IMPLANT
TOWEL GREEN STERILE (TOWEL DISPOSABLE) ×2 IMPLANT
TOWEL GREEN STERILE FF (TOWEL DISPOSABLE) ×2 IMPLANT

## 2020-12-15 NOTE — Discharge Instructions (Addendum)
[place] Office Phone Number [phone]   POST OP INSTRUCTIONS  Always review your discharge instruction sheet given to you by the facility where your surgery was performed.  IF YOU HAVE DISABILITY OR FAMILY LEAVE FORMS, YOU MUST BRING THEM TO THE OFFICE FOR PROCESSING.  DO NOT GIVE THEM TO YOUR DOCTOR.  A prescription for pain medication may be given to you upon discharge.  Take your pain medication as prescribed, if needed.  If narcotic pain medicine is not needed, then you may take acetaminophen (Tylenol) or ibuprofen (Advil) as needed. Take your usually prescribed medications unless otherwise directed If you need a refill on your pain medication, please contact your pharmacy.  They will contact our office to request authorization.  Prescriptions will not be filled after 5pm or on week-ends. You should eat very light the first 24 hours after surgery, such as soup, crackers, pudding, etc.  Resume your normal diet the day after surgery It is common to experience some constipation if taking pain medication after surgery.  Increasing fluid intake and taking a stool softener will usually help or prevent this problem from occurring.  A mild laxative (Milk of Magnesia or Miralax) should be taken according to package directions if there are no bowel movements after 48 hours. You may shower in 48 hours.  The surgical glue will flake off in 2-3 weeks.   ACTIVITIES:  No strenuous activity or heavy lifting for 1 week.   You may drive when you no longer are taking prescription pain medication, you can comfortably wear a seatbelt, and you can safely maneuver your car and apply brakes. RETURN TO WORK:  __________to be determined, but NO lifting for 2 weeks.  _______________ Dennis Bast should see your doctor in the office for a follow-up appointment approximately three-four weeks after your surgery.    WHEN TO CALL YOUR DOCTOR: Fever over 101.0 Nausea and/or vomiting. Extreme swelling or  bruising. Continued bleeding from incision. Increased pain, redness, or drainage from the incision.  The clinic staff is available to answer your questions during regular business hours.  Please don't hesitate to call and ask to speak to one of the nurses for clinical concerns.  If you have a medical emergency, go to the nearest emergency room or call 911.  A surgeon from [facility] is always on call at the hospital.  For further questions, please visit [email or web address]

## 2020-12-15 NOTE — Transfer of Care (Signed)
Immediate Anesthesia Transfer of Care Note  Patient: George West  Procedure(s) Performed: EXCISION OF MALIGNANT MASS RIGHT POSTERIOR SHOULDER (Right: Shoulder)  Patient Location: PACU  Anesthesia Type:General  Level of Consciousness: drowsy, patient cooperative and responds to stimulation  Airway & Oxygen Therapy: Patient Spontanous Breathing  Post-op Assessment: Report given to RN and Post -op Vital signs reviewed and stable  Post vital signs: Reviewed and stable  Last Vitals:  Vitals Value Taken Time  BP 109/70 12/15/20 1521  Temp    Pulse 71 12/15/20 1522  Resp 17 12/15/20 1522  SpO2 91 % 12/15/20 1522  Vitals shown include unvalidated device data.  Last Pain:  Vitals:   12/15/20 1103  TempSrc:   PainSc: 0-No pain         Complications: No notable events documented.

## 2020-12-15 NOTE — Anesthesia Preprocedure Evaluation (Signed)
Anesthesia Evaluation  Patient identified by MRN, date of birth, ID band Patient awake    Reviewed: Allergy & Precautions, NPO status , Patient's Chart, lab work & pertinent test results  Airway Mallampati: II  TM Distance: >3 FB Neck ROM: Full    Dental no notable dental hx.    Pulmonary COPD, Current Smoker and Patient abstained from smoking.,    Pulmonary exam normal breath sounds clear to auscultation       Cardiovascular negative cardio ROS Normal cardiovascular exam Rhythm:Regular Rate:Normal     Neuro/Psych PSYCHIATRIC DISORDERS Depression negative neurological ROS     GI/Hepatic negative GI ROS, Fatty liver   Endo/Other  negative endocrine ROS  Renal/GU negative Renal ROS  negative genitourinary   Musculoskeletal negative musculoskeletal ROS (+)   Abdominal   Peds negative pediatric ROS (+)  Hematology negative hematology ROS (+)   Anesthesia Other Findings   Reproductive/Obstetrics negative OB ROS                             Anesthesia Physical Anesthesia Plan  ASA: 2  Anesthesia Plan: General   Post-op Pain Management:    Induction: Intravenous  PONV Risk Score and Plan: Ondansetron, Dexamethasone and Treatment may vary due to age or medical condition  Airway Management Planned: Oral ETT  Additional Equipment:   Intra-op Plan:   Post-operative Plan: Extubation in OR  Informed Consent:   Plan Discussed with: CRNA  Anesthesia Plan Comments:         Anesthesia Quick Evaluation

## 2020-12-15 NOTE — Interval H&P Note (Signed)
History and Physical Interval Note:  12/15/2020 1:26 PM  George West  has presented today for surgery, with the diagnosis of [place].  The various methods of treatment have been discussed with the patient and family. After consideration of risks, benefits and other options for treatment, the patient has consented to  Procedure(s): EXCISION OF MALIGNANT MASS RIGHT SHOULDER (Right) as a surgical intervention.  The patient's history has been reviewed, patient examined, no change in status, stable for surgery.  I have reviewed the patient's chart and labs.  Questions were answered to the patient's satisfaction.     Stark Klein

## 2020-12-15 NOTE — H&P (Signed)
Chief Complaint: Melanoma   History of Present Illness: George West is a 60 y.o. male who is seen today for melanoma follow up.  George West is a 60 year old male who presented to Dr. Thermon Leyland 10/2020 with a right upper back / right axillary mass which had been slowly growing with time and started to cause pain down his arm.  It limits his ability to do his work and write with his right arm.  He has become concerned about it and was referred by Dr. Silvio Pate for removal.  Back in February he was seen by Dr. Irene Limbo, Medical Oncologist at the [facility], for IgM Kappa monoclonal paraproteinemia.   He underwent core needle biopsy and this showed malignant melanoma. He then had a PET scan which showed the hypermetabolic mass. There was lymphadenopathy seen as well in the axilla, subpectoral and cervical regions, but this was minimally hypermetabolic. He has had a chest CT in February for lung cancer screening and the LAD was progressive since that.   In more detail, the mass has been present for several years with slow enlargement, but according to his wife, it started getting much larger around 3 months ago. It continues to hurt more and more, even more so when he saw Dr. Thermon Leyland. He is having issues sleeping. He is also concerned about something similar on the other side.   Review of Systems: A complete review of systems was obtained from the patient. I have reviewed this information and discussed as appropriate with the patient. See HPI as well for other ROS.  Review of Systems  Respiratory: Positive for cough and shortness of breath.  Gastrointestinal: Positive for abdominal pain.  Psychiatric/Behavioral: Positive for memory loss and substance abuse.  All other systems reviewed and are negative.   Medical History: Past Medical History:  Diagnosis Date   Asthma, unspecified asthma severity, unspecified whether complicated, unspecified whether persistent   COPD  (chronic obstructive pulmonary disease) (CMS-HCC)   History of cancer   Substance abuse (CMS-HCC)   Patient Active Problem List  Diagnosis   Melanoma of shoulder, right (CMS-HCC)   No past surgical history on file.   No Known Allergies  Current Outpatient Medications on File Prior to Visit  Medication Sig Dispense Refill   albuterol 90 mcg/actuation inhaler Inhale into the lungs   buPROPion (WELLBUTRIN) 100 MG tablet Take 100 mg by mouth 2 (two) times daily   TRELEGY ELLIPTA 100-62.5-25 mcg inhaler Inhale 1 inhalation into the lungs once daily   No current facility-administered medications on file prior to visit.   Family History  Problem Relation Age of Onset   Skin cancer Mother   Breast cancer Sister    Social History   Tobacco Use  Smoking Status Former Smoker  Smokeless Tobacco Never Used    Social History   Socioeconomic History   Marital status: Married  Tobacco Use   Smoking status: Former Smoker   Smokeless tobacco: Never Used  Substance and Sexual Activity   Alcohol use: Yes  Alcohol/week: 4.0 standard drinks  Types: 4 Cans of beer per week  Comment: 4 beers daily and 2 shots of alcohol   Objective:   Vitals:  12/08/20 1531  BP: 110/78  Pulse: 95  Temp: 36.6 C (97.8 F)  SpO2: 95%  Weight: 82 kg (180 lb 12.8 oz)  Height: 182.9 cm (6')   Body mass index is 24.52 kg/m.  Head: Normocephalic and atraumatic.  Eyes: Conjunctivae are normal. Pupils  are equal, round, and reactive to light. No scleral icterus.  Neck: Normal range of motion. Neck supple. No tracheal deviation present. No thyromegaly present.  Resp: No respiratory distress, normal effort. Abd: Abdomen is soft, non distended and non tender. No masses are palpable. There is no rebound and no guarding.  Neurological: Alert and oriented to person, place, and time. Coordination normal.  Skin: Skin is warm and dry. No rash noted. No diaphoretic. No erythema. No pallor. There is a 5x6 cm  tender mass with thinning and congestion of the overlying skin over the scapula below the acromion. It is mobile.  Psychiatric: Normal mood and affect. Normal behavior. Judgment and thought content normal.  Lymphatic: right palpable cervical nodes and axillary nodes.   Labs, Imaging and Diagnostic Testing:  Pathology 11/19/20 core needle biopsy . SOFT TISSUE MASS, RIGHT POSTERIOR SHOULDER, NEEDLE CORE BIOPSY:  -  Malignant melanoma   PET scan 12/03/20 IMPRESSION: 1. Large hypermetabolic soft tissue mass posterior to the right scapula consistent with known metastatic melanoma. 2. Numerous enlarged right axillary, subpectoral and right cervical lymph nodes are only mildly hypermetabolic, although appear progressive from previous chest CT and are suspicious for nodal metastases given the right shoulder soft tissue mass. 3. No distant metastases or primary cutaneous lesion identified. 4. No suspicious osseous findings. 5. Incidental findings as above, including hepatic steatosis, nonobstructing left renal calculi and aortic atherosclerosis.    Assessment and Plan:  Diagnoses and all orders for this visit:  Melanoma of shoulder, right (CMS-HCC) Assessment & Plan: Normally I would recommend neoadjuvant immunotherapy given the enlarged nodes, but this mass is painful and growing. I will plan excision.  I discussed surgery with the patient and his wife. I reviewed the incision type as well as post op risks like weakness, numbness, wound issues, pain, recurrence and more.   I also reviewed port placement in case he discusses this with Dr. Irene Limbo. He has great veins. He may go for peripheral IVs for immunotherapy.   Given the fact that this is subcutaneous, this is a metastasis, but there is no apparent primary. Once we get treatment initiated, will refer to dermatology for skin survey.   I will do this as soon as possible.     Return for surgery.  Georgianne Fick, MD  [place], MD at  12/08/2020 4:21 PM EDT

## 2020-12-15 NOTE — Progress Notes (Signed)
Orthopedic Tech Progress Note Patient Details:  George West 1960/08/08 [id number]  PACU RN called requesting an [place] for patient. But once I opened up the orders he needed a shoulder immobilizer instead  Ortho Devices Type of Ortho Device: Arm sling Ortho Device/Splint Location: RUE Ortho Device/Splint Interventions: Application, Adjustment   Post Interventions Patient Tolerated: Well Instructions Provided: [facility] [place] 12/15/2020, 4:32 PM

## 2020-12-15 NOTE — Anesthesia Procedure Notes (Addendum)
Procedure Name: Intubation Date/Time: 12/15/2020 1:56 PM Performed by: Donnelly Angelica, RN Pre-anesthesia Checklist: Patient identified, Emergency Drugs available, Suction available and Patient being monitored Patient Re-evaluated:Patient Re-evaluated prior to induction Oxygen Delivery Method: Circle System Utilized Preoxygenation: Pre-oxygenation with 100% oxygen Induction Type: IV induction Ventilation: Mask ventilation without difficulty Laryngoscope Size: Mac and 4 Grade View: Grade II Tube type: Oral Tube size: 7.5 mm Number of attempts: 1 Airway Equipment and Method: Stylet and Oral airway Placement Confirmation: ETT inserted through vocal cords under direct vision, positive ETCO2 and breath sounds checked- equal and bilateral Secured at: 23 cm Tube secured with: Tape Dental Injury: Teeth and Oropharynx as per pre-operative assessment

## 2020-12-15 NOTE — Op Note (Signed)
PRE-OPERATIVE DIAGNOSIS: metastatic melanoma to right posterior shoulder  POST-OPERATIVE DIAGNOSIS:  Same  PROCEDURE:  Procedure(s): Resection of malignant mass 8 x 5 x 4 cm right posterior shoulder  SURGEON:  Surgeon(s): Stark Klein, MD  ASSISTANT: Zacarias Pontes, MD  ANESTHESIA:   local and general  DRAINS: none   LOCAL MEDICATIONS USED:  BUPIVICAINE   SPECIMEN:  Source of Specimen:  mass right posterior shoulder  DISPOSITION OF SPECIMEN:  PATHOLOGY  COUNTS:  YES  DICTATION: .Dragon Dictation  PLAN OF CARE: Discharge to home after PACU  PATIENT DISPOSITION:  PACU - hemodynamically stable.  FINDINGS:  firm mass adherent to latissimus and teres major  EBL: 100 mL  PROCEDURE:  Patient was identified in the holding area and taken to the operating room where he was intubated on the stretcher.  He was then flipped into the prone position with appropriate padding.  Thyroid intubation sequential compression devices were placed.  Following prone positioning, active warming was placed.  His upper back and right shoulder were prepped and draped in sterile fashion.  A timeout was performed according to the surgical safety checklist.  When all was correct, we continued.  An oblique incision was drawn over the mass.  This was just below the angle of the scapula.  The skin was infiltrated with a mixture of Marcaine and Exparel.  The incision was made with a #10 blade.  The subcutaneous tissues were divided with the cautery.  Skin hooks were used to elevate the skin and the cautery was used to create skin flaps over the mass.  Care was taken not to enter the mass and to obtain a margin of muscle and fatty tissue around the tumor.  The tumor was taken en bloc with adjacent muscle.  This incorporated part of the latissimus as well as part of the teres.  There was a arterial branch entering the deep aspect of the mass.  This required suture ligation.  The posterior aspect of the mass did have a  natural border and was well encapsulated other than at the vasculature.  The cavity was then irrigated.  Hemostasis was achieved with cautery.  Clips were placed around the margin radially and at the deep margin in case radiation is needed.  The muscle defect was too large to close primarily.  The deeper skin and subcutaneous tissue was pulled together with 2-0 Vicryl sutures to alleviate tension.  The skin was then closed using 3-0 Vicryl deep dermal interrupted suture and 4-0 Monocryl running subcuticular suture.  The skin was then cleaned, dried, and dressed with benzoin, Steri-Strips, gauze, and Tegaderm.    The patient was flipped back supine on the stretcher, then was allowed to emerge from anesthesia.  He was taken to the PACU in stable condition.  Needle, sponge, and instrument counts are correct x2.

## 2020-12-16 ENCOUNTER — Other Ambulatory Visit: Payer: Self-pay | Admitting: Internal Medicine

## 2020-12-16 ENCOUNTER — Encounter (HOSPITAL_COMMUNITY): Payer: Self-pay | Admitting: General Surgery

## 2020-12-16 NOTE — Anesthesia Postprocedure Evaluation (Signed)
Anesthesia Post Note  Patient: George West  Procedure(s) Performed: EXCISION OF MALIGNANT MASS RIGHT POSTERIOR SHOULDER (Right: Shoulder)     Patient location during evaluation: PACU Anesthesia Type: General Level of consciousness: awake and alert Pain management: pain level controlled Vital Signs Assessment: post-procedure vital signs reviewed and stable Respiratory status: spontaneous breathing, nonlabored ventilation, respiratory function stable and patient connected to nasal cannula oxygen Cardiovascular status: blood pressure returned to baseline and stable Postop Assessment: no apparent nausea or vomiting Anesthetic complications: no   No notable events documented.  Last Vitals:  Vitals:   12/15/20 1545 12/15/20 1550  BP:  120/65  Pulse:  72  Resp:    Temp:    SpO2: 96% 95%    Last Pain:  Vitals:   12/15/20 1550  TempSrc:   PainSc: 0-No pain                 Merlinda Frederick

## 2020-12-17 ENCOUNTER — Ambulatory Visit: Payer: BC Managed Care – PPO | Admitting: Internal Medicine

## 2020-12-17 LAB — SURGICAL PATHOLOGY

## 2020-12-18 ENCOUNTER — Telehealth: Payer: Self-pay | Admitting: Internal Medicine

## 2020-12-18 NOTE — Telephone Encounter (Signed)
Pt's wife called in stating Dr. Silvio Pate has been trying to reach George West and was asking to speak with him. I advised her I would send a message over as I did not see any messages stating anyone had tried to reach them. Please advise

## 2020-12-18 NOTE — Telephone Encounter (Signed)
I spoke to her about the recent procedure (removal of malignant melanoma) Will be starting immunotherapy with oncology  Having stomach trouble---worried about an ulcer Has appt scheduled----he is no longer taking aleve Asked her to have him start omeprazole '20mg'$  daily on empty stomach to see if that helps

## 2020-12-19 ENCOUNTER — Telehealth: Payer: Self-pay

## 2020-12-19 ENCOUNTER — Ambulatory Visit (HOSPITAL_COMMUNITY)
Admission: RE | Admit: 2020-12-19 | Discharge: 2020-12-19 | Disposition: A | Discharge status: Home / Self Care | Payer: BC Managed Care – PPO | Source: Ambulatory Visit | Attending: Hematology | Admitting: Hematology

## 2020-12-19 ENCOUNTER — Other Ambulatory Visit: Payer: Self-pay

## 2020-12-19 DIAGNOSIS — Z85820 Personal history of malignant melanoma of skin: Secondary | ICD-10-CM | POA: Diagnosis not present

## 2020-12-19 DIAGNOSIS — G319 Degenerative disease of nervous system, unspecified: Secondary | ICD-10-CM | POA: Diagnosis not present

## 2020-12-19 DIAGNOSIS — C439 Malignant melanoma of skin, unspecified: Secondary | ICD-10-CM | POA: Diagnosis not present

## 2020-12-19 DIAGNOSIS — D485 Neoplasm of uncertain behavior of skin: Secondary | ICD-10-CM | POA: Diagnosis not present

## 2020-12-19 DIAGNOSIS — D225 Melanocytic nevi of trunk: Secondary | ICD-10-CM | POA: Diagnosis not present

## 2020-12-19 DIAGNOSIS — L905 Scar conditions and fibrosis of skin: Secondary | ICD-10-CM | POA: Diagnosis not present

## 2020-12-19 DIAGNOSIS — C4359 Malignant melanoma of other part of trunk: Secondary | ICD-10-CM | POA: Insufficient documentation

## 2020-12-19 MED ORDER — GADOBUTROL 1 MMOL/ML IV SOLN
8.2000 mL | Freq: Once | INTRAVENOUS | Status: AC | PRN
  Administered 2020-12-19: 8.2 mL via INTRAVENOUS

## 2020-12-19 NOTE — Telephone Encounter (Signed)
Contacted pt per Dr Irene Limbo to : let him know his MRI shows no evidence of intracranial metastases from his melanoma.  Small spot at could possibly be a very small stroke or artifact.  Would recommend he start taking baby aspirin 81 mg p.o. daily to reduce stroke risk. Pt verbalized understanding.

## 2020-12-23 ENCOUNTER — Telehealth: Payer: Self-pay | Admitting: Hematology

## 2020-12-23 NOTE — Telephone Encounter (Signed)
Rescheduled upcoming appointment due to provider's emergency. Patient's wife is aware of changes.

## 2020-12-24 ENCOUNTER — Inpatient Hospital Stay: Payer: BC Managed Care – PPO | Attending: Hematology | Admitting: Oncology

## 2020-12-24 ENCOUNTER — Other Ambulatory Visit: Payer: Self-pay

## 2020-12-24 VITALS — BP 117/81 | HR 59 | Temp 97.5°F | Resp 18 | Wt 177.2 lb

## 2020-12-24 DIAGNOSIS — C4359 Malignant melanoma of other part of trunk: Secondary | ICD-10-CM | POA: Insufficient documentation

## 2020-12-24 DIAGNOSIS — Z5112 Encounter for antineoplastic immunotherapy: Secondary | ICD-10-CM | POA: Diagnosis not present

## 2020-12-24 DIAGNOSIS — C4360 Malignant melanoma of unspecified upper limb, including shoulder: Secondary | ICD-10-CM | POA: Insufficient documentation

## 2020-12-24 DIAGNOSIS — Z79899 Other long term (current) drug therapy: Secondary | ICD-10-CM | POA: Diagnosis not present

## 2020-12-24 MED ORDER — PROCHLORPERAZINE MALEATE 10 MG PO TABS: 10.0000 mg | ORAL_TABLET | Freq: Four times a day (QID) | ORAL | 0 refills | Status: DC | PRN

## 2020-12-24 NOTE — Progress Notes (Signed)
START ON PATHWAY REGIMEN - Melanoma and Other Skin Cancers     Cycles 1 through 4: A cycle is every 21 days:     Nivolumab      Ipilimumab    Cycles 5 and beyond: A cycle is every 14 days:     Nivolumab   **Always confirm dose/schedule in your pharmacy ordering system**  Patient Characteristics: Melanoma, Cutaneous/Unknown Primary, Distant Metastases or Unresectable Local Recurrence, Unresectable, Asymptomatic, First Line, BRAF V600 Activating Mutation Positive Disease Classification: Melanoma Disease Subtype: Cutaneous BRAF V600 Mutation Status: BRAF V600 Activating Mutation Positive Therapeutic Status: Distant Metastases Metastatic Disease Type: Asymptomatic Line of Therapy: First Line Intent of Therapy: Non-Curative / Palliative Intent, Discussed with Patient

## 2020-12-24 NOTE — Progress Notes (Signed)
Hematology and Oncology Follow Up Visit  George West [id number] [birth year] 60 y.o. 12/24/2020 2:12 PM George West, [name], MD   Principle Diagnosis: 60 year old with stage IV melanoma of unknown primary presented with posterior shoulder mass with documented metastatic disease with some lymphadenopathy including right axillary, cervical and subpectoral.  His tumor is BRAF mutated with low expression of PD-L1.   Prior Therapy:  He is status post resection of a malignant mass measuring 8 x 5 x 4 cm by Dr. Barry Dienes on December 15, 2020.  The final pathology showed melanoma.  Current therapy: He is under evaluation to start systemic therapy.  Interim History: [name] returns today for a follow-up visit.  Since the last visit, he underwent surgical resection of his shoulder tumor by Dr. Barry Dienes.  Since his surgery, he reports feeling well without any major complications.  He has resumed work related duties at this time.  Denies any fevers chills or sweats.  He denies any weight loss or appetite changes.     Medications: I have reviewed the patient's current medications.  Current Outpatient Medications  Medication Sig Dispense Refill   albuterol (VENTOLIN HFA) 108 (90 Base) MCG/ACT inhaler Inhale 2 puffs into the lungs every 4 (four) hours as needed for wheezing or shortness of breath. 1 each 6   buPROPion (WELLBUTRIN) 100 MG tablet TAKE 1 TABLET BY MOUTH 2 TIMES DAILY. 60 tablet 11   Fluticasone-Umeclidin-Vilant (TRELEGY ELLIPTA) 100-62.5-25 MCG/INH AEPB Inhale 1 puff into the lungs daily. 60 each 6   methocarbamol (ROBAXIN) 500 MG tablet Take 1 tablet (500 mg total) by mouth 4 (four) times daily. 30 tablet 1   naproxen sodium (ALEVE) 220 MG tablet Take 220 mg by mouth daily as needed (pain).     oxyCODONE (OXY IR/ROXICODONE) 5 MG immediate release tablet Take 1-2 tablets (5-10 mg total) by mouth every 6 (six) hours as needed for severe pain. 30 tablet 0   No current  facility-administered medications for this visit.     Allergies:  Allergies  Allergen Reactions   Duloxetine     dizziness     Physical Exam: Blood pressure 117/81, pulse (!) 59, temperature (!) 97.5 F (36.4 C), temperature source Tympanic, resp. rate 18, weight 177 lb 3 oz (80.4 kg), SpO2 100 %.  ECOG: 0 General appearance: alert and cooperative appeared without distress. Head: Normocephalic, without obvious abnormality Oropharynx: No oral thrush or ulcers. Eyes: No scleral icterus.  Pupils are equal and round reactive to light. Lymph nodes: Cervical, supraclavicular, and axillary nodes normal. Heart:regular rate and rhythm, S1, S2 normal, no murmur, click, rub or gallop Lung:chest clear, no wheezing, rales, normal symmetric air entry Abdomin: soft, non-tender, without masses or organomegaly. Neurological: No motor, sensory deficits.  Intact deep tendon reflexes. Skin: Well-healed scar noted on his right shoulder.    Lab Results: Lab Results  Component Value Date   WBC 8.1 12/03/2020   HGB 16.4 12/03/2020   HCT 46.6 12/03/2020   MCV 95.1 12/03/2020   PLT 248 12/03/2020     Chemistry      Component Value Date/Time   NA 137 12/03/2020 0853   NA 141 08/25/2017 0836   K 4.6 12/03/2020 0853   CL 105 12/03/2020 0853   CO2 24 12/03/2020 0853   BUN 8 12/03/2020 0853   BUN 9 08/25/2017 0836   CREATININE 0.71 12/03/2020 0853      Component Value Date/Time   CALCIUM 9.6 12/03/2020 0853   ALKPHOS 123  12/03/2020 0853   AST 19 12/03/2020 0853   ALT 24 12/03/2020 0853   BILITOT 0.4 12/03/2020 0853       Radiological Studies:  IMPRESSION: No pathologic intracranial enhancement to suggest intracranial metastatic disease.   Punctate focus of apparent restricted diffusion within the ventromedial left thalamus, which may reflect a tiny incidental acute infarct or artifact.   Mild multifocal T2/FLAIR hyperintensity within the cerebral white matter and pons,  nonspecific but most often secondary to chronic small vessel ischemia.   Mild generalized parenchymal atrophy.    Impression and Plan:   60 year old man with:  1.  Stage IV melanoma of unknown primary presenting with shoulder mass and hypermetabolic involvement of lymphadenopathy including axillary, subpectoral and cervical lymph nodes.  He is status post surgical resection of his shoulder tumor that was found to have BRAF mutation and PD-L1 expression of 1%.   The natural course of this disease was reviewed today and treatment choices were discussed at this time.  Given is possible involvement of the lymph nodes beyond his resected shoulder mass systemic therapy is warranted.  These options include BRAF targeted therapy, single agent immunotherapy or combination ipilimumab and nivolumab.  Risks and benefits of all these approaches were discussed at this time.  Potential complications were also reviewed.  After discussion today, I have recommended proceeding with ipilimumab and nivolumab combination every 3 weeks for 4 cycles and nivolumab maintenance after that.  Complications that include immune mediated issues, infusion related toxicities among others were discussed.   Switching to a single agent immunotherapy for maintenance purposes after these 4 cycles would be recommended.  Also switching to a single agent nivolumab or Pembrolizumab if he has toxicity associated with combination immunotherapy.  After discussion today he is agreeable to proceed.   2.  Immune mediated issues: Complications such as pneumonitis, colitis, thyroid disease were discussed.  Management choices including steroids and drug discontinuation were discussed.  3.  IV access: Peripheral veins will be used for the time being.  4.  Antiemetics: Prescription for Compazine will be available to him.  5.  CNS metastasis screening: MRI obtained on August 5 showed no evidence of disease.  6.  Follow-up: We will be in  the near future for the start of therapy.   40  minutes were dedicated to this visit. The time was spent on reviewing laboratory data, imaging studies, discussing treatment options, and answering questions regarding future plan.    Zola Button, MD 8/10/20222:12 PM

## 2020-12-25 ENCOUNTER — Inpatient Hospital Stay: Payer: BC Managed Care – PPO | Admitting: Hematology

## 2020-12-29 ENCOUNTER — Ambulatory Visit: Payer: BC Managed Care – PPO | Admitting: Internal Medicine

## 2020-12-30 ENCOUNTER — Telehealth: Payer: Self-pay | Admitting: Hematology

## 2020-12-30 NOTE — Telephone Encounter (Signed)
Scheduled per 08/10 los, spoke with patient's spouse. Patient will be notified of upcoming appointments.

## 2021-01-02 ENCOUNTER — Inpatient Hospital Stay: Payer: BC Managed Care – PPO

## 2021-01-02 ENCOUNTER — Other Ambulatory Visit: Payer: Self-pay | Admitting: Oncology

## 2021-01-02 ENCOUNTER — Other Ambulatory Visit: Payer: Self-pay

## 2021-01-12 ENCOUNTER — Encounter: Payer: Self-pay | Admitting: Hematology

## 2021-01-12 NOTE — Progress Notes (Signed)
Called pt to introduce myself as his Financial Resource Specialist, discuss copay assistance and the [employer] grant.  I left a msg requesting he return my call if he's interested in applying for the grants. 

## 2021-01-13 ENCOUNTER — Inpatient Hospital Stay: Payer: BC Managed Care – PPO

## 2021-01-13 ENCOUNTER — Other Ambulatory Visit: Payer: Self-pay

## 2021-01-13 VITALS — BP 139/92 | HR 58 | Temp 97.8°F | Resp 18 | Wt 174.0 lb

## 2021-01-13 DIAGNOSIS — Z79899 Other long term (current) drug therapy: Secondary | ICD-10-CM | POA: Diagnosis not present

## 2021-01-13 DIAGNOSIS — C4359 Malignant melanoma of other part of trunk: Secondary | ICD-10-CM

## 2021-01-13 DIAGNOSIS — Z5112 Encounter for antineoplastic immunotherapy: Secondary | ICD-10-CM | POA: Diagnosis not present

## 2021-01-13 DIAGNOSIS — C4360 Malignant melanoma of unspecified upper limb, including shoulder: Secondary | ICD-10-CM | POA: Diagnosis not present

## 2021-01-13 LAB — CMP (CANCER CENTER ONLY)
ALT: 25 U/L (ref 0–44)
AST: 21 U/L (ref 15–41)
Albumin: 3.7 g/dL (ref 3.5–5.0)
Alkaline Phosphatase: 133 U/L — ABNORMAL HIGH (ref 38–126)
Anion gap: 9 (ref 5–15)
BUN: 7 mg/dL — ABNORMAL LOW (ref 8–23)
CO2: 22 mmol/L (ref 22–32)
Calcium: 9.2 mg/dL (ref 8.9–10.3)
Chloride: 109 mmol/L (ref 98–111)
Creatinine: 1.1 mg/dL (ref 0.61–1.24)
GFR, Estimated: >60 mL/min (ref >60)
Glucose, Bld: 97 mg/dL (ref 70–99)
Potassium: 4.4 mmol/L (ref 3.5–5.1)
Sodium: 140 mmol/L (ref 135–145)
Total Bilirubin: 0.5 mg/dL (ref 0.3–1.2)
Total Protein: 7.3 g/dL (ref 6.5–8.1)

## 2021-01-13 LAB — CBC WITH DIFFERENTIAL (CANCER CENTER ONLY)
Abs Immature Granulocytes: 0.02 10*3/uL (ref 0.00–0.07)
Basophils Absolute: 0 10*3/uL (ref 0.0–0.1)
Basophils Relative: 1 %
Eosinophils Absolute: 0.5 10*3/uL (ref 0.0–0.5)
Eosinophils Relative: 7 %
HCT: 43 % (ref 39.0–52.0)
Hemoglobin: 14.7 g/dL (ref 13.0–17.0)
Immature Granulocytes: 0 %
Lymphocytes Relative: 32 %
Lymphs Abs: 2.2 10*3/uL (ref 0.7–4.0)
MCH: 32.3 pg (ref 26.0–34.0)
MCHC: 34.2 g/dL (ref 30.0–36.0)
MCV: 94.5 fL (ref 80.0–100.0)
Monocytes Absolute: 0.7 10*3/uL (ref 0.1–1.0)
Monocytes Relative: 9 %
Neutro Abs: 3.7 10*3/uL (ref 1.7–7.7)
Neutrophils Relative %: 51 %
Platelet Count: 239 10*3/uL (ref 150–400)
RBC: 4.55 MIL/uL (ref 4.22–5.81)
RDW: 12 % (ref 11.5–15.5)
WBC Count: 7.1 10*3/uL (ref 4.0–10.5)
nRBC: 0 % (ref 0.0–0.2)

## 2021-01-13 LAB — TSH: TSH: 1.683 u[IU]/mL (ref 0.320–4.118)

## 2021-01-13 MED ORDER — DIPHENHYDRAMINE HCL 50 MG/ML IJ SOLN
25.0000 mg | Freq: Once | INTRAMUSCULAR | Status: AC
  Administered 2021-01-13: 25 mg via INTRAVENOUS
  Filled 2021-01-13: qty 1

## 2021-01-13 MED ORDER — SODIUM CHLORIDE 0.9 % IV SOLN: Freq: Once | INTRAVENOUS | Status: AC

## 2021-01-13 MED ORDER — SODIUM CHLORIDE 0.9 % IV SOLN
240.0000 mg | Freq: Once | INTRAVENOUS | Status: AC
  Administered 2021-01-13: 240 mg via INTRAVENOUS
  Filled 2021-01-13: qty 24

## 2021-01-13 MED ORDER — SODIUM CHLORIDE 0.9 % IV SOLN
1.0000 mg/kg | Freq: Once | INTRAVENOUS | Status: AC
  Administered 2021-01-13: 80 mg via INTRAVENOUS
  Filled 2021-01-13: qty 16

## 2021-01-13 MED ORDER — FAMOTIDINE 20 MG IN NS 100 ML IVPB
20.0000 mg | Freq: Once | INTRAVENOUS | Status: AC
  Administered 2021-01-13: 20 mg via INTRAVENOUS

## 2021-01-13 MED ORDER — SODIUM CHLORIDE 0.9 % IV SOLN
3.0000 mg/kg | Freq: Once | INTRAVENOUS | Status: DC
  Filled 2021-01-13: qty 24.1

## 2021-01-13 NOTE — Progress Notes (Signed)
Ipilimumab (YERVOY) Patient Monitoring Assessment   Is the patient experiencing any of the following general symptoms?:  '[x]'$ Difficulty performing normal activities '[]'$ Feeling sluggish or cold all the time '[]'$ Unusual weight gain '[]'$ Constant or unusual headaches '[]'$ Feeling dizzy or faint '[]'$ Changes in eyesight (blurry vision, double vision, or other vision problems) '[]'$ Changes in mood or behavior (ex: decreased sex drive, irritability, or forgetfulness) '[]'$ Starting new medications (ex: steroids, other medications that lower immune response) '[]'$ Patient is not experiencing any of the general symptoms above.    Gastrointestinal  Patient is having 2 bowel movements each day.  Is this different from baseline? '[]'$ Yes '[x]'$ No Are your stools watery or do they have a foul smell? '[]'$ Yes '[x]'$ No Have you seen blood in your stools? '[]'$ Yes '[x]'$ No Are your stools dark, tarry, or sticky? '[]'$ Yes '[x]'$ No Are you having pain or tenderness in your belly? '[]'$ Yes '[x]'$ No  Skin Does your skin itch? '[]'$ Yes '[x]'$ No Do you have a rash? '[]'$ Yes '[x]'$ No Has your skin blistered and/or peeled? '[]'$ Yes '[x]'$ No Do you have sores in your mouth? '[]'$ Yes '[x]'$ No  Hepatic Has your urine been dark or tea colored? '[]'$ Yes '[x]'$ No Have you noticed that your skin or the whites of your eyes are turning yellow? '[]'$ Yes '[x]'$ No Are you bleeding or bruising more easily than normal? '[]'$ Yes '[x]'$ No Are you nauseous and/or vomiting? '[]'$ Yes '[x]'$ No Do you have pain on the right side of your stomach? '[]'$ Yes '[x]'$ No  Neurologic  Are you having unusual weakness of legs, arms, or face? '[]'$ Yes '[x]'$ No Are you having numbness or tingling in your hands or feet? '[x]'$ Yes '[]'$ No  Isabel Caprice

## 2021-01-13 NOTE — Patient Instructions (Signed)
[facility] ONCOLOGY  Discharge Instructions: Thank you for choosing [facility] to provide your oncology and hematology care.   If you have a lab appointment with the [facility], please go directly to the [facility] and check in at the registration area.   Wear comfortable clothing and clothing appropriate for easy access to any Portacath or PICC line.   We strive to give you quality time with your provider. You may need to reschedule your appointment if you arrive late (15 or more minutes).  Arriving late affects you and other patients whose appointments are after yours.  Also, if you miss three or more appointments without notifying the office, you may be dismissed from the clinic at the provider's discretion.      For prescription refill requests, have your pharmacy contact our office and allow 72 hours for refills to be completed.    Today you received the following chemotherapy and/or immunotherapy agents: Opdivo & Yervoy      To help prevent nausea and vomiting after your treatment, we encourage you to take your nausea medication as directed.  BELOW ARE SYMPTOMS THAT SHOULD BE REPORTED IMMEDIATELY: *FEVER GREATER THAN 100.4 F (38 C) OR HIGHER *CHILLS OR SWEATING *NAUSEA AND VOMITING THAT IS NOT CONTROLLED WITH YOUR NAUSEA MEDICATION *UNUSUAL SHORTNESS OF BREATH *UNUSUAL BRUISING OR BLEEDING *URINARY PROBLEMS (pain or burning when urinating, or frequent urination) *BOWEL PROBLEMS (unusual diarrhea, constipation, pain near the anus) TENDERNESS IN MOUTH AND THROAT WITH OR WITHOUT PRESENCE OF ULCERS (sore throat, sores in mouth, or a toothache) UNUSUAL RASH, SWELLING OR PAIN  UNUSUAL VAGINAL DISCHARGE OR ITCHING   Items with * indicate a potential emergency and should be followed up as soon as possible or go to the Emergency Department if any problems should occur.  Please show the CHEMOTHERAPY ALERT CARD or IMMUNOTHERAPY ALERT CARD at  check-in to the Emergency Department and triage nurse.  Should you have questions after your visit or need to cancel or reschedule your appointment, please contact [facility]  Dept: [phone]  and follow the prompts.  Office hours are 8:00 a.m. to 4:30 p.m. Monday - Friday. Please note that voicemails left after 4:00 p.m. may not be returned until the following business day.  We are closed weekends and major holidays. You have access to a nurse at all times for urgent questions. Please call the main number to the clinic Dept: [phone] and follow the prompts.   For any non-urgent questions, you may also contact your provider using MyChart. We now offer e-Visits for anyone 71 and older to request care online for non-urgent symptoms. For details visit [email or web address].   Also download the MyChart app! Go to the app store, search "MyChart", open the app, select [facility], and log in with your MyChart username and password.  Due to Covid, a mask is required upon entering the hospital/clinic. If you do not have a mask, one will be given to you upon arrival. For doctor visits, patients may have 1 support person aged 48 or older with them. For treatment visits, patients cannot have anyone with them due to current Covid guidelines and our immunocompromised population.   Nivolumab injection What is this medication? NIVOLUMAB (nye VOL ue mab) is a monoclonal antibody. It treats certain types of cancer. Some of the cancers treated are colon cancer, head and neck cancer,Hodgkin lymphoma, lung cancer, and melanoma. This medicine may be used for other purposes; ask  your health care provider orpharmacist if you have questions. COMMON BRAND NAME(S): Opdivo What should I tell my care team before I take this medication? They need to know if you have any of these conditions: autoimmune diseases like Crohn's disease, ulcerative colitis, or lupus have had or planning to  have an allogeneic stem cell transplant (uses someone else's stem cells) history of chest radiation history of organ transplant nervous system problems like myasthenia gravis or Guillain-Barre syndrome an unusual or allergic reaction to nivolumab, other medicines, foods, dyes, or preservatives pregnant or trying to get pregnant breast-feeding How should I use this medication? This medicine is for infusion into a vein. It is given by a health careprofessional in a hospital or clinic setting. A special MedGuide will be given to you before each treatment. Be sure to readthis information carefully each time. Talk to your pediatrician regarding the use of this medicine in children. While this drug may be prescribed for children as young as 12 years for selectedconditions, precautions do apply. Overdosage: If you think you have taken too much of this medicine contact apoison control center or emergency room at once. NOTE: This medicine is only for you. Do not share this medicine with others. What if I miss a dose? It is important not to miss your dose. Call your doctor or health careprofessional if you are unable to keep an appointment. What may interact with this medication? Interactions have not been studied. This list may not describe all possible interactions. Give your health care provider a list of all the medicines, herbs, non-prescription drugs, or dietary supplements you use. Also tell them if you smoke, drink alcohol, or use illegaldrugs. Some items may interact with your medicine. What should I watch for while using this medication? This drug may make you feel generally unwell. Continue your course of treatmenteven though you feel ill unless your doctor tells you to stop. You may need blood work done while you are taking this medicine. Do not become pregnant while taking this medicine or for 5 months after stopping it. Women should inform their doctor if they wish to become pregnant or  think they might be pregnant. There is a potential for serious side effects to an unborn child. Talk to your health care professional or pharmacist for more information. Do not breast-feed an infant while taking this medicine orfor 5 months after stopping it. What side effects may I notice from receiving this medication? Side effects that you should report to your doctor or health care professionalas soon as possible: allergic reactions like skin rash, itching or hives, swelling of the face, lips, or tongue breathing problems blood in the urine bloody or watery diarrhea or black, tarry stools changes in emotions or moods changes in vision chest pain cough dizziness feeling faint or lightheaded, falls fever, chills headache with fever, neck stiffness, confusion, loss of memory, sensitivity to light, hallucination, loss of contact with reality, or seizures joint pain mouth sores redness, blistering, peeling or loosening of the skin, including inside the mouth severe muscle pain or weakness signs and symptoms of high blood sugar such as dizziness; dry mouth; dry skin; fruity breath; nausea; stomach pain; increased hunger or thirst; increased urination signs and symptoms of kidney injury like trouble passing urine or change in the amount of urine signs and symptoms of liver injury like dark yellow or brown urine; general ill feeling or flu-like symptoms; light-colored stools; loss of appetite; nausea; right upper belly pain; unusually weak or tired; yellowing  of the eyes or skin swelling of the ankles, feet, hands trouble passing urine or change in the amount of urine unusually weak or tired weight gain or loss Side effects that usually do not require medical attention (report to yourdoctor or health care professional if they continue or are bothersome): bone pain constipation decreased appetite diarrhea muscle pain nausea, vomiting tiredness This list may not describe all possible side  effects. Call your doctor for medical advice about side effects. You may report side effects to FDA at1-800-FDA-1088. Where should I keep my medication? This drug is given in a hospital or clinic and will not be stored at home. NOTE: This sheet is a summary. It may not cover all possible information. If you have questions about this medicine, talk to your doctor, pharmacist, orhealth care provider.  2022 Elsevier/Gold Standard (2019-09-05 10:08:25)  Ipilimumab injection What is this medication? IPILIMUMAB (IP i LIM ue mab) is a monoclonal antibody. It is used to treat colorectal cancer, kidney cancer, liver cancer, lung cancer, melanoma, andmesothelioma. This medicine may be used for other purposes; ask your health care provider orpharmacist if you have questions. COMMON BRAND NAME(S): YERVOY What should I tell my care team before I take this medication? They need to know if you have any of these conditions: autoimmune diseases like Crohn's disease, ulcerative colitis, or lupus have had or planning to have an allogeneic stem cell transplant (uses someone else's stem cells) history of organ transplant nervous system problems like myasthenia gravis or Guillain-Barre syndrome an unusual or allergic reaction to ipilimumab, other medicines, foods, dyes, or preservatives pregnant or trying to get pregnant breast-feeding How should I use this medication? This medicine is for infusion into a vein. It is given by a health careprofessional in a hospital or clinic setting. A special MedGuide will be given to you before each treatment. Be sure to readthis information carefully each time. Talk to your pediatrician regarding the use of this medicine in children. While this drug may be prescribed for children as young as 12 years for selectedconditions, precautions do apply. Overdosage: If you think you have taken too much of this medicine contact apoison control center or emergency room at once. NOTE:  This medicine is only for you. Do not share this medicine with others. What if I miss a dose? It is important not to miss your dose. Call your doctor or health careprofessional if you are unable to keep an appointment. What may interact with this medication? Interactions are not expected. This list may not describe all possible interactions. Give your health care provider a list of all the medicines, herbs, non-prescription drugs, or dietary supplements you use. Also tell them if you smoke, drink alcohol, or use illegaldrugs. Some items may interact with your medicine. What should I watch for while using this medication? Tell your doctor or healthcare professional if your symptoms do not start toget better or if they get worse. Do not become pregnant while taking this medicine or for 3 months after stopping it. Women should inform their doctor if they wish to become pregnant or think they might be pregnant. There is a potential for serious side effects to an unborn child. Talk to your health care professional or pharmacist for more information. Do not breast-feed an infant while taking this medicine orfor 3 months after the last dose. Your condition will be monitored carefully while you are receiving thismedicine. You may need blood work done while you are taking this medicine. What side effects  may I notice from receiving this medication? Side effects that you should report to your doctor or health care professionalas soon as possible: allergic reactions like skin rash, itching or hives, swelling of the face, lips, or tongue black, tarry stools bloody or watery diarrhea changes in vision dizziness eye pain fast, irregular heartbeat feeling anxious feeling faint or lightheaded, falls nausea, vomiting pain, tingling, numbness in the hands or feet redness, blistering, peeling or loosening of the skin, including inside the mouth signs and symptoms of liver injury like dark yellow or brown  urine; general ill feeling or flu-like symptoms; light-colored stools; loss of appetite; nausea; right upper belly pain; unusually weak or tired; yellowing of the eyes or skin unusual bleeding or bruising Side effects that usually do not require medical attention (report to yourdoctor or health care professional if they continue or are bothersome): headache loss of appetite trouble sleeping This list may not describe all possible side effects. Call your doctor for medical advice about side effects. You may report side effects to FDA at1-800-FDA-1088. Where should I keep my medication? This drug is given in a hospital or clinic and will not be stored at home. NOTE: This sheet is a summary. It may not cover all possible information. If you have questions about this medicine, talk to your doctor, pharmacist, orhealth care provider.  2022 Elsevier/Gold Standard (2019-04-04 18:53:00)

## 2021-01-29 ENCOUNTER — Ambulatory Visit: Payer: BC Managed Care – PPO | Admitting: Hematology

## 2021-01-29 ENCOUNTER — Ambulatory Visit: Payer: BC Managed Care – PPO

## 2021-01-29 ENCOUNTER — Other Ambulatory Visit: Payer: BC Managed Care – PPO

## 2021-02-03 ENCOUNTER — Telehealth: Payer: Self-pay | Admitting: Hematology

## 2021-02-03 ENCOUNTER — Inpatient Hospital Stay: Payer: BC Managed Care – PPO

## 2021-02-03 ENCOUNTER — Inpatient Hospital Stay: Payer: BC Managed Care – PPO | Attending: Hematology

## 2021-02-03 ENCOUNTER — Other Ambulatory Visit: Payer: Self-pay

## 2021-02-03 ENCOUNTER — Inpatient Hospital Stay (HOSPITAL_BASED_OUTPATIENT_CLINIC_OR_DEPARTMENT_OTHER): Payer: BC Managed Care – PPO | Admitting: Hematology

## 2021-02-03 VITALS — BP 128/77 | HR 68 | Temp 98.0°F | Resp 18 | Ht 71.0 in | Wt 181.6 lb

## 2021-02-03 DIAGNOSIS — C4359 Malignant melanoma of other part of trunk: Secondary | ICD-10-CM

## 2021-02-03 DIAGNOSIS — Z5112 Encounter for antineoplastic immunotherapy: Secondary | ICD-10-CM | POA: Diagnosis not present

## 2021-02-03 DIAGNOSIS — Z79899 Other long term (current) drug therapy: Secondary | ICD-10-CM | POA: Diagnosis not present

## 2021-02-03 DIAGNOSIS — C4360 Malignant melanoma of unspecified upper limb, including shoulder: Secondary | ICD-10-CM | POA: Insufficient documentation

## 2021-02-03 DIAGNOSIS — D472 Monoclonal gammopathy: Secondary | ICD-10-CM | POA: Diagnosis not present

## 2021-02-03 LAB — CBC WITH DIFFERENTIAL (CANCER CENTER ONLY)
Abs Immature Granulocytes: 0.01 10*3/uL (ref 0.00–0.07)
Basophils Absolute: 0.1 10*3/uL (ref 0.0–0.1)
Basophils Relative: 1 %
Eosinophils Absolute: 0.3 10*3/uL (ref 0.0–0.5)
Eosinophils Relative: 5 %
HCT: 43.9 % (ref 39.0–52.0)
Hemoglobin: 15 g/dL (ref 13.0–17.0)
Immature Granulocytes: 0 %
Lymphocytes Relative: 28 %
Lymphs Abs: 1.7 10*3/uL (ref 0.7–4.0)
MCH: 32.5 pg (ref 26.0–34.0)
MCHC: 34.2 g/dL (ref 30.0–36.0)
MCV: 95.2 fL (ref 80.0–100.0)
Monocytes Absolute: 0.7 10*3/uL (ref 0.1–1.0)
Monocytes Relative: 11 %
Neutro Abs: 3.4 10*3/uL (ref 1.7–7.7)
Neutrophils Relative %: 55 %
Platelet Count: 224 10*3/uL (ref 150–400)
RBC: 4.61 MIL/uL (ref 4.22–5.81)
RDW: 13 % (ref 11.5–15.5)
WBC Count: 6.1 10*3/uL (ref 4.0–10.5)
nRBC: 0 % (ref 0.0–0.2)

## 2021-02-03 LAB — CMP (CANCER CENTER ONLY)
ALT: 26 U/L (ref 0–44)
AST: 22 U/L (ref 15–41)
Albumin: 3.8 g/dL (ref 3.5–5.0)
Alkaline Phosphatase: 127 U/L — ABNORMAL HIGH (ref 38–126)
Anion gap: 9 (ref 5–15)
BUN: 11 mg/dL (ref 8–23)
CO2: 22 mmol/L (ref 22–32)
Calcium: 9.2 mg/dL (ref 8.9–10.3)
Chloride: 109 mmol/L (ref 98–111)
Creatinine: 1.04 mg/dL (ref 0.61–1.24)
GFR, Estimated: >60 mL/min (ref >60)
Glucose, Bld: 113 mg/dL — ABNORMAL HIGH (ref 70–99)
Potassium: 4.2 mmol/L (ref 3.5–5.1)
Sodium: 140 mmol/L (ref 135–145)
Total Bilirubin: 0.6 mg/dL (ref 0.3–1.2)
Total Protein: 7.5 g/dL (ref 6.5–8.1)

## 2021-02-03 LAB — TSH: TSH: 1.355 u[IU]/mL (ref 0.320–4.118)

## 2021-02-03 MED ORDER — IPILIMUMAB CHEMO INJECTION 200 MG/40ML
1.0000 mg/kg | Freq: Once | INTRAVENOUS | Status: AC
  Administered 2021-02-03: 80 mg via INTRAVENOUS
  Filled 2021-02-03: qty 16

## 2021-02-03 MED ORDER — FAMOTIDINE 20 MG IN NS 100 ML IVPB
20.0000 mg | Freq: Once | INTRAVENOUS | Status: AC
  Administered 2021-02-03: 20 mg via INTRAVENOUS
  Filled 2021-02-03: qty 100

## 2021-02-03 MED ORDER — SODIUM CHLORIDE 0.9 % IV SOLN
240.0000 mg | Freq: Once | INTRAVENOUS | Status: AC
  Administered 2021-02-03: 240 mg via INTRAVENOUS
  Filled 2021-02-03: qty 24

## 2021-02-03 MED ORDER — DIPHENHYDRAMINE HCL 50 MG/ML IJ SOLN
25.0000 mg | Freq: Once | INTRAMUSCULAR | Status: AC
  Administered 2021-02-03: 25 mg via INTRAVENOUS
  Filled 2021-02-03: qty 1

## 2021-02-03 MED ORDER — SODIUM CHLORIDE 0.9 % IV SOLN
Freq: Once | INTRAVENOUS | Status: AC
Start: 2021-02-03 — End: 2021-02-03

## 2021-02-03 NOTE — Progress Notes (Signed)

## 2021-02-03 NOTE — Telephone Encounter (Signed)
Scheduled follow-up appointments per 9/20 los. Patient's wife is aware.

## 2021-02-09 ENCOUNTER — Encounter: Payer: Self-pay | Admitting: Oncology

## 2021-02-09 NOTE — Progress Notes (Signed)
HEMATOLOGY/ONCOLOGY CLINIC NOTE  Date of Service: .02/03/2021  Patient Care Team: [name], MD as PCP - General (Internal Medicine)  CHIEF COMPLAINTS/PURPOSE OF CONSULTATION:  Mx of metastatic melanoma  HISTORY OF PRESENTING ILLNESS:   George West is a wonderful 60 y.o. male who has been referred to [place] by 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 The pt reports that he is doing well overall. We are joined today by his wife.  The pt reports that he originally presented in October 2021 with SOB that had worsened recently. [name] had concerns of neuropathy, which led to all the labs done and signs of abnormal proteins. The protein is on Thymine, but denies Vitamin B12. 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 This numbness is accompanied by burning sensation, purple in color,intermittent swelling, but the pt denies cramping. The pt denies tingling/numbness in hands, pinched nerves in the back. He does note that he heard a popped in his back around two months ago, which immobilized him for a week. This did not affect his neuropathy. The pt notes he also has recently been experiencing lightheadedness/dizziness, which makes him have to stand still. He denies feelings of spinning around and notes this most often occurs after standing up.   The pt notes that he experienced a vasovagal syncope in October 2021, which lasted around 2-3 minutes and led to an ED visit. The pt's SOB occurs after walking short distances. The Pulmonologist got lung testing and an ECHO performed in November, which showed tight airways and decreased performance. 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 The ECHO showed normal function of heart. The pt notes he is on an inhaler (Combivent) but notes minimal help. The pt reports that he smokes 1/2 pack a day currently, down from 1 pack daily.  The pt notes  that his primary driver for having to stop walking or doing activity is his SOB. The neuropathy is primary cause once sitting down for extended periods of time. The pt denies any problems with falling or imbalance. He denies using a cane/walking stick.  The pt notes that he has had an enlarged lymph node in the neck area one year ago and has remain unchanged since then. He also notes a large bump in his upper back right. The pt notes his brother also has this.  The pt notes that he drinks a case of beer a week currently. Over five years ago, the pt notes that he drank two cases a week and a fifth of hard liquor a week in addition.Th pt notes that he has not drank water since he was a teenager, as he only drinks tea, coffee, beer, and liquor.  02/20/2020 IFE showed an IGM Kappa Monoclonal Protein detected.  02/20/2020 Serum protein electrophoresis showed two abnormal protein bands of 0.5 and 0.4.   On review of systems, pt reports SOB, neuropathy of lower extremities, lightheadedness/dizziness and denies muscle weakness, n/v/d, abdominal pain and any other symptoms.  INTERVAL HISTORY  Patient is here for follow-up and continued management of his metastatic melanoma prior to cycle 2 of ipilimumab/nivolumab treatment.  He notes no acute toxicities after his first cycle of treatment.  No rashes no diarrhea no mouth sores no change in energy levels.  Eating well with no weight loss. No other acute new symptoms. No new lumps or bumps.   MEDICAL HISTORY:  Past Medical  History:  Diagnosis Date   Cancer ([facility])    COPD (chronic obstructive pulmonary disease) (HCC)    Depression    Dyspnea    with 3f of ambulation. 200 feet if he walks slow.   Fatty liver    History of kidney stones    seen on CT Scan    SURGICAL HISTORY: Past Surgical History:  Procedure Laterality Date   MASS EXCISION Right 12/15/2020   Procedure: EXCISION OF MALIGNANT MASS RIGHT POSTERIOR SHOULDER;  Surgeon: BStark Klein  MD;  [facility]  Service: General;  Laterality: Right;   WISDOM TOOTH EXTRACTION      SOCIAL HISTORY: Social History   Socioeconomic History   Marital status: Married    Spouse name: Not on file   Number of children: 2   Years of education: Not on file   Highest education level: Not on file  Occupational History   Occupation: PPublic librarian   Comment: [employer]  Tobacco Use   Smoking status: Every Day    Packs/day: 0.50    Years: 40.00    Pack years: 20.00    Types: Cigarettes    Start date: 05/17/1981   Smokeless tobacco: Never  Vaping Use   Vaping Use: Never used  Substance and Sexual Activity   Alcohol use: Yes    Alcohol/week: 6.0 standard drinks    Types: 4 Cans of beer, 2 Standard drinks or equivalent per week   Drug use: No   Sexual activity: Yes    Partners: Female    Birth control/protection: None  Other Topics Concern   Not on file  Social History Narrative   Married   2 children--- 1 daughter/1 son   6 step children   Social Determinants of Health   Financial Resource Strain: Not on file  Food Insecurity: Not on file  Transportation Needs: Not on file  Physical Activity: Not on file  Stress: Not on file  Social Connections: Not on file  Intimate Partner Violence: Not on file    FAMILY HISTORY: Family History  Problem Relation Age of Onset   Stroke Mother    Dementia Mother    Stroke Maternal Grandmother    Breast cancer Sister    Thyroid cancer Sister    Breast cancer Paternal Grandmother     ALLERGIES:  is allergic to duloxetine.  MEDICATIONS:  Current Outpatient Medications  Medication Sig Dispense Refill   albuterol (VENTOLIN HFA) 108 (90 Base) MCG/ACT inhaler Inhale 2 puffs into the lungs every 4 (four) hours as needed for wheezing or shortness of breath. 1 each 6   buPROPion (WELLBUTRIN) 100 MG tablet TAKE 1 TABLET BY MOUTH 2 TIMES DAILY. 60 tablet 11   Fluticasone-Umeclidin-Vilant (TRELEGY ELLIPTA)  100-62.5-25 MCG/INH AEPB Inhale 1 puff into the lungs daily. 60 each 6   prochlorperazine (COMPAZINE) 10 MG tablet Take 1 tablet (10 mg total) by mouth every 6 (six) hours as needed for nausea or vomiting. 30 tablet 0   methocarbamol (ROBAXIN) 500 MG tablet Take 1 tablet (500 mg total) by mouth 4 (four) times daily. 30 tablet 1   naproxen sodium (ALEVE) 220 MG tablet Take 220 mg by mouth daily as needed (pain).     oxyCODONE (OXY IR/ROXICODONE) 5 MG immediate release tablet Take 1-2 tablets (5-10 mg total) by mouth every 6 (six) hours as needed for severe pain. 30 tablet 0   No current facility-administered medications for this visit.    REVIEW OF SYSTEMS:   .  10 Point review of Systems was done is negative except as noted above.   PHYSICAL EXAMINATION: ECOG PERFORMANCE STATUS: 1 - Symptomatic but completely ambulatory  NAD . GENERAL:alert, in no acute distress and comfortable SKIN: no acute rashes, no significant lesions EYES: conjunctiva are pink and non-injected, sclera anicteric OROPHARYNX: MMM, no exudates, no oropharyngeal erythema or ulceration NECK: supple, no JVD LYMPH:  no palpable lymphadenopathy in the cervical, axillary or inguinal regions LUNGS: clear to auscultation b/l with normal respiratory effort HEART: regular rate & rhythm ABDOMEN:  normoactive bowel sounds , non tender, not distended. Extremity: no pedal edema PSYCH: alert & oriented x 3 with fluent speech NEURO: no focal motor/sensory deficits   LABORATORY DATA:  I have reviewed the data as listed  . CBC Latest Ref Rng & Units 02/03/2021 01/13/2021 12/03/2020  WBC 4.0 - 10.5 K/uL 6.1 7.1 8.1  Hemoglobin 13.0 - 17.0 g/dL 15.0 14.7 16.4  Hematocrit 39.0 - 52.0 % 43.9 43.0 46.6  Platelets 150 - 400 K/uL 224 239 248    . CMP Latest Ref Rng & Units 02/03/2021 01/13/2021 12/03/2020  Glucose 70 - 99 mg/dL 113(H) 97 92  BUN 8 - 23 mg/dL 11 7(L) 8  Creatinine 0.61 - 1.24 mg/dL 1.04 1.10 0.71  Sodium 135 - 145  mmol/L 140 140 137  Potassium 3.5 - 5.1 mmol/L 4.2 4.4 4.6  Chloride 98 - 111 mmol/L 109 109 105  CO2 22 - 32 mmol/L 22 22 24   Calcium 8.9 - 10.3 mg/dL 9.2 9.2 9.6  Total Protein 6.5 - 8.1 g/dL 7.5 7.3 7.9  Total Bilirubin 0.3 - 1.2 mg/dL 0.6 0.5 0.4  Alkaline Phos 38 - 126 U/L 127(H) 133(H) 123  AST 15 - 41 U/L 22 21 19   ALT 0 - 44 U/L 26 25 24    SURGICAL PATHOLOGY  CASE: [id number]  PATIENT: George West  Surgical Pathology Report   Clinical History: right posterior shoulder soft tissue mass (cm)   FINAL MICROSCOPIC DIAGNOSIS:   A. SOFT TISSUE MASS, RIGHT POSTERIOR SHOULDER, NEEDLE CORE BIOPSY:  -  Malignant melanoma  -  See comment   COMMENT:   By immunohistochemistry, the neoplastic cells are positive for S100,  HMB45, Sox 10 and Melan-A (patchy) but negative for CD45, CD138,  cytokeratin 7, cytokeratin 20, cytokeratin AE1/3, desmin and EMA.  The  morphology and immunophenotype are consistent with malignant melanoma.  Dr. Irene Limbo was notified of these results on November 24, 2020.  Dr. Saralyn Pilar  reviewed the case and agrees with the above diagnosis.    RADIOGRAPHIC STUDIES: I have personally reviewed the radiological images as listed and agreed with the findings in the report. No results found.   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  Unclear primary site with the patient presented with a metastatic deposit in the soft tissue/skin over the right posterior shoulder. Patient also has multiple slightly hypermetabolic lymph nodes-right axillary, subpectoral and right cervical.  These could be from metastatic melanoma or a possible indolent lymphoma related to his IgM kappa monoclonal paraproteinemia. -Molecular testing was sent on the pathology sample and is BRAF positive -PD-L1 testing positive for PD-L1 expression at 1% PLAN: -Discussed pt's  labwork today, 02/03/2021.  CBC within normal limits, CMP unremarkable. -Patient with no prohibitive toxicities from cycle 1 of ipilimumab/nivolumab treatment. -No other acute new symptoms.  No signs of melanoma progression at this  time. -Follow-up with dermatology for skin screening every 6 to 12 months -Again discussed smoking cessation. -Patient appropriate to proceed with cycle 2 of ipilimumab/nivolumab treatment.  FOLLOW UP Referral to [facility] dermatology for ongoing screening for cutaneous melanoma -Please schedule next 2 cycles of Ipi/nivo immunotherapy as ordered with labs and MD visits.  All of the patients questions were answered with apparent satisfaction. The patient knows to call the clinic with any problems, questions or concerns.  . The total time spent in the appointment was 31 minutes and more than 50% was on counseling and direct patient cares.   Sullivan Lone MD [place] AAHIVMS [facility] Hematology/Oncology Physician [facility]

## 2021-02-27 ENCOUNTER — Inpatient Hospital Stay (HOSPITAL_BASED_OUTPATIENT_CLINIC_OR_DEPARTMENT_OTHER): Payer: BC Managed Care – PPO | Admitting: Hematology

## 2021-02-27 ENCOUNTER — Inpatient Hospital Stay: Payer: BC Managed Care – PPO

## 2021-02-27 ENCOUNTER — Inpatient Hospital Stay: Payer: BC Managed Care – PPO | Attending: Hematology

## 2021-02-27 ENCOUNTER — Other Ambulatory Visit: Payer: Self-pay

## 2021-02-27 VITALS — BP 131/79 | HR 66 | Temp 97.9°F | Resp 17 | Ht 71.0 in | Wt 180.0 lb

## 2021-02-27 DIAGNOSIS — Z5112 Encounter for antineoplastic immunotherapy: Secondary | ICD-10-CM | POA: Insufficient documentation

## 2021-02-27 DIAGNOSIS — Z79899 Other long term (current) drug therapy: Secondary | ICD-10-CM | POA: Insufficient documentation

## 2021-02-27 DIAGNOSIS — C4359 Malignant melanoma of other part of trunk: Secondary | ICD-10-CM

## 2021-02-27 DIAGNOSIS — C4360 Malignant melanoma of unspecified upper limb, including shoulder: Secondary | ICD-10-CM | POA: Insufficient documentation

## 2021-02-27 LAB — CMP (CANCER CENTER ONLY)
ALT: 26 U/L (ref 0–44)
AST: 31 U/L (ref 15–41)
Albumin: 4 g/dL (ref 3.5–5.0)
Alkaline Phosphatase: 116 U/L (ref 38–126)
Anion gap: 10 (ref 5–15)
BUN: 11 mg/dL (ref 8–23)
CO2: 20 mmol/L — ABNORMAL LOW (ref 22–32)
Calcium: 8.9 mg/dL (ref 8.9–10.3)
Chloride: 104 mmol/L (ref 98–111)
Creatinine: 1.24 mg/dL (ref 0.61–1.24)
GFR, Estimated: >60 mL/min (ref >60)
Glucose, Bld: 146 mg/dL — ABNORMAL HIGH (ref 70–99)
Potassium: 4.1 mmol/L (ref 3.5–5.1)
Sodium: 134 mmol/L — ABNORMAL LOW (ref 135–145)
Total Bilirubin: 0.8 mg/dL (ref 0.3–1.2)
Total Protein: 7.6 g/dL (ref 6.5–8.1)

## 2021-02-27 LAB — TSH: TSH: 0.45 u[IU]/mL (ref 0.320–4.118)

## 2021-02-27 LAB — CBC WITH DIFFERENTIAL (CANCER CENTER ONLY)
Abs Immature Granulocytes: 0.02 10*3/uL (ref 0.00–0.07)
Basophils Absolute: 0.1 10*3/uL (ref 0.0–0.1)
Basophils Relative: 1 %
Eosinophils Absolute: 0.4 10*3/uL (ref 0.0–0.5)
Eosinophils Relative: 5 %
HCT: 46.9 % (ref 39.0–52.0)
Hemoglobin: 15.7 g/dL (ref 13.0–17.0)
Immature Granulocytes: 0 %
Lymphocytes Relative: 26 %
Lymphs Abs: 2.1 10*3/uL (ref 0.7–4.0)
MCH: 32.2 pg (ref 26.0–34.0)
MCHC: 33.5 g/dL (ref 30.0–36.0)
MCV: 96.3 fL (ref 80.0–100.0)
Monocytes Absolute: 0.7 10*3/uL (ref 0.1–1.0)
Monocytes Relative: 8 %
Neutro Abs: 4.8 10*3/uL (ref 1.7–7.7)
Neutrophils Relative %: 60 %
Platelet Count: 223 10*3/uL (ref 150–400)
RBC: 4.87 MIL/uL (ref 4.22–5.81)
RDW: 13.2 % (ref 11.5–15.5)
WBC Count: 8 10*3/uL (ref 4.0–10.5)
nRBC: 0 % (ref 0.0–0.2)

## 2021-02-27 MED ORDER — SODIUM CHLORIDE 0.9 % IV SOLN
240.0000 mg | Freq: Once | INTRAVENOUS | Status: AC
  Administered 2021-02-27: 240 mg via INTRAVENOUS
  Filled 2021-02-27: qty 24

## 2021-02-27 MED ORDER — FAMOTIDINE 20 MG IN NS 100 ML IVPB
20.0000 mg | Freq: Once | INTRAVENOUS | Status: AC
  Administered 2021-02-27: 20 mg via INTRAVENOUS
  Filled 2021-02-27: qty 100

## 2021-02-27 MED ORDER — DIPHENHYDRAMINE HCL 50 MG/ML IJ SOLN
25.0000 mg | Freq: Once | INTRAMUSCULAR | Status: AC
  Administered 2021-02-27: 25 mg via INTRAVENOUS
  Filled 2021-02-27: qty 1

## 2021-02-27 MED ORDER — SODIUM CHLORIDE 0.9 % IV SOLN
1.0000 mg/kg | Freq: Once | INTRAVENOUS | Status: AC
  Administered 2021-02-27: 80 mg via INTRAVENOUS
  Filled 2021-02-27: qty 16

## 2021-02-27 MED ORDER — SODIUM CHLORIDE 0.9 % IV SOLN: Freq: Once | INTRAVENOUS | Status: AC

## 2021-02-27 NOTE — Progress Notes (Signed)
Ipilimumab (YERVOY) Patient Monitoring Assessment  ° °Is the patient experiencing any of the following general symptoms?:  °[]Difficulty performing normal activities °[]Feeling sluggish or cold all the time °[]Unusual weight gain °[]Constant or unusual headaches °[]Feeling dizzy or faint °[]Changes in eyesight (blurry vision, double vision, or other vision problems) °[]Changes in mood or behavior (ex: decreased sex drive, irritability, or forgetfulness) °[]Starting new medications (ex: steroids, other medications that lower immune response) °[x]Patient is not experiencing any of the general symptoms above.  ° ° °Gastrointestinal  °Patient is having 2 bowel movements each day.  °Is this different from baseline? []Yes [x]No °Are your stools watery or do they have a foul smell? []Yes [x]No °Have you seen blood in your stools? []Yes [x]No °Are your stools dark, tarry, or sticky? []Yes [x]No °Are you having pain or tenderness in your belly? []Yes [x]No ° °Skin °Does your skin itch? []Yes [x]No °Do you have a rash? []Yes [x]No °Has your skin blistered and/or peeled? []Yes [x]No °Do you have sores in your mouth? []Yes [x]No ° °Hepatic °Has your urine been dark or tea colored? []Yes [x]No °Have you noticed that your skin or the whites of your eyes are turning yellow? []Yes [x]No °Are you bleeding or bruising more easily than normal? []Yes [x]No °Are you nauseous and/or vomiting? []Yes [x]No °Do you have pain on the right side of your stomach? []Yes [x]No ° °Neurologic  °Are you having unusual weakness of legs, arms, or face? []Yes [x]No °Are you having numbness or tingling in your hands or feet? []Yes [x]No ° °George West N  °

## 2021-02-27 NOTE — Patient Instructions (Signed)
[facility]COL[facility]  Discharge Instructions: ?Thank you for choosing [facility] to provide your oncology and hematology care.  ? ?If you have a lab appointment with the Cancer Center, please go directly to the Cancer Center and check in at the registration area. ?  ?Wear comfortable clothing and clothing appropriate for easy access to any Portacath or PICC line.  ? ?We strive to give you quality time with your provider. You may need to reschedule your appointment if you arrive late (15 or more minutes).  Arriving late affects you and other patients whose appointments are after yours.  Also, if you miss three or more appointments without notifying the office, you may be dismissed from the clinic at the provider?s discretion.    ?  ?For prescription refill requests, have your pharmacy contact our office and allow 72 hours for refills to be completed.   ? ?Today you received the following chemotherapy and/or immunotherapy agents Opdivo & Yervoy    ?  ?To help prevent nausea and vomiting after your treatment, we encourage you to take your nausea medication as directed. ? ?BELOW ARE SYMPTOMS THAT SHOULD BE REPORTED IMMEDIATELY: ?*FEVER GREATER THAN 100.4 F (38 ?C) OR HIGHER ?*CHILLS OR SWEATING ?*NAUSEA AND VOMITING THAT IS NOT CONTROLLED WITH YOUR NAUSEA MEDICATION ?*UNUSUAL SHORTNESS OF BREATH ?*UNUSUAL BRUISING OR BLEEDING ?*URINARY PROBLEMS (pain or burning when urinating, or frequent urination) ?*BOWEL PROBLEMS (unusual diarrhea, constipation, pain near the anus) ?TENDERNESS IN MOUTH AND THROAT WITH OR WITHOUT PRESENCE OF ULCERS (sore throat, sores in mouth, or a toothache) ?UNUSUAL RASH, SWELLING OR PAIN  ?UNUSUAL VAGINAL DISCHARGE OR ITCHING  ? ?Items with * indicate a potential emergency and should be followed up as soon as possible or go to the Emergency Department if any problems should occur. ? ?Please show the CHEMOTHERAPY ALERT CARD or IMMUNOTHERAPY ALERT CARD at  check-in to the Emergency Department and triage nurse. ? ?Should you have questions after your visit or need to cancel or reschedule your appointment, please contact [facility]  Dept: [phone]  and follow the prompts.  Office hours are 8:00 a.m. to 4:30 p.m. Monday - Friday. Please note that voicemails left after 4:00 p.m. may not be returned until the following business day.  We are closed weekends and major holidays. You have access to a nurse at all times for urgent questions. Please call the main number to the clinic Dept: [phone] and follow the prompts. ? ? ?For any non-urgent questions, you may also contact your provider using MyChart. We now offer e-Visits for anyone 18 and older to request care online for non-urgent symptoms. For details visit [email or web address]. ?  ?Also download the MyChart app! Go to the app store, search "MyChart", open the app, select [facility], and log in with your MyChart username and password. ? ?Due to Covid, a mask is required upon entering the hospital/clinic. If you do not have a mask, one will be given to you upon arrival. For doctor visits, patients may have 1 support person aged 18 or older with them. For treatment visits, patients cannot have anyone with them due to current Covid guidelines and our immunocompromised population.  ? ?

## 2021-02-27 NOTE — Progress Notes (Signed)
HEMATOLOGY/ONCOLOGY CLINIC NOTE  Date of Service: .02/27/2021  Patient Care Team: [name], MD as PCP - General (Internal Medicine)  CHIEF COMPLAINTS/PURPOSE OF CONSULTATION:  Mx of metastatic melanoma  HISTORY OF PRESENTING ILLNESS:   George West is a wonderful 60 y.o. male who has been referred to [place] by 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 The pt reports that he is doing well overall. We are joined today by his wife.  The pt reports that he originally presented in October 2021 with SOB that had worsened recently. [name] had concerns of neuropathy, which led to all the labs done and signs of abnormal proteins. The protein is on Thymine, but denies Vitamin B12. 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 This numbness is accompanied by burning sensation, purple in color,intermittent swelling, but the pt denies cramping. The pt denies tingling/numbness in hands, pinched nerves in the back. He does note that he heard a popped in his back around two months ago, which immobilized him for a week. This did not affect his neuropathy. The pt notes he also has recently been experiencing lightheadedness/dizziness, which makes him have to stand still. He denies feelings of spinning around and notes this most often occurs after standing up.   The pt notes that he experienced a vasovagal syncope in October 2021, which lasted around 2-3 minutes and led to an ED visit. The pt's SOB occurs after walking short distances. The Pulmonologist got lung testing and an ECHO performed in November, which showed tight airways and decreased performance. 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 The ECHO showed normal function of heart. The pt notes he is on an inhaler (Combivent) but notes minimal help. The pt reports that he smokes 1/2 pack a day currently, down from 1 pack daily.  The pt notes  that his primary driver for having to stop walking or doing activity is his SOB. The neuropathy is primary cause once sitting down for extended periods of time. The pt denies any problems with falling or imbalance. He denies using a cane/walking stick.  The pt notes that he has had an enlarged lymph node in the neck area one year ago and has remain unchanged since then. He also notes a large bump in his upper back right. The pt notes his brother also has this.  The pt notes that he drinks a case of beer a week currently. Over five years ago, the pt notes that he drank two cases a week and a fifth of hard liquor a week in addition.Th pt notes that he has not drank water since he was a teenager, as he only drinks tea, coffee, beer, and liquor.  02/20/2020 IFE showed an IGM Kappa Monoclonal Protein detected.  02/20/2020 Serum protein electrophoresis showed two abnormal protein bands of 0.5 and 0.4.   On review of systems, pt reports SOB, neuropathy of lower extremities, lightheadedness/dizziness and denies muscle weakness, n/v/d, abdominal pain and any other symptoms.  INTERVAL HISTORY  Patient is here for follow-up and continued management of his metastatic melanoma prior to cycle 3 of ipilimumab/nivolumab treatment.  His last visit with [place] was on 02/03/2021.  He notes no acute toxicities after his first 2 cycles of treatment.  No rashes no diarrhea no mouth sores no change in energy levels.  Eating well with no weight loss. Notes that he has his regular fall  and allergies with some sneezing and runny nose.  He notes that he has this every fall when the weather changes.  No fevers no chills no night sweats no loss of taste or smell.  No sore throat.  No mouth sores. No other acute new symptoms. No new lumps or bumps.   MEDICAL HISTORY:  Past Medical History:  Diagnosis Date   Cancer (HCC)    COPD (chronic obstructive pulmonary disease) (HCC)    Depression    Dyspnea    with 50ft of ambulation.  200 feet if he walks slow.   Fatty liver    History of kidney stones    seen on CT Scan    SURGICAL HISTORY: Past Surgical History:  Procedure Laterality Date   MASS EXCISION Right 12/15/2020   Procedure: EXCISION OF MALIGNANT MASS RIGHT POSTERIOR SHOULDER;  Surgeon: Byerly, Faera, MD;  Location: [facility] OR;  Service: General;  Laterality: Right;   WISDOM TOOTH EXTRACTION      SOCIAL HISTORY: Social History   Socioeconomic History   Marital status: Married    Spouse name: Not on file   Number of children: 2   Years of education: Not on file   Highest education level: Not on file  Occupational History   Occupation: Parts Representative    Comment: [employer]  Tobacco Use   Smoking status: Every Day    Packs/day: 0.50    Years: 40.00    Pack years: 20.00    Types: Cigarettes    Start date: 05/17/1981   Smokeless tobacco: Never  Vaping Use   Vaping Use: Never used  Substance and Sexual Activity   Alcohol use: Yes    Alcohol/week: 6.0 standard drinks    Types: 4 Cans of beer, 2 Standard drinks or equivalent per week   Drug use: No   Sexual activity: Yes    Partners: Female    Birth control/protection: None  Other Topics Concern   Not on file  Social History Narrative   Married   2 children--- 1 daughter/1 son   6 step children   Social Determinants of Health   Financial Resource Strain: Not on file  Food Insecurity: Not on file  Transportation Needs: Not on file  Physical Activity: Not on file  Stress: Not on file  Social Connections: Not on file  Intimate Partner Violence: Not on file    FAMILY HISTORY: Family History  Problem Relation Age of Onset   Stroke Mother    Dementia Mother    Stroke Maternal Grandmother    Breast cancer Sister    Thyroid cancer Sister    Breast cancer Paternal Grandmother     ALLERGIES:  is allergic to duloxetine.  MEDICATIONS:  Current Outpatient Medications  Medication Sig Dispense Refill   albuterol (VENTOLIN  HFA) 108 (90 Base) MCG/ACT inhaler Inhale 2 puffs into the lungs every 4 (four) hours as needed for wheezing or shortness of breath. 1 each 6   buPROPion (WELLBUTRIN) 100 MG tablet TAKE 1 TABLET BY MOUTH 2 TIMES DAILY. 60 tablet 11   Fluticasone-Umeclidin-Vilant (TRELEGY ELLIPTA) 100-62.5-25 MCG/INH AEPB Inhale 1 puff into the lungs daily. 60 each 6   methocarbamol (ROBAXIN) 500 MG tablet Take 1 tablet (500 mg total) by mouth 4 (four) times daily. 30 tablet 1   naproxen sodium (ALEVE) 220 MG tablet Take 220 mg by mouth daily as needed (pain).     oxyCODONE (OXY IR/ROXICODONE) 5 MG immediate release tablet Take 1-2 tablets (5-10   mg total) by mouth every 6 (six) hours as needed for severe pain. 30 tablet 0   prochlorperazine (COMPAZINE) 10 MG tablet Take 1 tablet (10 mg total) by mouth every 6 (six) hours as needed for nausea or vomiting. (Patient not taking: Reported on 02/27/2021) 30 tablet 0   No current facility-administered medications for this visit.    REVIEW OF SYSTEMS:   .10 Point review of Systems was done is negative except as noted above.    PHYSICAL EXAMINATION: ECOG PERFORMANCE STATUS: 1 - Symptomatic but completely ambulatory  NAD . GENERAL:alert, in no acute distress and comfortable SKIN: no acute rashes, no significant lesions EYES: conjunctiva are pink and non-injected, sclera anicteric OROPHARYNX: MMM, no exudates, no oropharyngeal erythema or ulceration NECK: supple, no JVD LYMPH:  no palpable lymphadenopathy in the cervical, axillary or inguinal regions LUNGS: clear to auscultation b/l with normal respiratory effort HEART: regular rate & rhythm ABDOMEN:  normoactive bowel sounds , non tender, not distended. Extremity: no pedal edema PSYCH: alert & oriented x 3 with fluent speech NEURO: no focal motor/sensory deficits    LABORATORY DATA:  I have reviewed the data as listed  . CBC Latest Ref Rng & Units 02/27/2021 02/03/2021 01/13/2021  WBC 4.0 - 10.5 K/uL 8.0  6.1 7.1  Hemoglobin 13.0 - 17.0 g/dL 15.7 15.0 14.7  Hematocrit 39.0 - 52.0 % 46.9 43.9 43.0  Platelets 150 - 400 K/uL 223 224 239    . CMP Latest Ref Rng & Units 02/03/2021 01/13/2021 12/03/2020  Glucose 70 - 99 mg/dL 113(H) 97 92  BUN 8 - 23 mg/dL 11 7(L) 8  Creatinine 0.61 - 1.24 mg/dL 1.04 1.10 0.71  Sodium 135 - 145 mmol/L 140 140 137  Potassium 3.5 - 5.1 mmol/L 4.2 4.4 4.6  Chloride 98 - 111 mmol/L 109 109 105  CO2 22 - 32 mmol/L _0 Calcium 8.9 - 10.3 mg/dL 9.2 9.2 9.6  Total Protein 6.5 - 8.1 g/dL 7.5 7.3 7.9  Total Bilirubin 0.3 - 1.2 mg/dL 0.6 0.5 0.4  Alkaline Phos 38 - 126 U/L 127(H) 133(H) 123  AST 15 - 41 U/L _1 ALT 0 - 44 U/L _2 SURGICAL PATHOLOGY  CASE: [id number]  PATIENT: Amman Vaile  Surgical Pathology Report   Clinical History: right posterior shoulder soft tissue mass (cm)   FINAL MICROSCOPIC DIAGNOSIS:   A. SOFT TISSUE MASS, RIGHT POSTERIOR SHOULDER, NEEDLE CORE BIOPSY:  -  Malignant melanoma  -  See comment   COMMENT:   By immunohistochemistry, the neoplastic cells are positive for S100,  HMB45, Sox 10 and Melan-A (patchy) but negative for CD45, CD138,  cytokeratin 7, cytokeratin 20, cytokeratin AE1/3, desmin and EMA.  The  morphology and immunophenotype are consistent with malignant melanoma.  Dr. Irene Limbo was notified of these results on November 24, 2020.  Dr. Saralyn Pilar  reviewed the case and agrees with the above diagnosis.    RADIOGRAPHIC STUDIES: I have personally reviewed the radiological images as listed and agreed with the findings in the report. No results found.   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  Unclear primary site with the patient presented with a metastatic deposit in the soft tissue/skin over the right posterior shoulder. Patient also has multiple slightly hypermetabolic lymph  nodes-right axillary, subpectoral and right cervical.  These could be from metastatic melanoma  or a possible indolent lymphoma related to his IgM kappa monoclonal paraproteinemia. -Molecular testing was sent on the pathology sample and is BRAF positive -PD-L1 testing positive for PD-L1 expression at 1% PLAN: -Discussed pt's labwork today, 02/27/2021.  CBC within normal limits, CMP pending -Patient with no prohibitive toxicities from cycle 2 of ipilimumab/nivolumab treatment. -No other acute new symptoms.  No signs of melanoma progression at this time. -Follow-up with dermatology for skin screening every 6 to 12 months -Again discussed smoking cessation. -Patient appropriate to proceed with cycle 3 of ipilimumab/nivolumab treatment.  3) nasal allergies -Patient recommended to use team and over-the-counter antihistamines as needed. -If he has a fever or symptoms persist or he develops throat pain or shortness of breath he should have additional evaluation including COVID and flu testing.  Patient at this time feels like this is his regular allergy.  FOLLOW UP Follow-up as per next scheduled appointments for labs MD visit and fourth cycle of immunotherapy on 03/30/2021  All of the patients questions were answered with apparent satisfaction. The patient knows to call the clinic with any problems, questions or concerns.  . The total time spent in the appointment was 30 minutes and more than 50% was on counseling and direct patient cares, ordering and management of immunotherapy and toxicity assessment.    Gautam Kale MD MS AAHIVMS [facility] Hematology/Oncology Physician [facility]   

## 2021-03-20 ENCOUNTER — Inpatient Hospital Stay: Payer: BC Managed Care – PPO

## 2021-03-20 ENCOUNTER — Other Ambulatory Visit: Payer: Self-pay

## 2021-03-20 ENCOUNTER — Inpatient Hospital Stay (HOSPITAL_BASED_OUTPATIENT_CLINIC_OR_DEPARTMENT_OTHER): Payer: BC Managed Care – PPO | Admitting: Hematology

## 2021-03-20 ENCOUNTER — Inpatient Hospital Stay: Payer: BC Managed Care – PPO | Attending: Hematology

## 2021-03-20 VITALS — BP 121/75 | HR 77 | Temp 99.7°F | Resp 20 | Ht 71.0 in | Wt 180.4 lb

## 2021-03-20 DIAGNOSIS — R7989 Other specified abnormal findings of blood chemistry: Secondary | ICD-10-CM

## 2021-03-20 DIAGNOSIS — C4360 Malignant melanoma of unspecified upper limb, including shoulder: Secondary | ICD-10-CM | POA: Insufficient documentation

## 2021-03-20 DIAGNOSIS — Z5112 Encounter for antineoplastic immunotherapy: Secondary | ICD-10-CM | POA: Diagnosis not present

## 2021-03-20 DIAGNOSIS — Z79899 Other long term (current) drug therapy: Secondary | ICD-10-CM | POA: Insufficient documentation

## 2021-03-20 DIAGNOSIS — C799 Secondary malignant neoplasm of unspecified site: Secondary | ICD-10-CM | POA: Insufficient documentation

## 2021-03-20 DIAGNOSIS — C4359 Malignant melanoma of other part of trunk: Secondary | ICD-10-CM

## 2021-03-20 DIAGNOSIS — Z87891 Personal history of nicotine dependence: Secondary | ICD-10-CM

## 2021-03-20 LAB — CBC WITH DIFFERENTIAL (CANCER CENTER ONLY)
Abs Immature Granulocytes: 0.03 10*3/uL (ref 0.00–0.07)
Basophils Absolute: 0 10*3/uL (ref 0.0–0.1)
Basophils Relative: 1 %
Eosinophils Absolute: 0.3 10*3/uL (ref 0.0–0.5)
Eosinophils Relative: 4 %
HCT: 46.1 % (ref 39.0–52.0)
Hemoglobin: 15.8 g/dL (ref 13.0–17.0)
Immature Granulocytes: 0 %
Lymphocytes Relative: 16 %
Lymphs Abs: 1.3 10*3/uL (ref 0.7–4.0)
MCH: 32.5 pg (ref 26.0–34.0)
MCHC: 34.3 g/dL (ref 30.0–36.0)
MCV: 94.9 fL (ref 80.0–100.0)
Monocytes Absolute: 0.8 10*3/uL (ref 0.1–1.0)
Monocytes Relative: 10 %
Neutro Abs: 6 10*3/uL (ref 1.7–7.7)
Neutrophils Relative %: 69 %
Platelet Count: 221 10*3/uL (ref 150–400)
RBC: 4.86 MIL/uL (ref 4.22–5.81)
RDW: 13.1 % (ref 11.5–15.5)
WBC Count: 8.6 10*3/uL (ref 4.0–10.5)
nRBC: 0 % (ref 0.0–0.2)

## 2021-03-20 LAB — CMP (CANCER CENTER ONLY)
ALT: 25 U/L (ref 0–44)
AST: 19 U/L (ref 15–41)
Albumin: 3.8 g/dL (ref 3.5–5.0)
Alkaline Phosphatase: 133 U/L — ABNORMAL HIGH (ref 38–126)
Anion gap: 8 (ref 5–15)
BUN: 12 mg/dL (ref 8–23)
CO2: 20 mmol/L — ABNORMAL LOW (ref 22–32)
Calcium: 8.9 mg/dL (ref 8.9–10.3)
Chloride: 110 mmol/L (ref 98–111)
Creatinine: 1.01 mg/dL (ref 0.61–1.24)
GFR, Estimated: >60 mL/min (ref >60)
Glucose, Bld: 104 mg/dL — ABNORMAL HIGH (ref 70–99)
Potassium: 4.5 mmol/L (ref 3.5–5.1)
Sodium: 138 mmol/L (ref 135–145)
Total Bilirubin: 0.8 mg/dL (ref 0.3–1.2)
Total Protein: 7.6 g/dL (ref 6.5–8.1)

## 2021-03-20 LAB — TSH: TSH: <0.08 u[IU]/mL — ABNORMAL LOW (ref 0.320–4.118)

## 2021-03-20 MED ORDER — SODIUM CHLORIDE 0.9 % IV SOLN: Freq: Once | INTRAVENOUS | Status: AC

## 2021-03-20 MED ORDER — SODIUM CHLORIDE 0.9 % IV SOLN
1.0000 mg/kg | Freq: Once | INTRAVENOUS | Status: AC
  Administered 2021-03-20: 80 mg via INTRAVENOUS
  Filled 2021-03-20: qty 16

## 2021-03-20 MED ORDER — FAMOTIDINE 20 MG IN NS 100 ML IVPB
20.0000 mg | Freq: Once | INTRAVENOUS | Status: AC
  Administered 2021-03-20: 20 mg via INTRAVENOUS
  Filled 2021-03-20: qty 100

## 2021-03-20 MED ORDER — SODIUM CHLORIDE 0.9 % IV SOLN
240.0000 mg | Freq: Once | INTRAVENOUS | Status: AC
  Administered 2021-03-20: 240 mg via INTRAVENOUS
  Filled 2021-03-20: qty 24

## 2021-03-20 MED ORDER — DIPHENHYDRAMINE HCL 50 MG/ML IJ SOLN
25.0000 mg | Freq: Once | INTRAMUSCULAR | Status: AC
  Administered 2021-03-20: 25 mg via INTRAVENOUS
  Filled 2021-03-20: qty 1

## 2021-03-20 NOTE — Patient Instructions (Signed)
[facility]  Discharge Instructions: ?Thank you for choosing [facility] to provide your oncology and hematology care.  ? ?If you have a lab appointment with the Cancer Center, please go directly to the Cancer Center and check in at the registration area. ?  ?Wear comfortable clothing and clothing appropriate for easy access to any Portacath or PICC line.  ? ?We strive to give you quality time with your provider. You may need to reschedule your appointment if you arrive late (15 or more minutes).  Arriving late affects you and other patients whose appointments are after yours.  Also, if you miss three or more appointments without notifying the office, you may be dismissed from the clinic at the provider?s discretion.    ?  ?For prescription refill requests, have your pharmacy contact our office and allow 72 hours for refills to be completed.   ? ?Today you received the following chemotherapy and/or immunotherapy agents Opdivo & Yervoy    ?  ?To help prevent nausea and vomiting after your treatment, we encourage you to take your nausea medication as directed. ? ?BELOW ARE SYMPTOMS THAT SHOULD BE REPORTED IMMEDIATELY: ?*FEVER GREATER THAN 100.4 F (38 ?C) OR HIGHER ?*CHILLS OR SWEATING ?*NAUSEA AND VOMITING THAT IS NOT CONTROLLED WITH YOUR NAUSEA MEDICATION ?*UNUSUAL SHORTNESS OF BREATH ?*UNUSUAL BRUISING OR BLEEDING ?*URINARY PROBLEMS (pain or burning when urinating, or frequent urination) ?*BOWEL PROBLEMS (unusual diarrhea, constipation, pain near the anus) ?TENDERNESS IN MOUTH AND THROAT WITH OR WITHOUT PRESENCE OF ULCERS (sore throat, sores in mouth, or a toothache) ?UNUSUAL RASH, SWELLING OR PAIN  ?UNUSUAL VAGINAL DISCHARGE OR ITCHING  ? ?Items with * indicate a potential emergency and should be followed up as soon as possible or go to the Emergency Department if any problems should occur. ? ?Please show the CHEMOTHERAPY ALERT CARD or IMMUNOTHERAPY ALERT CARD at  check-in to the Emergency Department and triage nurse. ? ?Should you have questions after your visit or need to cancel or reschedule your appointment, please contact [facility]  Dept: [phone]  and follow the prompts.  Office hours are 8:00 a.m. to 4:30 p.m. Monday - Friday. Please note that voicemails left after 4:00 p.m. may not be returned until the following business day.  We are closed weekends and major holidays. You have access to a nurse at all times for urgent questions. Please call the main number to the clinic Dept: [phone] and follow the prompts. ? ? ?For any non-urgent questions, you may also contact your provider using MyChart. We now offer e-Visits for anyone 18 and older to request care online for non-urgent symptoms. For details visit [email or web address]. ?  ?Also download the MyChart app! Go to the app store, search "MyChart", open the app, select [facility], and log in with your MyChart username and password. ? ?Due to Covid, a mask is required upon entering the hospital/clinic. If you do not have a mask, one will be given to you upon arrival. For doctor visits, patients may have 1 support person aged 18 or older with them. For treatment visits, patients cannot have anyone with them due to current Covid guidelines and our immunocompromised population.  ? ?

## 2021-03-20 NOTE — Progress Notes (Signed)

## 2021-03-26 ENCOUNTER — Encounter: Payer: Self-pay | Admitting: Oncology

## 2021-03-26 ENCOUNTER — Telehealth: Payer: Self-pay | Admitting: Hematology

## 2021-03-26 NOTE — Progress Notes (Addendum)
HEMATOLOGY/ONCOLOGY CLINIC NOTE  Date of Service: .03/20/2021  Patient Care Team: [name], MD as PCP - General (Internal Medicine)  CHIEF COMPLAINTS/PURPOSE OF CONSULTATION:  Mx of metastatic melanoma  HISTORY OF PRESENTING ILLNESS:   George West is a wonderful 60 y.o. male who has been referred to [place] by 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 The pt reports that he is doing well overall. We are joined today by his wife.  The pt reports that he originally presented in October 2021 with SOB that had worsened recently. [name] had concerns of neuropathy, which led to all the labs done and signs of abnormal proteins. The protein is on Thymine, but denies Vitamin B12. 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 This numbness is accompanied by burning sensation, purple in color,intermittent swelling, but the pt denies cramping. The pt denies tingling/numbness in hands, pinched nerves in the back. He does note that he heard a popped in his back around two months ago, which immobilized him for a week. This did not affect his neuropathy. The pt notes he also has recently been experiencing lightheadedness/dizziness, which makes him have to stand still. He denies feelings of spinning around and notes this most often occurs after standing up.   The pt notes that he experienced a vasovagal syncope in October 2021, which lasted around 2-3 minutes and led to an ED visit. The pt's SOB occurs after walking short distances. The Pulmonologist got lung testing and an ECHO performed in November, which showed tight airways and decreased performance. 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 The ECHO showed normal function of heart. The pt notes he is on an inhaler (Combivent) but notes minimal help. The pt reports that he smokes 1/2 pack a day currently, down from 1 pack daily.  The pt notes  that his primary driver for having to stop walking or doing activity is his SOB. The neuropathy is primary cause once sitting down for extended periods of time. The pt denies any problems with falling or imbalance. He denies using a cane/walking stick.  The pt notes that he has had an enlarged lymph node in the neck area one year ago and has remain unchanged since then. He also notes a large bump in his upper back right. The pt notes his brother also has this.  The pt notes that he drinks a case of beer a week currently. Over five years ago, the pt notes that he drank two cases a week and a fifth of hard liquor a week in addition.Th pt notes that he has not drank water since he was a teenager, as he only drinks tea, coffee, beer, and liquor.  02/20/2020 IFE showed an IGM Kappa Monoclonal Protein detected.  02/20/2020 Serum protein electrophoresis showed two abnormal protein bands of 0.5 and 0.4.   On review of systems, pt reports SOB, neuropathy of lower extremities, lightheadedness/dizziness and denies muscle weakness, n/v/d, abdominal pain and any other symptoms.  INTERVAL HISTORY  Patient is here for follow-up and continued management of his metastatic melanoma prior to cycle 4 of ipilimumab/nivolumab treatment.  His last visit with [place] was on 02/27/2021.    He notes no acute toxicities after his first 3 cycles Ipi/Nivo treatment.   No rashes no diarrhea no mouth sores no change in energy levels.  Eating well with no weight loss. No fevers no chills  no night sweats no loss of taste or smell.  No sore throat.  No mouth sores. No other acute new symptoms. No new lumps or bumps. Patient has still been smoking.  Labs today 03/20/2021 CBC within normal limits, CMP unremarkable, TSH pending    MEDICAL HISTORY:  Past Medical History:  Diagnosis Date   Cancer ([place])    COPD (chronic obstructive pulmonary disease) ([facility])    Depression    Dyspnea    with 8f of ambulation. 200 feet if he walks  slow.   Fatty liver    History of kidney stones    seen on CT Scan    SURGICAL HISTORY: Past Surgical History:  Procedure Laterality Date   MASS EXCISION Right 12/15/2020   Procedure: EXCISION OF MALIGNANT MASS RIGHT POSTERIOR SHOULDER;  Surgeon: BStark Klein MD;  [facility]  Service: General;  Laterality: Right;   WISDOM TOOTH EXTRACTION      SOCIAL HISTORY: Social History   Socioeconomic History   Marital status: Married    Spouse name: Not on file   Number of children: 2   Years of education: Not on file   Highest education level: Not on file  Occupational History   Occupation: PPublic librarian   Comment: [employer]  Tobacco Use   Smoking status: Every Day    Packs/day: 0.50    Years: 40.00    Pack years: 20.00    Types: Cigarettes    Start date: 05/17/1981   Smokeless tobacco: Never  Vaping Use   Vaping Use: Never used  Substance and Sexual Activity   Alcohol use: Yes    Alcohol/week: 6.0 standard drinks    Types: 4 Cans of beer, 2 Standard drinks or equivalent per week   Drug use: No   Sexual activity: Yes    Partners: Female    Birth control/protection: None  Other Topics Concern   Not on file  Social History Narrative   Married   2 children--- 1 daughter/1 son   6 step children   Social Determinants of Health   Financial Resource Strain: Not on file  Food Insecurity: Not on file  Transportation Needs: Not on file  Physical Activity: Not on file  Stress: Not on file  Social Connections: Not on file  Intimate Partner Violence: Not on file    FAMILY HISTORY: Family History  Problem Relation Age of Onset   Stroke Mother    Dementia Mother    Stroke Maternal Grandmother    Breast cancer Sister    Thyroid cancer Sister    Breast cancer Paternal Grandmother     ALLERGIES:  is allergic to duloxetine.  MEDICATIONS:  Current Outpatient Medications  Medication Sig Dispense Refill   albuterol (VENTOLIN HFA) 108 (90 Base)  MCG/ACT inhaler Inhale 2 puffs into the lungs every 4 (four) hours as needed for wheezing or shortness of breath. 1 each 6   buPROPion (WELLBUTRIN) 100 MG tablet TAKE 1 TABLET BY MOUTH 2 TIMES DAILY. 60 tablet 11   Fluticasone-Umeclidin-Vilant (TRELEGY ELLIPTA) 100-62.5-25 MCG/INH AEPB Inhale 1 puff into the lungs daily. 60 each 6   methocarbamol (ROBAXIN) 500 MG tablet Take 1 tablet (500 mg total) by mouth 4 (four) times daily. 30 tablet 1   naproxen sodium (ALEVE) 220 MG tablet Take 220 mg by mouth daily as needed (pain).     oxyCODONE (OXY IR/ROXICODONE) 5 MG immediate release tablet Take 1-2 tablets (5-10 mg total) by mouth every 6 (six) hours  as needed for severe pain. 30 tablet 0   prochlorperazine (COMPAZINE) 10 MG tablet Take 1 tablet (10 mg total) by mouth every 6 (six) hours as needed for nausea or vomiting. (Patient not taking: Reported on 02/27/2021) 30 tablet 0   No current facility-administered medications for this visit.    REVIEW OF SYSTEMS:   .10 Point review of Systems was done is negative except as noted above.  PHYSICAL EXAMINATION: ECOG PERFORMANCE STATUS: 1 - Symptomatic but completely ambulatory  . GENERAL:alert, in no acute distress and comfortable SKIN: no acute rashes, no significant lesions EYES: conjunctiva are pink and non-injected, sclera anicteric OROPHARYNX: MMM, no exudates, no oropharyngeal erythema or ulceration NECK: supple, no JVD LYMPH:  no palpable lymphadenopathy in the cervical, axillary or inguinal regions LUNGS: clear to auscultation b/l with normal respiratory effort HEART: regular rate & rhythm ABDOMEN:  normoactive bowel sounds , non tender, not distended. Extremity: no pedal edema PSYCH: alert & oriented x 3 with fluent speech NEURO: no focal motor/sensory deficits     LABORATORY DATA:  I have reviewed the data as listed  . CBC Latest Ref Rng & Units 03/20/2021 02/27/2021 02/03/2021  WBC 4.0 - 10.5 K/uL 8.6 8.0 6.1  Hemoglobin 13.0  - 17.0 g/dL 15.8 15.7 15.0  Hematocrit 39.0 - 52.0 % 46.1 46.9 43.9  Platelets 150 - 400 K/uL 221 223 224    . CMP Latest Ref Rng & Units 03/20/2021 02/27/2021 02/03/2021  Glucose 70 - 99 mg/dL 104(H) 146(H) 113(H)  BUN 8 - 23 mg/dL _0 Creatinine 0.61 - 1.24 mg/dL 1.01 1.24 1.04  Sodium 135 - 145 mmol/L 138 134(L) 140  Potassium 3.5 - 5.1 mmol/L 4.5 4.1 4.2  Chloride 98 - 111 mmol/L 110 104 109  CO2 22 - 32 mmol/L 20(L) 20(L) 22  Calcium 8.9 - 10.3 mg/dL 8.9 8.9 9.2  Total Protein 6.5 - 8.1 g/dL 7.6 7.6 7.5  Total Bilirubin 0.3 - 1.2 mg/dL 0.8 0.8 0.6  Alkaline Phos 38 - 126 U/L 133(H) 116 127(H)  AST 15 - 41 U/L _1 ALT 0 - 44 U/L _2 SURGICAL PATHOLOGY  CASE: [id number]  PATIENT: Jullian Gago  Surgical Pathology Report   Clinical History: right posterior shoulder soft tissue mass (cm)   FINAL MICROSCOPIC DIAGNOSIS:   A. SOFT TISSUE MASS, RIGHT POSTERIOR SHOULDER, NEEDLE CORE BIOPSY:  -  Malignant melanoma  -  See comment   COMMENT:   By immunohistochemistry, the neoplastic cells are positive for S100,  HMB45, Sox 10 and Melan-A (patchy) but negative for CD45, CD138,  cytokeratin 7, cytokeratin 20, cytokeratin AE1/3, desmin and EMA.  The  morphology and immunophenotype are consistent with malignant melanoma.  Dr. Irene Limbo was notified of these results on November 24, 2020.  Dr. Saralyn Pilar  reviewed the case and agrees with the above diagnosis.    RADIOGRAPHIC STUDIES: I have personally reviewed the radiological images as listed and agreed with the findings in the report. No results found.   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  Unclear primary site with the patient presented with a metastatic deposit in the soft tissue/skin over the right posterior shoulder. Patient also has multiple slightly hypermetabolic lymph nodes-right  axillary, subpectoral and right cervical.  These could be from metastatic melanoma or a possible indolent lymphoma related to his IgM kappa  monoclonal paraproteinemia. -Molecular testing was sent on the pathology sample and is BRAF positive -PD-L1 testing positive for PD-L1 expression at 1% PLAN: -Discussed pt's labwork today, 03/20/2021.  CBC within normal limits, CMP unremarkable. -Patient with no prohibitive toxicities from cycle 3 of ipilimumab/nivolumab treatment. -No other acute new symptoms.  No signs of melanoma progression at this time. -Follow-up with dermatology for skin screening every 6 to 12 months -Again discussed smoking cessation. -Patient appropriate to proceed with cycle 4 of ipilimumab/nivolumab treatment. -PET/CT in 1-2 weeks -only Nivolumab from next cycle  3) nasal allergies -Patient recommended to use team and over-the-counter antihistamines as needed. -If he has a fever or symptoms persist or he develops throat pain or shortness of breath he should have additional evaluation including COVID and flu testing.  Patient at this time feels like this is his regular allergy.  FOLLOW UP Please schedule next 3 cycles of nivolumab with labs and MD visits. PET CT scan in 2 weeks    All of the patients questions were answered with apparent satisfaction. The patient knows to call the clinic with any problems, questions or concerns.  . The total time spent in the appointment was 30 minutes and more than 50% was on counseling and direct patient cares.   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

## 2021-03-26 NOTE — Telephone Encounter (Signed)
Left message with follow-up appointments per 11/4 los.

## 2021-03-29 NOTE — Addendum Note (Signed)
Addended by: Sullivan Lone on: 03/29/2021 11:34 PM   Modules accepted: Orders

## 2021-03-29 NOTE — Addendum Note (Signed)
Addended by: Sullivan Lone on: 03/29/2021 11:48 PM   Modules accepted: Orders

## 2021-03-31 ENCOUNTER — Other Ambulatory Visit: Payer: Self-pay

## 2021-03-31 NOTE — Progress Notes (Signed)
Pt contacted to come in 04/01/21 for labs before work. Pt acknowledged

## 2021-04-01 ENCOUNTER — Inpatient Hospital Stay: Payer: BC Managed Care – PPO

## 2021-04-01 ENCOUNTER — Other Ambulatory Visit: Payer: Self-pay

## 2021-04-01 DIAGNOSIS — Z5112 Encounter for antineoplastic immunotherapy: Secondary | ICD-10-CM

## 2021-04-01 DIAGNOSIS — C4360 Malignant melanoma of unspecified upper limb, including shoulder: Secondary | ICD-10-CM | POA: Diagnosis not present

## 2021-04-01 DIAGNOSIS — C4359 Malignant melanoma of other part of trunk: Secondary | ICD-10-CM

## 2021-04-01 DIAGNOSIS — Z79899 Other long term (current) drug therapy: Secondary | ICD-10-CM | POA: Diagnosis not present

## 2021-04-01 DIAGNOSIS — C799 Secondary malignant neoplasm of unspecified site: Secondary | ICD-10-CM | POA: Diagnosis not present

## 2021-04-01 LAB — CBC WITH DIFFERENTIAL (CANCER CENTER ONLY)
Abs Immature Granulocytes: 0.02 10*3/uL (ref 0.00–0.07)
Basophils Absolute: 0 10*3/uL (ref 0.0–0.1)
Basophils Relative: 0 %
Eosinophils Absolute: 0.3 10*3/uL (ref 0.0–0.5)
Eosinophils Relative: 4 %
HCT: 46.3 % (ref 39.0–52.0)
Hemoglobin: 15.2 g/dL (ref 13.0–17.0)
Immature Granulocytes: 0 %
Lymphocytes Relative: 26 %
Lymphs Abs: 2 10*3/uL (ref 0.7–4.0)
MCH: 31.5 pg (ref 26.0–34.0)
MCHC: 32.8 g/dL (ref 30.0–36.0)
MCV: 95.9 fL (ref 80.0–100.0)
Monocytes Absolute: 0.7 10*3/uL (ref 0.1–1.0)
Monocytes Relative: 10 %
Neutro Abs: 4.6 10*3/uL (ref 1.7–7.7)
Neutrophils Relative %: 60 %
Platelet Count: 296 10*3/uL (ref 150–400)
RBC: 4.83 MIL/uL (ref 4.22–5.81)
RDW: 12.9 % (ref 11.5–15.5)
WBC Count: 7.6 10*3/uL (ref 4.0–10.5)
nRBC: 0 % (ref 0.0–0.2)

## 2021-04-01 LAB — CMP (CANCER CENTER ONLY)
ALT: 23 U/L (ref 0–44)
AST: 15 U/L (ref 15–41)
Albumin: 3.5 g/dL (ref 3.5–5.0)
Alkaline Phosphatase: 115 U/L (ref 38–126)
Anion gap: 10 (ref 5–15)
BUN: 11 mg/dL (ref 8–23)
CO2: 21 mmol/L — ABNORMAL LOW (ref 22–32)
Calcium: 9 mg/dL (ref 8.9–10.3)
Chloride: 109 mmol/L (ref 98–111)
Creatinine: 1.09 mg/dL (ref 0.61–1.24)
GFR, Estimated: >60 mL/min (ref >60)
Glucose, Bld: 145 mg/dL — ABNORMAL HIGH (ref 70–99)
Potassium: 4.3 mmol/L (ref 3.5–5.1)
Sodium: 140 mmol/L (ref 135–145)
Total Bilirubin: 0.6 mg/dL (ref 0.3–1.2)
Total Protein: 7 g/dL (ref 6.5–8.1)

## 2021-04-01 LAB — TSH: TSH: <0.08 u[IU]/mL — ABNORMAL LOW (ref 0.320–4.118)

## 2021-04-01 LAB — T4, FREE: Free T4: 1.04 ng/dL (ref 0.61–1.12)

## 2021-04-02 LAB — T3, FREE: T3, Free: 3.2 pg/mL (ref 2.0–4.4)

## 2021-04-07 ENCOUNTER — Encounter: Payer: Self-pay | Admitting: Hematology

## 2021-04-08 ENCOUNTER — Ambulatory Visit (HOSPITAL_COMMUNITY): Payer: BC Managed Care – PPO

## 2021-04-08 ENCOUNTER — Ambulatory Visit: Payer: BC Managed Care – PPO | Admitting: Hematology

## 2021-04-08 ENCOUNTER — Other Ambulatory Visit: Payer: BC Managed Care – PPO

## 2021-04-08 ENCOUNTER — Encounter (HOSPITAL_COMMUNITY): Payer: Self-pay

## 2021-04-10 ENCOUNTER — Ambulatory Visit: Payer: BC Managed Care – PPO

## 2021-04-16 ENCOUNTER — Ambulatory Visit (HOSPITAL_COMMUNITY)
Admission: RE | Admit: 2021-04-16 | Discharge: 2021-04-16 | Disposition: A | Discharge status: Home / Self Care | Payer: BC Managed Care – PPO | Source: Ambulatory Visit | Attending: Hematology | Admitting: Hematology

## 2021-04-16 DIAGNOSIS — C4359 Malignant melanoma of other part of trunk: Secondary | ICD-10-CM | POA: Diagnosis not present

## 2021-04-16 DIAGNOSIS — C792 Secondary malignant neoplasm of skin: Secondary | ICD-10-CM | POA: Diagnosis not present

## 2021-04-16 LAB — GLUCOSE, CAPILLARY: Glucose-Capillary: 88 mg/dL (ref 70–99)

## 2021-04-16 MED ORDER — FLUDEOXYGLUCOSE F - 18 (FDG) INJECTION
9.5000 | Freq: Once | INTRAVENOUS | Status: AC
  Administered 2021-04-16: 9.5 via INTRAVENOUS

## 2021-04-24 ENCOUNTER — Inpatient Hospital Stay: Payer: BC Managed Care – PPO | Attending: Hematology

## 2021-04-24 ENCOUNTER — Inpatient Hospital Stay (HOSPITAL_BASED_OUTPATIENT_CLINIC_OR_DEPARTMENT_OTHER): Payer: BC Managed Care – PPO | Admitting: Hematology

## 2021-04-24 ENCOUNTER — Other Ambulatory Visit: Payer: Self-pay

## 2021-04-24 ENCOUNTER — Inpatient Hospital Stay: Payer: BC Managed Care – PPO

## 2021-04-24 VITALS — BP 139/88 | HR 65 | Temp 98.1°F | Resp 20 | Wt 180.2 lb

## 2021-04-24 DIAGNOSIS — G4489 Other headache syndrome: Secondary | ICD-10-CM

## 2021-04-24 DIAGNOSIS — Z5112 Encounter for antineoplastic immunotherapy: Secondary | ICD-10-CM | POA: Diagnosis not present

## 2021-04-24 DIAGNOSIS — C4359 Malignant melanoma of other part of trunk: Secondary | ICD-10-CM

## 2021-04-24 DIAGNOSIS — C799 Secondary malignant neoplasm of unspecified site: Secondary | ICD-10-CM | POA: Diagnosis not present

## 2021-04-24 DIAGNOSIS — C4360 Malignant melanoma of unspecified upper limb, including shoulder: Secondary | ICD-10-CM | POA: Diagnosis not present

## 2021-04-24 DIAGNOSIS — R7989 Other specified abnormal findings of blood chemistry: Secondary | ICD-10-CM | POA: Diagnosis not present

## 2021-04-24 DIAGNOSIS — Z79899 Other long term (current) drug therapy: Secondary | ICD-10-CM | POA: Diagnosis not present

## 2021-04-24 LAB — CBC WITH DIFFERENTIAL (CANCER CENTER ONLY)
Abs Immature Granulocytes: 0.01 10*3/uL (ref 0.00–0.07)
Basophils Absolute: 0.1 10*3/uL (ref 0.0–0.1)
Basophils Relative: 1 %
Eosinophils Absolute: 0.4 10*3/uL (ref 0.0–0.5)
Eosinophils Relative: 4 %
HCT: 46.9 % (ref 39.0–52.0)
Hemoglobin: 15.5 g/dL (ref 13.0–17.0)
Immature Granulocytes: 0 %
Lymphocytes Relative: 29 %
Lymphs Abs: 2.3 10*3/uL (ref 0.7–4.0)
MCH: 31.6 pg (ref 26.0–34.0)
MCHC: 33 g/dL (ref 30.0–36.0)
MCV: 95.5 fL (ref 80.0–100.0)
Monocytes Absolute: 1 10*3/uL (ref 0.1–1.0)
Monocytes Relative: 12 %
Neutro Abs: 4.3 10*3/uL (ref 1.7–7.7)
Neutrophils Relative %: 54 %
Platelet Count: 212 10*3/uL (ref 150–400)
RBC: 4.91 MIL/uL (ref 4.22–5.81)
RDW: 13 % (ref 11.5–15.5)
WBC Count: 8 10*3/uL (ref 4.0–10.5)
nRBC: 0 % (ref 0.0–0.2)

## 2021-04-24 LAB — TSH: TSH: <0.08 u[IU]/mL — ABNORMAL LOW (ref 0.320–4.118)

## 2021-04-24 LAB — CMP (CANCER CENTER ONLY)
ALT: 23 U/L (ref 0–44)
AST: 20 U/L (ref 15–41)
Albumin: 3.8 g/dL (ref 3.5–5.0)
Alkaline Phosphatase: 121 U/L (ref 38–126)
Anion gap: 9 (ref 5–15)
BUN: 11 mg/dL (ref 8–23)
CO2: 20 mmol/L — ABNORMAL LOW (ref 22–32)
Calcium: 9 mg/dL (ref 8.9–10.3)
Chloride: 109 mmol/L (ref 98–111)
Creatinine: 1.03 mg/dL (ref 0.61–1.24)
GFR, Estimated: >60 mL/min (ref >60)
Glucose, Bld: 99 mg/dL (ref 70–99)
Potassium: 4.4 mmol/L (ref 3.5–5.1)
Sodium: 138 mmol/L (ref 135–145)
Total Bilirubin: 0.6 mg/dL (ref 0.3–1.2)
Total Protein: 7.5 g/dL (ref 6.5–8.1)

## 2021-04-24 MED ORDER — SODIUM CHLORIDE 0.9 % IV SOLN: Freq: Once | INTRAVENOUS | Status: AC

## 2021-04-24 MED ORDER — SODIUM CHLORIDE 0.9 % IV SOLN
240.0000 mg | Freq: Once | INTRAVENOUS | Status: AC
  Administered 2021-04-24: 240 mg via INTRAVENOUS
  Filled 2021-04-24: qty 24

## 2021-04-24 NOTE — Patient Instructions (Signed)
[facility] MEDICAL ONCOLOGY  Discharge Instructions: ?Thank you for choosing [facility] to provide your oncology and hematology care.  ? ?If you have a lab appointment with the Cancer Center, please go directly to the Cancer Center and check in at the registration area. ?  ?Wear comfortable clothing and clothing appropriate for easy access to any Portacath or PICC line.  ? ?We strive to give you quality time with your provider. You may need to reschedule your appointment if you arrive late (15 or more minutes).  Arriving late affects you and other patients whose appointments are after yours.  Also, if you miss three or more appointments without notifying the office, you may be dismissed from the clinic at the provider?s discretion.    ?  ?For prescription refill requests, have your pharmacy contact our office and allow 72 hours for refills to be completed.   ? ?Today you received the following chemotherapy and/or immunotherapy agents Opdivo    ?  ?To help prevent nausea and vomiting after your treatment, we encourage you to take your nausea medication as directed. ? ?BELOW ARE SYMPTOMS THAT SHOULD BE REPORTED IMMEDIATELY: ?*FEVER GREATER THAN 100.4 F (38 ?C) OR HIGHER ?*CHILLS OR SWEATING ?*NAUSEA AND VOMITING THAT IS NOT CONTROLLED WITH YOUR NAUSEA MEDICATION ?*UNUSUAL SHORTNESS OF BREATH ?*UNUSUAL BRUISING OR BLEEDING ?*URINARY PROBLEMS (pain or burning when urinating, or frequent urination) ?*BOWEL PROBLEMS (unusual diarrhea, constipation, pain near the anus) ?TENDERNESS IN MOUTH AND THROAT WITH OR WITHOUT PRESENCE OF ULCERS (sore throat, sores in mouth, or a toothache) ?UNUSUAL RASH, SWELLING OR PAIN  ?UNUSUAL VAGINAL DISCHARGE OR ITCHING  ? ?Items with * indicate a potential emergency and should be followed up as soon as possible or go to the Emergency Department if any problems should occur. ? ?Please show the CHEMOTHERAPY ALERT CARD or IMMUNOTHERAPY ALERT CARD at check-in to the  Emergency Department and triage nurse. ? ?Should you have questions after your visit or need to cancel or reschedule your appointment, please contact [facility] MEDICAL [facility]COL[facility]  Dept: [phone]  and follow the prompts.  Office hours are 8:00 a.m. to 4:30 p.m. Monday - Friday. Please note that voicemails left after 4:00 p.m. may not be returned until the following business day.  We are closed weekends and major holidays. You have access to a nurse at all times for urgent questions. Please call the main number to the clinic Dept: [phone] and follow the prompts. ? ? ?For any non-urgent questions, you may also contact your provider using MyChart. We now offer e-Visits for anyone 18 and older to request care online for non-urgent symptoms. For details visit [email or web address]. ?  ?Also download the MyChart app! Go to the app store, search "MyChart", open the app, select [facility], and log in with your MyChart username and password. ? ?Due to Covid, a mask is required upon entering the hospital/clinic. If you do not have a mask, one will be given to you upon arrival. For doctor visits, patients may have 1 support person aged 18 or older with them. For treatment visits, patients cannot have anyone with them due to current Covid guidelines and our immunocompromised population.  ? ?

## 2021-04-30 ENCOUNTER — Encounter: Payer: Self-pay | Admitting: Oncology

## 2021-04-30 NOTE — Progress Notes (Signed)
HEMATOLOGY/ONCOLOGY CLINIC NOTE  Date of Service: .04/24/2021  Patient Care Team: [name], MD as PCP - General (Internal Medicine)  CHIEF COMPLAINTS/PURPOSE OF CONSULTATION:  Follow-up for continued management of metastatic melanoma  HISTORY OF PRESENTING ILLNESS:   George West is a wonderful 60 y.o. male who has been referred to [place] by 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 The pt reports that he is doing well overall. We are joined today by his wife.  The pt reports that he originally presented in October 2021 with SOB that had worsened recently. [name] had concerns of neuropathy, which led to all the labs done and signs of abnormal proteins. The protein is on Thymine, but denies Vitamin B12. 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 This numbness is accompanied by burning sensation, purple in color,intermittent swelling, but the pt denies cramping. The pt denies tingling/numbness in hands, pinched nerves in the back. He does note that he heard a popped in his back around two months ago, which immobilized him for a week. This did not affect his neuropathy. The pt notes he also has recently been experiencing lightheadedness/dizziness, which makes him have to stand still. He denies feelings of spinning around and notes this most often occurs after standing up.   The pt notes that he experienced a vasovagal syncope in October 2021, which lasted around 2-3 minutes and led to an ED visit. The pt's SOB occurs after walking short distances. The Pulmonologist got lung testing and an ECHO performed in November, which showed tight airways and decreased performance. 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 The ECHO showed normal function of heart. The pt notes he is on an inhaler (Combivent) but notes minimal help. The pt reports that he smokes 1/2 pack a day currently, down from  1 pack daily.  The pt notes that his primary driver for having to stop walking or doing activity is his SOB. The neuropathy is primary cause once sitting down for extended periods of time. The pt denies any problems with falling or imbalance. He denies using a cane/walking stick.  The pt notes that he has had an enlarged lymph node in the neck area one year ago and has remain unchanged since then. He also notes a large bump in his upper back right. The pt notes his brother also has this.  The pt notes that he drinks a case of beer a week currently. Over five years ago, the pt notes that he drank two cases a week and a fifth of hard liquor a week in addition.Th pt notes that he has not drank water since he was a teenager, as he only drinks tea, coffee, beer, and liquor.  02/20/2020 IFE showed an IGM Kappa Monoclonal Protein detected.  02/20/2020 Serum protein electrophoresis showed two abnormal protein bands of 0.5 and 0.4.   On review of systems, pt reports SOB, neuropathy of lower extremities, lightheadedness/dizziness and denies muscle weakness, n/v/d, abdominal pain and any other symptoms.  INTERVAL HISTORY  George West is here for follow-up of his metastatic melanoma and continued management with immunotherapy. Patient's last clinic visit with [place] was on 03/20/2021.  Patient notes some grade 1 diarrhea which is better.  We discussed that due to immunotherapy.  He notes no fevers chills abdominal discomfort with this.  No blood or mucus in the stools.  Diarrhea starting to resolve.  We discussed this is likely from his combination immunotherapy.  He shall be off ipilimumab for further treatments.  Patient had a PET CT scan on 04/16/2021 which showed 1. Moderate metabolic activity in excision cavity of RIGHT back melanoma. Favor postsurgical inflammation. 2. Interval decrease in size and metabolic activity of RIGHT axillary and sub pectoralis lymph nodes. Lymph nodes are now normal size  and with metabolic activity less than mediastinal blood pool. 3. No evidence of metastatic melanoma.  Patient notes some headache in the forehead area.  This seems to come and go but the patient notes that it is very unusual for him to have a headache.  His previous MRI on 12/19/2020 for staging have not shown any brain metastases.  In the context of low TSH and normal free T4 and free T3 levels will need to rule out hypophysitis.  Discussed with patient and MRI of the brain ordered.  We also discussed giving him a referral to endocrinology to evaluate his follow-up status and he is agreeable with this.  Labs today 04/24/2021 were reviewed with him in details.    MEDICAL HISTORY:  Past Medical History:  Diagnosis Date   Cancer ([facility])    COPD (chronic obstructive pulmonary disease) ([facility])    Depression    Dyspnea    with 95f of ambulation. 200 feet if he walks slow.   Fatty liver    History of kidney stones    seen on CT Scan    SURGICAL HISTORY: Past Surgical History:  Procedure Laterality Date   MASS EXCISION Right 12/15/2020   Procedure: EXCISION OF MALIGNANT MASS RIGHT POSTERIOR SHOULDER;  Surgeon: BStark Klein MD;  Location: [facility]  Service: General;  Laterality: Right;   WISDOM TOOTH EXTRACTION      SOCIAL HISTORY: Social History   Socioeconomic History   Marital status: Married    Spouse name: Not on file   Number of children: 2   Years of education: Not on file   Highest education level: Not on file  Occupational History   Occupation: PPublic librarian   Comment: [employer]  Tobacco Use   Smoking status: Every Day    Packs/day: 0.50    Years: 40.00    Pack years: 20.00    Types: Cigarettes    Start date: 05/17/1981   Smokeless tobacco: Never  Vaping Use   Vaping Use: Never used  Substance and Sexual Activity   Alcohol use: Yes    Alcohol/week: 6.0 standard drinks    Types: 4 Cans of beer, 2 Standard drinks or equivalent per week   Drug use:  No   Sexual activity: Yes    Partners: Female    Birth control/protection: None  Other Topics Concern   Not on file  Social History Narrative   Married   2 children--- 1 daughter/1 son   6 step children   Social Determinants of Health   Financial Resource Strain: Not on file  Food Insecurity: Not on file  Transportation Needs: Not on file  Physical Activity: Not on file  Stress: Not on file  Social Connections: Not on file  Intimate Partner Violence: Not on file    FAMILY HISTORY: Family History  Problem Relation Age of Onset   Stroke Mother    Dementia Mother    Stroke Maternal Grandmother    Breast cancer Sister    Thyroid cancer Sister    Breast cancer Paternal Grandmother     ALLERGIES:  is allergic  to duloxetine.  MEDICATIONS:  Current Outpatient Medications  Medication Sig Dispense Refill   albuterol (VENTOLIN HFA) 108 (90 Base) MCG/ACT inhaler Inhale 2 puffs into the lungs every 4 (four) hours as needed for wheezing or shortness of breath. 1 each 6   buPROPion (WELLBUTRIN) 100 MG tablet TAKE 1 TABLET BY MOUTH 2 TIMES DAILY. 60 tablet 11   Fluticasone-Umeclidin-Vilant (TRELEGY ELLIPTA) 100-62.5-25 MCG/INH AEPB Inhale 1 puff into the lungs daily. 60 each 6   prochlorperazine (COMPAZINE) 10 MG tablet Take 1 tablet (10 mg total) by mouth every 6 (six) hours as needed for nausea or vomiting. 30 tablet 0   methocarbamol (ROBAXIN) 500 MG tablet Take 1 tablet (500 mg total) by mouth 4 (four) times daily. 30 tablet 1   naproxen sodium (ALEVE) 220 MG tablet Take 220 mg by mouth daily as needed (pain).     oxyCODONE (OXY IR/ROXICODONE) 5 MG immediate release tablet Take 1-2 tablets (5-10 mg total) by mouth every 6 (six) hours as needed for severe pain. 30 tablet 0   No current facility-administered medications for this visit.    REVIEW OF SYSTEMS:   .10 Point review of Systems was done is negative except as noted above.   PHYSICAL EXAMINATION: ECOG PERFORMANCE  STATUS: 1 - Symptomatic but completely ambulatory  . GENERAL:alert, in no acute distress and comfortable SKIN: no acute rashes, no significant lesions EYES: conjunctiva are pink and non-injected, sclera anicteric OROPHARYNX: MMM, no exudates, no oropharyngeal erythema or ulceration NECK: supple, no JVD LYMPH:  no palpable lymphadenopathy in the cervical, axillary or inguinal regions LUNGS: clear to auscultation b/l with normal respiratory effort HEART: regular rate & rhythm ABDOMEN:  normoactive bowel sounds , non tender, not distended. Extremity: no pedal edema PSYCH: alert & oriented x 3 with fluent speech NEURO: no focal motor/sensory deficits     LABORATORY DATA:  I have reviewed the data as listed  . CBC Latest Ref Rng & Units 04/24/2021 04/01/2021 03/20/2021  WBC 4.0 - 10.5 K/uL 8.0 7.6 8.6  Hemoglobin 13.0 - 17.0 g/dL 15.5 15.2 15.8  Hematocrit 39.0 - 52.0 % 46.9 46.3 46.1  Platelets 150 - 400 K/uL 212 296 221    . CMP Latest Ref Rng & Units 04/24/2021 04/01/2021 03/20/2021  Glucose 70 - 99 mg/dL 99 145(H) 104(H)  BUN 8 - 23 mg/dL 11 11 12   Creatinine 0.61 - 1.24 mg/dL 1.03 1.09 1.01  Sodium 135 - 145 mmol/L 138 140 138  Potassium 3.5 - 5.1 mmol/L 4.4 4.3 4.5  Chloride 98 - 111 mmol/L 109 109 110  CO2 22 - 32 mmol/L 20(L) 21(L) 20(L)  Calcium 8.9 - 10.3 mg/dL 9.0 9.0 8.9  Total Protein 6.5 - 8.1 g/dL 7.5 7.0 7.6  Total Bilirubin 0.3 - 1.2 mg/dL 0.6 0.6 0.8  Alkaline Phos 38 - 126 U/L 121 115 133(H)  AST 15 - 41 U/L 20 15 19   ALT 0 - 44 U/L 23 23 25    SURGICAL PATHOLOGY  CASE: [id number]  PATIENT: George West  Surgical Pathology Report   Clinical History: right posterior shoulder soft tissue mass (cm)   FINAL MICROSCOPIC DIAGNOSIS:   A. SOFT TISSUE MASS, RIGHT POSTERIOR SHOULDER, NEEDLE CORE BIOPSY:  -  Malignant melanoma  -  See comment   COMMENT:   By immunohistochemistry, the neoplastic cells are positive for S100,  HMB45, Sox 10 and Melan-A  (patchy) but negative for CD45, CD138,  cytokeratin 7, cytokeratin 20, cytokeratin AE1/3, desmin and EMA.  The  morphology and immunophenotype are consistent with malignant melanoma.  Dr. Irene Limbo was notified of these results on November 24, 2020.  Dr. Saralyn Pilar  reviewed the case and agrees with the above diagnosis.    RADIOGRAPHIC STUDIES: I have personally reviewed the radiological images as listed and agreed with the findings in the report. NM PET Image Restage (PS) Whole Body  Result Date: 04/17/2021 CLINICAL DATA:  Subsequent treatment strategy for metastatic melanoma. Melanoma of the RIGHT shoulder with excision 02/03/2021. EXAM: NUCLEAR MEDICINE PET WHOLE BODY TECHNIQUE: 9.5 mCi F-18 FDG was injected intravenously. Full-ring PET imaging was performed from the head to foot after the radiotracer. CT data was obtained and used for attenuation correction and anatomic localization. Fasting blood glucose: 88 mg/dl COMPARISON:  PET-CT scan 12/03/2020 FINDINGS: Mediastinal blood pool activity: SUV max 2.4 HEAD/NECK: No hypermetabolic activity in the scalp. No hypermetabolic cervical lymph nodes. Incidental CT findings: none CHEST: In the posterior RIGHT chest wall, superficial to the scapula interval excision of the hypermetabolic mass. There is moderate metabolic activity within the excision cavity with SUV max equal 5.2 (image 91) Previously enlarged RIGHT axial lymph nodes are decreased in size. For example 12 mm axial lymph node on prior exam now measures 7 mm (image 99/4). Similarly the sub pectoralis lymph nodes are decreased in size. These axillary lymph nodes have moderate metabolic activity on comparison exam SUV max equal 3.6. Now residual lymph node activity is less than blood pool at SUV max equal 1.2. No hypermetabolic supraclavicular nodes. No hypermetabolic mediastinal nodes. No suspicious pulmonary nodules. Angular nodule in the RIGHT middle lobe measuring 6 mm is unchanged Incidental CT findings:  none ABDOMEN/PELVIS: No abnormal hypermetabolic activity within the liver, pancreas, adrenal glands, or spleen. No hypermetabolic lymph nodes in the abdomen or pelvis. Incidental CT findings: none SKELETON: No focal hypermetabolic activity to suggest skeletal metastasis. Incidental CT findings: none EXTREMITIES: No abnormal hypermetabolic activity in the lower extremities. Incidental CT findings: none IMPRESSION: 1. Moderate metabolic activity in excision cavity of RIGHT back melanoma. Favor postsurgical inflammation. 2. Interval decrease in size and metabolic activity of RIGHT axillary and sub pectoralis lymph nodes. Lymph nodes are now normal size and with metabolic activity less than mediastinal blood pool. 3. No evidence of metastatic melanoma. Electronically Signed   By: Suzy Bouchard M.D.   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  Unclear primary site with the patient presented with a metastatic deposit in the soft tissue/skin over the right posterior shoulder. Patient also has multiple slightly hypermetabolic lymph nodes-right axillary, subpectoral and right cervical.  These could be from metastatic melanoma or a possible indolent lymphoma related to his IgM kappa monoclonal paraproteinemia. -Molecular testing was sent on the pathology sample and is BRAF positive -PD-L1 testing positive for PD-L1 expression at 1% PLAN: -Discussed pt's labwork today, 04/24/2021-CBC within normal limits, CMP unremarkable TSH less than 0.08 -PET/CT done on 04/16/2021 showed Moderate metabolic activity in excision cavity of RIGHT back melanoma. Favor postsurgical inflammation. 2. Interval decrease in size and metabolic activity of RIGHT axillary and sub pectoralis lymph nodes. Lymph nodes are now normal size and with metabolic activity less than mediastinal blood  pool. 3. No evidence of metastatic melanoma. -Follow-up with dermatology for skin screening every 6 to 12 months Again discussed smoking cessation. -Patient has completed 4 cycles of ipilimumab and will  only be on nivolumab every 2 weeks at this time.  Appropriate to proceed with next cycle of nivolumab.  3) grade 1 diarrhea likely from ipilimumab plus nivolumab.  Resolving of ipilimumab. Plan -Patient is off ipilimumab is going to be on q14days nivolumab at this time. -We will monitor diarrhea.  4) low TSH with normal free T4 and free T3 ?  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 -MRI brain to evaluate his headaches and pituitary.  . Orders Placed This Encounter  Procedures   George Brain W Wo Contrast    Standing Status:   Future    Standing Expiration Date:   04/30/2022    Order Specific Question:   If indicated for the ordered procedure, I authorize the administration of contrast media per Radiology protocol    Answer:   Yes    Order Specific Question:   What is the patient's sedation requirement?    Answer:   No Sedation    Order Specific Question:   Does the patient have a pacemaker or implanted devices?    Answer:   No    Order Specific Question:   Use SRS Protocol?    Answer:   No    Order Specific Question:   Preferred imaging location?    Answer:   [facility] (table limit - 550 lbs)   Ambulatory referral to Endocrinology    Referral Priority:   Urgent    Referral Type:   Consultation    Referral Reason:   Specialty Services Required    Number of Visits Requested:   1    FOLLOW UP Referral to [place] endocrinology-urgent in 1 week MRI brain stat for headaches r/o metastatic melanoma and hypophysitis from immunotherapy. Please follow-up for next 2 doses of nivolumab as scheduled    All of the patients questions were answered with apparent satisfaction. The patient knows  to call the clinic with any problems, questions or concerns.  . The total time spent in the appointment was 31 minutes on review of PET scan and labs, ordering and management of immunotherapy.    Sullivan Lone MD [place] [facility] Hematology/Oncology Physician [facility]

## 2021-05-05 ENCOUNTER — Telehealth: Payer: Self-pay

## 2021-05-05 DIAGNOSIS — L600 Ingrowing nail: Secondary | ICD-10-CM

## 2021-05-05 NOTE — Telephone Encounter (Signed)
[place] Night - Client Nonclinical Telephone Record  [facility] Client [facility] [place] Night - Client Client Site [place] - Night Provider Viviana Simpler- MD Contact Type Call Who Is Calling Patient / Member / Family / Caregiver Caller Name [place] Phone Number [phone] Patient Name George West Patient DOB 17-Jun-1960 Call Type Message Only Information Provided Reason for Call Request for General Office Information Initial Comment Caller states her husband has an ingrown toenail, and he would like a referral to a podiatrist. Additional Comment Office hours provided. Disp. Time Disposition Final User 05/05/2021 8:01:53 AM General Information Provided Yes Steward Ros Call Closed By: Steward Ros Transaction Date/Time: 05/05/2021 7:59:38 AM (ET

## 2021-05-06 NOTE — Telephone Encounter (Signed)
Spoke to wife per DPR. Pt has appt tomorrow.

## 2021-05-07 ENCOUNTER — Ambulatory Visit (INDEPENDENT_AMBULATORY_CARE_PROVIDER_SITE_OTHER): Payer: BC Managed Care – PPO | Admitting: Podiatry

## 2021-05-07 ENCOUNTER — Other Ambulatory Visit: Payer: Self-pay

## 2021-05-07 ENCOUNTER — Encounter: Payer: Self-pay | Admitting: Podiatry

## 2021-05-07 DIAGNOSIS — L600 Ingrowing nail: Secondary | ICD-10-CM

## 2021-05-07 NOTE — Progress Notes (Signed)
[facility] Follow up:    George Carbon, MD [place] [address]   DIAGNOSIS:  Cancer Staging  No matching staging information was found for the patient.  SUMMARY OF ONCOLOGIC HISTORY: Oncology History  Malignant melanoma of torso excluding breast (George West)  12/24/2020 Initial Diagnosis   Malignant melanoma of torso excluding breast (George West) Unclear primary site with the patient presented with a metastatic deposit in the soft tissue/skin over the right posterior shoulder. Patient also has multiple slightly hypermetabolic lymph nodes-right axillary, subpectoral and right cervical.  These could be from metastatic melanoma or a possible indolent lymphoma related to his IgM kappa monoclonal paraproteinemia. -Molecular testing was sent on the pathology sample and is BRAF positive -PD-L1 testing positive for PD-L1 expression at 1%   01/13/2021 -  Chemotherapy   Patient is on Treatment Plan : RENAL CELL CARCINOMA Nivolumab + Ipilimumab q21d / Nivolumab q14d     04/16/2021 PET scan   Moderate metabolic activity in excision cavity of RIGHT back melanoma. Favor postsurgical inflammation. 2. Interval decrease in size and metabolic activity of RIGHT axillary and sub pectoralis lymph nodes. Lymph nodes are now normal size and with metabolic activity less than mediastinal blood pool.     CURRENT THERAPY: Nivolumab every 2 weeks  INTERVAL HISTORY: George West 60 y.o. male returns for evaluation of his malignant melanoma, currently under treatment with nivolumab given every 14 days.  He most recently underwent imaging with a PET scan on April 16, 2021.  This showed no evidence of metastatic disease.  He has been having headaches and an MRI of the brain is scheduled on May 14, 2021.  Nethan is doing well today.  He says that he is not having any issues with his treatment thus far.  He was referred to endocrinology by Dr. Irene Limbo his wife is questioning  when she might hear something from their office.    George West wife notes that he was not eating very much this past weekend however he is feeling fine today.  His weight is stable and unchanged.   Patient Active Problem List   Diagnosis Date Noted   Malignant melanoma of torso excluding breast ([place]) 12/24/2020   Soft tissue mass 10/01/2020   COPD with exacerbation ([place]) 05/21/2020   Monoclonal gammopathy 03/24/2020   Vasovagal syncope 02/23/2020   Sensory loss 02/20/2020   Mood disorder ([place]) 02/20/2020   Shortness of breath on exertion 08/30/2017   Vitamin D deficiency 12/02/2016   Fatigue 11/30/2016   Alcohol use disorder, moderate, dependence ([place]) 11/30/2016   Tobacco use disorder 11/30/2016   Lack of interest 11/30/2016    is allergic to duloxetine.  MEDICAL HISTORY: Past Medical History:  Diagnosis Date   Cancer ([place])    COPD (chronic obstructive pulmonary disease) (H[facility])    Depression    Dyspnea    with 8f of ambulation. 200 feet if he walks slow.   Fatty liver    History of kidney stones    seen on CT Scan    SURGICAL HISTORY: Past Surgical History:  Procedure Laterality Date   MASS EXCISION Right 12/15/2020   Procedure: EXCISION OF MALIGNANT MASS RIGHT POSTERIOR SHOULDER;  Surgeon: BStark Klein MD;  [facility]  Service: General;  Laterality: Right;   WISDOM TOOTH EXTRACTION      SOCIAL HISTORY: Social History   Socioeconomic History   Marital status: Married    Spouse name: Not on file   Number  of children: 2   Years of education: Not on file   Highest education level: Not on file  Occupational History   Occupation: Public librarian    Comment: [employer]  Tobacco Use   Smoking status: Every Day    Packs/day: 0.50    Years: 40.00    Pack years: 20.00    Types: Cigarettes    Start date: 05/17/1981   Smokeless tobacco: Never  Vaping Use   Vaping Use: Never used  Substance and Sexual Activity   Alcohol use: Yes     Alcohol/week: 6.0 standard drinks    Types: 4 Cans of beer, 2 Standard drinks or equivalent per week   Drug use: No   Sexual activity: Yes    Partners: Female    Birth control/protection: None  Other Topics Concern   Not on file  Social History Narrative   Married   2 children--- 1 daughter/1 son   6 step children   Social Determinants of Health   Financial Resource Strain: Not on file  Food Insecurity: Not on file  Transportation Needs: Not on file  Physical Activity: Not on file  Stress: Not on file  Social Connections: Not on file  Intimate Partner Violence: Not on file    FAMILY HISTORY: Family History  Problem Relation Age of Onset   Stroke Mother    Dementia Mother    Stroke Maternal Grandmother    Breast cancer Sister    Thyroid cancer Sister    Breast cancer Paternal Grandmother     Review of Systems  Constitutional:  Positive for appetite change (See interval history). Negative for chills, fatigue, fever and unexpected weight change.  HENT:   Negative for hearing loss, lump/mass and trouble swallowing.   Eyes:  Negative for eye problems and icterus.  Respiratory:  Negative for chest tightness, cough and shortness of breath.   Cardiovascular:  Negative for chest pain, leg swelling and palpitations.  Gastrointestinal:  Negative for abdominal distention, abdominal pain, constipation, diarrhea, nausea and vomiting.  Endocrine: Negative for hot flashes.  Genitourinary:  Negative for difficulty urinating.   Musculoskeletal:  Negative for arthralgias.  Skin:  Negative for itching and rash.  Neurological:  Negative for dizziness, extremity weakness, headaches and numbness.  Hematological:  Negative for adenopathy. Does not bruise/bleed easily.  Psychiatric/Behavioral:  Negative for depression. The patient is not nervous/anxious.      PHYSICAL EXAMINATION  ECOG PERFORMANCE STATUS: 1 - Symptomatic but completely ambulatory  Vitals:   05/08/21 0934  BP: 116/76   Pulse: 73  Resp: 17  Temp: 98.1 F (36.7 C)  SpO2: 96%    Physical Exam Constitutional:      General: He is not in acute distress.    Appearance: Normal appearance. He is not toxic-appearing.  HENT:     Head: Normocephalic and atraumatic.  Eyes:     General: No scleral icterus. Cardiovascular:     Rate and Rhythm: Normal rate and regular rhythm.     Pulses: Normal pulses.     Heart sounds: Normal heart sounds.  Pulmonary:     Effort: Pulmonary effort is normal.     Breath sounds: Normal breath sounds.  Abdominal:     General: Abdomen is flat. Bowel sounds are normal. There is no distension.     Palpations: Abdomen is soft.     Tenderness: There is no abdominal tenderness.  Musculoskeletal:        General: No swelling.  Cervical back: Neck supple.  Lymphadenopathy:     Cervical: No cervical adenopathy.  Skin:    General: Skin is warm and dry.     Findings: No rash.  Neurological:     General: No focal deficit present.     Mental Status: He is alert.  Psychiatric:        Mood and Affect: Mood normal.        Behavior: Behavior normal.    LABORATORY DATA:  CBC    Component Value Date/Time   WBC 8.9 05/08/2021 0923   WBC 8.1 12/03/2020 0853   RBC 4.70 05/08/2021 0923   HGB 15.0 05/08/2021 0923   HGB 15.8 08/25/2017 0836   HCT 44.4 05/08/2021 0923   HCT 45.5 08/25/2017 0836   PLT 269 05/08/2021 0923   PLT 212 08/25/2017 0836   MCV 94.5 05/08/2021 0923   MCV 98 (H) 08/25/2017 0836   MCH 31.9 05/08/2021 0923   MCHC 33.8 05/08/2021 0923   RDW 12.3 05/08/2021 0923   RDW 13.7 08/25/2017 0836   LYMPHSABS 3.0 05/08/2021 0923   LYMPHSABS 2.5 08/25/2017 0836   MONOABS 1.1 (H) 05/08/2021 0923   EOSABS 0.5 05/08/2021 0923   EOSABS 0.3 08/25/2017 0836   BASOSABS 0.1 05/08/2021 0923   BASOSABS 0.0 08/25/2017 0836    CMP     Component Value Date/Time   NA 137 05/08/2021 0923   NA 141 08/25/2017 0836   K 4.2 05/08/2021 0923   CL 104 05/08/2021 0923   CO2  25 05/08/2021 0923   GLUCOSE 87 05/08/2021 0923   BUN 10 05/08/2021 0923   BUN 9 08/25/2017 0836   CREATININE 1.17 05/08/2021 0923   CALCIUM 9.1 05/08/2021 0923   PROT 7.5 05/08/2021 0923   PROT 7.0 08/25/2017 0836   ALBUMIN 3.9 05/08/2021 0923   ALBUMIN 4.2 08/25/2017 0836   AST 19 05/08/2021 0923   ALT 18 05/08/2021 0923   ALKPHOS 122 05/08/2021 0923   BILITOT 0.7 05/08/2021 0923   GFRNONAA >60 05/08/2021 0923   GFRAA 114 08/25/2017 0836     ASSESSMENT and PLAN:   Malignant melanoma of torso excluding breast ([place]) George West is a 60 year old male who continues on nivolumab therapy for melanoma.  He is due for repeat cycle today.  1.  Melanoma: He continues on nivolumab therapy with good tolerance.  He will continue this.  2.  Headaches: He is going to undergo MRI of the brain on 05/14/2021.  Right now these are not particular bothersome to him.  3.  Appetite change.  This appears to have been transient and is not a current issue.  His weight is stable and unchanged.  He will continue to eat his regular diet and on days that he is not eating a lot I encouraged him to drink Ensure.  4.  Abnormal thyroid studies: I placed another referral to the low [facility] endocrinology.  I also asked my nurse to follow-up with this.  Burhanuddin return in 2 weeks for labs, follow-up with Dr. Irene Limbo, and his next infusion.   Orders Placed This Encounter  Procedures   Ambulatory referral to Endocrinology    Referral Priority:   Urgent    Referral Type:   Consultation    Referral Reason:   Specialty Services Required    Number of Visits Requested:   1    All questions were answered. The patient knows to call the clinic with any problems, questions or concerns. We can certainly see the patient  much sooner if necessary.  Total encounter time: 20 minutes in face-to-face visit time, chart review, lab review, care coordination, documentation of the encounter.  Wilber Bihari, NP 05/08/21 2:20 PM Medical  Oncology and Hematology [facility] [place], [place] [address] Tel. [phone]    Fax. [phone]  *Total Encounter Time as defined by the Centers for Medicare and Medicaid Services includes, in addition to the face-to-face time of a patient visit (documented in the note above) non-face-to-face time: obtaining and reviewing outside history, ordering and reviewing medications, tests or procedures, care coordination (communications with other health care professionals or caregivers) and documentation in the medical record.

## 2021-05-07 NOTE — Progress Notes (Signed)
Subjective:  Patient ID: George West, male    DOB: [birth year],  MRN: [id number]  Chief Complaint  Patient presents with   Ingrown Toenail    Right hallux     60 y.o. male presents with the above complaint.  Patient presents with bilateral medial border ingrown.  Patient states is painful to touch.  Patient would like for me to remove it.  He is tried is doing some self debridement which has not helped has progressed to gotten worse.  It is not clinically infected.  She denies any other acute complaints.  Hurts with ambulation pain scale 7 out of 10 hurts with pressure   Review of Systems: Negative except as noted in the HPI. Denies N/V/F/Ch.  Past Medical History:  Diagnosis Date   Cancer ([place])    COPD (chronic obstructive pulmonary disease) (HCC)    Depression    Dyspnea    with 33ft of ambulation. 200 feet if he walks slow.   Fatty liver    History of kidney stones    seen on CT Scan    Current Outpatient Medications:    albuterol (VENTOLIN HFA) 108 (90 Base) MCG/ACT inhaler, Inhale 2 puffs into the lungs every 4 (four) hours as needed for wheezing or shortness of breath., Disp: 1 each, Rfl: 6   buPROPion (WELLBUTRIN) 100 MG tablet, TAKE 1 TABLET BY MOUTH 2 TIMES DAILY., Disp: 60 tablet, Rfl: 11   Fluticasone-Umeclidin-Vilant (TRELEGY ELLIPTA) 100-62.5-25 MCG/INH AEPB, Inhale 1 puff into the lungs daily., Disp: 60 each, Rfl: 6   methocarbamol (ROBAXIN) 500 MG tablet, Take 1 tablet (500 mg total) by mouth 4 (four) times daily., Disp: 30 tablet, Rfl: 1   naproxen sodium (ALEVE) 220 MG tablet, Take 220 mg by mouth daily as needed (pain)., Disp: , Rfl:    oxyCODONE (OXY IR/ROXICODONE) 5 MG immediate release tablet, Take 1-2 tablets (5-10 mg total) by mouth every 6 (six) hours as needed for severe pain., Disp: 30 tablet, Rfl: 0   prochlorperazine (COMPAZINE) 10 MG tablet, Take 1 tablet (10 mg total) by mouth every 6 (six) hours as needed for nausea or vomiting., Disp: 30 tablet,  Rfl: 0  Social History   Tobacco Use  Smoking Status Every Day   Packs/day: 0.50   Years: 40.00   Pack years: 20.00   Types: Cigarettes   Start date: 05/17/1981  Smokeless Tobacco Never    Allergies  Allergen Reactions   Duloxetine     dizziness   Objective:  There were no vitals filed for this visit. There is no height or weight on file to calculate BMI. Constitutional Well developed. Well nourished.  Vascular Dorsalis pedis pulses palpable bilaterally. Posterior tibial pulses palpable bilaterally. Capillary refill normal to all digits.  No cyanosis or clubbing noted. Pedal hair growth normal.  Neurologic Normal speech. Oriented to person, place, and time. Epicritic sensation to light touch grossly present bilaterally.  Dermatologic Painful ingrowing nail at medial nail borders of the hallux nail bilaterally. No other open wounds. No skin lesions.  Orthopedic: Normal joint ROM without pain or crepitus bilaterally. No visible deformities. No bony tenderness.   Radiographs: None Assessment:   1. Ingrown toenail of right foot   2. Ingrown left big toenail    Plan:  Patient was evaluated and treated and all questions answered.  Ingrown Nail, bilaterally -Patient elects to proceed with minor surgery to remove ingrown toenail removal today. Consent reviewed and signed by patient. -Ingrown nail excised. See procedure note. -Educated  on post-procedure care including soaking. Written instructions provided and reviewed. -Patient to follow up in 2 weeks for nail check.  Procedure: Excision of Ingrown Toenail Location: Bilateral 1st toe medial nail borders. Anesthesia: Lidocaine 1% plain; 1.5 mL and Marcaine 0.5% plain; 1.5 mL, digital block. Skin Prep: Betadine. Dressing: Silvadene; telfa; dry, sterile, compression dressing. Technique: Following skin prep, the toe was exsanguinated and a tourniquet was secured at the base of the toe. The affected nail border was freed,  split with a nail splitter, and excised. Chemical matrixectomy was then performed with phenol and irrigated out with alcohol. The tourniquet was then removed and sterile dressing applied. Disposition: Patient tolerated procedure well. Patient to return in 2 weeks for follow-up.   No follow-ups on file.

## 2021-05-08 ENCOUNTER — Inpatient Hospital Stay (HOSPITAL_BASED_OUTPATIENT_CLINIC_OR_DEPARTMENT_OTHER): Payer: BC Managed Care – PPO | Admitting: Adult Health

## 2021-05-08 ENCOUNTER — Inpatient Hospital Stay: Payer: BC Managed Care – PPO

## 2021-05-08 ENCOUNTER — Encounter: Payer: Self-pay | Admitting: Adult Health

## 2021-05-08 ENCOUNTER — Other Ambulatory Visit: Payer: Self-pay | Admitting: Oncology

## 2021-05-08 VITALS — BP 116/76 | HR 73 | Temp 98.1°F | Resp 17 | Wt 178.4 lb

## 2021-05-08 DIAGNOSIS — C4359 Malignant melanoma of other part of trunk: Secondary | ICD-10-CM

## 2021-05-08 DIAGNOSIS — R7989 Other specified abnormal findings of blood chemistry: Secondary | ICD-10-CM | POA: Diagnosis not present

## 2021-05-08 DIAGNOSIS — Z79899 Other long term (current) drug therapy: Secondary | ICD-10-CM | POA: Diagnosis not present

## 2021-05-08 DIAGNOSIS — C799 Secondary malignant neoplasm of unspecified site: Secondary | ICD-10-CM | POA: Diagnosis not present

## 2021-05-08 DIAGNOSIS — C4360 Malignant melanoma of unspecified upper limb, including shoulder: Secondary | ICD-10-CM | POA: Diagnosis not present

## 2021-05-08 DIAGNOSIS — Z5112 Encounter for antineoplastic immunotherapy: Secondary | ICD-10-CM | POA: Diagnosis not present

## 2021-05-08 LAB — CBC WITH DIFFERENTIAL (CANCER CENTER ONLY)
Abs Immature Granulocytes: 0.02 10*3/uL (ref 0.00–0.07)
Basophils Absolute: 0.1 10*3/uL (ref 0.0–0.1)
Basophils Relative: 1 %
Eosinophils Absolute: 0.5 10*3/uL (ref 0.0–0.5)
Eosinophils Relative: 6 %
HCT: 44.4 % (ref 39.0–52.0)
Hemoglobin: 15 g/dL (ref 13.0–17.0)
Immature Granulocytes: 0 %
Lymphocytes Relative: 33 %
Lymphs Abs: 3 10*3/uL (ref 0.7–4.0)
MCH: 31.9 pg (ref 26.0–34.0)
MCHC: 33.8 g/dL (ref 30.0–36.0)
MCV: 94.5 fL (ref 80.0–100.0)
Monocytes Absolute: 1.1 10*3/uL — ABNORMAL HIGH (ref 0.1–1.0)
Monocytes Relative: 12 %
Neutro Abs: 4.3 10*3/uL (ref 1.7–7.7)
Neutrophils Relative %: 48 %
Platelet Count: 269 10*3/uL (ref 150–400)
RBC: 4.7 MIL/uL (ref 4.22–5.81)
RDW: 12.3 % (ref 11.5–15.5)
WBC Count: 8.9 10*3/uL (ref 4.0–10.5)
nRBC: 0 % (ref 0.0–0.2)

## 2021-05-08 LAB — CMP (CANCER CENTER ONLY)
ALT: 18 U/L (ref 0–44)
AST: 19 U/L (ref 15–41)
Albumin: 3.9 g/dL (ref 3.5–5.0)
Alkaline Phosphatase: 122 U/L (ref 38–126)
Anion gap: 8 (ref 5–15)
BUN: 10 mg/dL (ref 8–23)
CO2: 25 mmol/L (ref 22–32)
Calcium: 9.1 mg/dL (ref 8.9–10.3)
Chloride: 104 mmol/L (ref 98–111)
Creatinine: 1.17 mg/dL (ref 0.61–1.24)
GFR, Estimated: >60 mL/min (ref >60)
Glucose, Bld: 87 mg/dL (ref 70–99)
Potassium: 4.2 mmol/L (ref 3.5–5.1)
Sodium: 137 mmol/L (ref 135–145)
Total Bilirubin: 0.7 mg/dL (ref 0.3–1.2)
Total Protein: 7.5 g/dL (ref 6.5–8.1)

## 2021-05-08 LAB — TSH: TSH: <0.08 u[IU]/mL — ABNORMAL LOW (ref 0.320–4.118)

## 2021-05-08 MED ORDER — SODIUM CHLORIDE 0.9 % IV SOLN: Freq: Once | INTRAVENOUS | Status: AC

## 2021-05-08 MED ORDER — SODIUM CHLORIDE 0.9 % IV SOLN
240.0000 mg | Freq: Once | INTRAVENOUS | Status: AC
  Administered 2021-05-08: 11:00:00 240 mg via INTRAVENOUS
  Filled 2021-05-08: qty 24

## 2021-05-08 NOTE — Assessment & Plan Note (Signed)
George West is a 60 year old male who continues on nivolumab therapy for melanoma.  He is due for repeat cycle today.  1.  Melanoma: He continues on nivolumab therapy with good tolerance.  He will continue this.  2.  Headaches: He is going to undergo MRI of the brain on 05/14/2021.  Right now these are not particular bothersome to him.  3.  Appetite change.  This appears to have been transient and is not a current issue.  His weight is stable and unchanged.  He will continue to eat his regular diet and on days that he is not eating a lot I encouraged him to drink Ensure.  4.  Abnormal thyroid studies: I placed another referral to the low [facility] endocrinology.  I also asked my nurse to follow-up with this.  George West return in 2 weeks for labs, follow-up with Dr. Irene Limbo, and his next infusion.

## 2021-05-08 NOTE — Progress Notes (Signed)
Called [facility], left VM to follow up on referrals and get pt scheduled.

## 2021-05-14 ENCOUNTER — Ambulatory Visit (HOSPITAL_COMMUNITY)
Admission: RE | Admit: 2021-05-14 | Discharge: 2021-05-14 | Disposition: A | Discharge status: Home / Self Care | Payer: BC Managed Care – PPO | Source: Ambulatory Visit | Attending: Hematology | Admitting: Hematology

## 2021-05-14 DIAGNOSIS — G9389 Other specified disorders of brain: Secondary | ICD-10-CM | POA: Diagnosis not present

## 2021-05-14 DIAGNOSIS — G4489 Other headache syndrome: Secondary | ICD-10-CM | POA: Insufficient documentation

## 2021-05-14 DIAGNOSIS — R519 Headache, unspecified: Secondary | ICD-10-CM | POA: Diagnosis not present

## 2021-05-14 DIAGNOSIS — C4359 Malignant melanoma of other part of trunk: Secondary | ICD-10-CM | POA: Diagnosis not present

## 2021-05-14 MED ORDER — GADOBUTROL 1 MMOL/ML IV SOLN
7.5000 mL | Freq: Once | INTRAVENOUS | Status: AC | PRN
  Administered 2021-05-14: 20:00:00 7.5 mL via INTRAVENOUS

## 2021-05-19 ENCOUNTER — Telehealth: Payer: Self-pay

## 2021-05-19 ENCOUNTER — Encounter: Payer: Self-pay | Admitting: Hematology

## 2021-05-19 NOTE — Telephone Encounter (Signed)
Contacted pt's wife regarding MyChart message and pt's s/s. Pt is COVID negative. Per Dr Irene Limbo encouraged pt to be seen by PCP. Pt has appointment at [facility] on 05/22/21. Wife to call back if pt unable to be seen by PCP.

## 2021-05-22 ENCOUNTER — Other Ambulatory Visit: Payer: Self-pay

## 2021-05-22 ENCOUNTER — Inpatient Hospital Stay (HOSPITAL_BASED_OUTPATIENT_CLINIC_OR_DEPARTMENT_OTHER): Payer: BC Managed Care – PPO | Admitting: Hematology

## 2021-05-22 ENCOUNTER — Inpatient Hospital Stay: Payer: BC Managed Care – PPO

## 2021-05-22 ENCOUNTER — Inpatient Hospital Stay: Payer: BC Managed Care – PPO | Attending: Hematology

## 2021-05-22 VITALS — BP 98/78 | HR 89 | Temp 98.1°F | Resp 20 | Wt 176.5 lb

## 2021-05-22 DIAGNOSIS — D352 Benign neoplasm of pituitary gland: Secondary | ICD-10-CM

## 2021-05-22 DIAGNOSIS — C799 Secondary malignant neoplasm of unspecified site: Secondary | ICD-10-CM | POA: Diagnosis not present

## 2021-05-22 DIAGNOSIS — F17210 Nicotine dependence, cigarettes, uncomplicated: Secondary | ICD-10-CM | POA: Diagnosis not present

## 2021-05-22 DIAGNOSIS — R55 Syncope and collapse: Secondary | ICD-10-CM | POA: Insufficient documentation

## 2021-05-22 DIAGNOSIS — Z20822 Contact with and (suspected) exposure to covid-19: Secondary | ICD-10-CM | POA: Diagnosis not present

## 2021-05-22 DIAGNOSIS — E237 Disorder of pituitary gland, unspecified: Secondary | ICD-10-CM | POA: Insufficient documentation

## 2021-05-22 DIAGNOSIS — Z85820 Personal history of malignant melanoma of skin: Secondary | ICD-10-CM | POA: Diagnosis not present

## 2021-05-22 DIAGNOSIS — E860 Dehydration: Secondary | ICD-10-CM | POA: Diagnosis not present

## 2021-05-22 DIAGNOSIS — R7989 Other specified abnormal findings of blood chemistry: Secondary | ICD-10-CM | POA: Diagnosis not present

## 2021-05-22 DIAGNOSIS — N179 Acute kidney failure, unspecified: Secondary | ICD-10-CM | POA: Insufficient documentation

## 2021-05-22 DIAGNOSIS — Z79899 Other long term (current) drug therapy: Secondary | ICD-10-CM | POA: Diagnosis not present

## 2021-05-22 DIAGNOSIS — C4360 Malignant melanoma of unspecified upper limb, including shoulder: Secondary | ICD-10-CM | POA: Insufficient documentation

## 2021-05-22 DIAGNOSIS — C4359 Malignant melanoma of other part of trunk: Secondary | ICD-10-CM

## 2021-05-22 DIAGNOSIS — Z01812 Encounter for preprocedural laboratory examination: Secondary | ICD-10-CM | POA: Diagnosis not present

## 2021-05-22 DIAGNOSIS — E2740 Unspecified adrenocortical insufficiency: Secondary | ICD-10-CM | POA: Insufficient documentation

## 2021-05-22 DIAGNOSIS — I951 Orthostatic hypotension: Secondary | ICD-10-CM | POA: Diagnosis not present

## 2021-05-22 DIAGNOSIS — G939 Disorder of brain, unspecified: Secondary | ICD-10-CM | POA: Diagnosis not present

## 2021-05-22 DIAGNOSIS — K859 Acute pancreatitis without necrosis or infection, unspecified: Secondary | ICD-10-CM | POA: Diagnosis not present

## 2021-05-22 DIAGNOSIS — R079 Chest pain, unspecified: Secondary | ICD-10-CM | POA: Diagnosis not present

## 2021-05-22 DIAGNOSIS — J449 Chronic obstructive pulmonary disease, unspecified: Secondary | ICD-10-CM | POA: Diagnosis not present

## 2021-05-22 DIAGNOSIS — I6782 Cerebral ischemia: Secondary | ICD-10-CM | POA: Diagnosis not present

## 2021-05-22 LAB — CMP (CANCER CENTER ONLY)
ALT: 15 U/L (ref 0–44)
AST: 22 U/L (ref 15–41)
Albumin: 3.6 g/dL (ref 3.5–5.0)
Alkaline Phosphatase: 107 U/L (ref 38–126)
Anion gap: 9 (ref 5–15)
BUN: 14 mg/dL (ref 8–23)
CO2: 23 mmol/L (ref 22–32)
Calcium: 9.7 mg/dL (ref 8.9–10.3)
Chloride: 103 mmol/L (ref 98–111)
Creatinine: 1.11 mg/dL (ref 0.61–1.24)
GFR, Estimated: >60 mL/min
Glucose, Bld: 115 mg/dL — ABNORMAL HIGH (ref 70–99)
Potassium: 4.1 mmol/L (ref 3.5–5.1)
Sodium: 135 mmol/L (ref 135–145)
Total Bilirubin: 0.8 mg/dL (ref 0.3–1.2)
Total Protein: 7.2 g/dL (ref 6.5–8.1)

## 2021-05-22 LAB — CBC WITH DIFFERENTIAL (CANCER CENTER ONLY)
Abs Immature Granulocytes: 0.01 10*3/uL (ref 0.00–0.07)
Basophils Absolute: 0 10*3/uL (ref 0.0–0.1)
Basophils Relative: 1 %
Eosinophils Absolute: 0.4 10*3/uL (ref 0.0–0.5)
Eosinophils Relative: 6 %
HCT: 46.6 % (ref 39.0–52.0)
Hemoglobin: 15.3 g/dL (ref 13.0–17.0)
Immature Granulocytes: 0 %
Lymphocytes Relative: 33 %
Lymphs Abs: 2.5 10*3/uL (ref 0.7–4.0)
MCH: 31.4 pg (ref 26.0–34.0)
MCHC: 32.8 g/dL (ref 30.0–36.0)
MCV: 95.5 fL (ref 80.0–100.0)
Monocytes Absolute: 1 10*3/uL (ref 0.1–1.0)
Monocytes Relative: 13 %
Neutro Abs: 3.6 10*3/uL (ref 1.7–7.7)
Neutrophils Relative %: 47 %
Platelet Count: 151 10*3/uL (ref 150–400)
RBC: 4.88 MIL/uL (ref 4.22–5.81)
RDW: 12 % (ref 11.5–15.5)
WBC Count: 7.4 10*3/uL (ref 4.0–10.5)
nRBC: 0 % (ref 0.0–0.2)

## 2021-05-22 LAB — T4, FREE: Free T4: 1.82 ng/dL — ABNORMAL HIGH (ref 0.61–1.12)

## 2021-05-22 LAB — CORTISOL: Cortisol, Plasma: 1.1 ug/dL

## 2021-05-22 LAB — TSH: TSH: <0.08 u[IU]/mL — ABNORMAL LOW (ref 0.320–4.118)

## 2021-05-22 MED ORDER — HYDROCORTISONE 5 MG PO TABS: ORAL_TABLET | ORAL | 0 refills | Status: DC

## 2021-05-22 MED ORDER — SODIUM CHLORIDE 0.9 % IV SOLN: INTRAVENOUS | Status: DC

## 2021-05-22 NOTE — Patient Instructions (Signed)

## 2021-05-23 LAB — INSULIN-LIKE GROWTH FACTOR: Somatomedin C: 250 ng/mL — ABNORMAL HIGH (ref 64–240)

## 2021-05-23 LAB — PROLACTIN: Prolactin: 19.3 ng/mL — ABNORMAL HIGH (ref 4.0–15.2)

## 2021-05-23 LAB — ACTH: C206 ACTH: <1.5 pg/mL — ABNORMAL LOW (ref 7.2–63.3)

## 2021-05-25 LAB — ALDOSTERONE: Aldosterone: 22.3 ng/dL (ref 0.0–30.0)

## 2021-05-28 ENCOUNTER — Encounter: Payer: Self-pay | Admitting: Endocrinology

## 2021-05-28 ENCOUNTER — Encounter: Payer: Self-pay | Admitting: Oncology

## 2021-05-28 NOTE — Progress Notes (Addendum)
HEMATOLOGY/ONCOLOGY CLINIC NOTE  Date of Service: .05/22/2021  Patient Care Team: [name], MD as PCP - General (Internal Medicine)  CHIEF COMPLAINTS/PURPOSE OF CONSULTATION:  Follow-up for continued management of metastatic melanoma. Evaluation and management of orthostatic dizziness with concern for underlying insufficiency Newly diagnosed pituitary macroadenoma.  HISTORY OF PRESENTING ILLNESS:   George West is a wonderful 61 y.o. male who has been referred to [place] by 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 The pt reports that he is doing well overall. We are joined today by his wife.  The pt reports that he originally presented in October 2021 with SOB that had worsened recently. [name] had concerns of neuropathy, which led to all the labs done and signs of abnormal proteins. The protein is on Thymine, but denies Vitamin B12. 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 This numbness is accompanied by burning sensation, purple in color,intermittent swelling, but the pt denies cramping. The pt denies tingling/numbness in hands, pinched nerves in the back. He does note that he heard a popped in his back around two months ago, which immobilized him for a week. This did not affect his neuropathy. The pt notes he also has recently been experiencing lightheadedness/dizziness, which makes him have to stand still. He denies feelings of spinning around and notes this most often occurs after standing up.   The pt notes that he experienced a vasovagal syncope in October 2021, which lasted around 2-3 minutes and led to an ED visit. The pt's SOB occurs after walking short distances. The Pulmonologist got lung testing and an ECHO performed in November, which showed tight airways and decreased performance. 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 The ECHO showed normal function of  heart. The pt notes he is on an inhaler (Combivent) but notes minimal help. The pt reports that he smokes 1/2 pack a day currently, down from 1 pack daily.  The pt notes that his primary driver for having to stop walking or doing activity is his SOB. The neuropathy is primary cause once sitting down for extended periods of time. The pt denies any problems with falling or imbalance. He denies using a cane/walking stick.  The pt notes that he has had an enlarged lymph node in the neck area one year ago and has remain unchanged since then. He also notes a large bump in his upper back right. The pt notes his brother also has this.  The pt notes that he drinks a case of beer a week currently. Over five years ago, the pt notes that he drank two cases a week and a fifth of hard liquor a week in addition.Th pt notes that he has not drank water since he was a teenager, as he only drinks tea, coffee, beer, and liquor.  02/20/2020 IFE showed an IGM Kappa Monoclonal Protein detected.  02/20/2020 Serum protein electrophoresis showed two abnormal protein bands of 0.5 and 0.4.   On review of systems, pt reports SOB, neuropathy of lower extremities, lightheadedness/dizziness and denies muscle weakness, n/v/d, abdominal pain and any other symptoms.  INTERVAL HISTORY  George West is here for follow-up of continued evaluation and management of his metastatic melanoma and immunotherapy. He is accompanied by his wife for this clinic visit. He notes that he has been feeling significant fatigue and having presyncopal and orthostatic symptoms for the last 10 days.  Notes  some loose stools but no significant diarrhea. Has been having anorexia with decreased p.o. intake. No new skin rashes. His immunotherapy due to today was held due to his presyncopal symptomatology and concern for adrenal insufficiency. He has not received an endocrinology appointment yet. Visual field defects or change in vision.  MRI of  the brain done patient noted to have 4 mm hypoenhancing lesion in the left aspect of the pituitary favored to represent a pituitary microadenoma.  This was not definitively present on MRI on 12/19/2020.  No other acute intracranial process noted.  Patient received 1 to 2 L of IV normal saline with resolution of presyncopal symptoms . 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  He was also noted to have a suppressed TSH with an elevated free T4 level ?  Immune thyroiditis.  Patient was started on hydrocortisone for management of his adrenal insufficiency. Currently having no hypoglycemia. Bupropion was held  MEDICAL HISTORY:  Past Medical History:  Diagnosis Date   Cancer ([place])    COPD (chronic obstructive pulmonary disease) (HCC)    Depression    Dyspnea    with 38f of ambulation. 200 feet if he walks slow.   Fatty liver    History of kidney stones    seen on CT Scan    SURGICAL HISTORY: Past Surgical History:  Procedure Laterality Date   MASS EXCISION Right 12/15/2020   Procedure: EXCISION OF MALIGNANT MASS RIGHT POSTERIOR SHOULDER;  Surgeon: BStark Klein MD;  Location: [facility]  Service: General;  Laterality: Right;   WISDOM TOOTH EXTRACTION      SOCIAL HISTORY: Social History   Socioeconomic History   Marital status: Married    Spouse name: Not on file   Number of children: 2   Years of education: Not on file   Highest education level: Not on file  Occupational History   Occupation: PPublic librarian   Comment: [employer]  Tobacco Use   Smoking status: Every Day    Packs/day: 0.50    Years: 40.00    Pack years: 20.00    Types: Cigarettes    Start date: 05/17/1981   Smokeless tobacco: Never  Vaping Use   Vaping Use: Never used  Substance and Sexual Activity   Alcohol use: Yes    Alcohol/week: 6.0 standard drinks     Types: 4 Cans of beer, 2 Standard drinks or equivalent per week   Drug use: No   Sexual activity: Yes    Partners: Female    Birth control/protection: None  Other Topics Concern   Not on file  Social History Narrative   Married   2 children--- 1 daughter/1 son   6 step children   Social Determinants of Health   Financial Resource Strain: Not on file  Food Insecurity: Not on file  Transportation Needs: Not on file  Physical Activity: Not on file  Stress: Not on file  Social Connections: Not on file  Intimate Partner Violence: Not on file    FAMILY HISTORY: Family History  Problem Relation Age of Onset   Stroke Mother    Dementia Mother    Stroke Maternal Grandmother    Breast cancer Sister    Thyroid cancer Sister    Breast cancer Paternal Grandmother     ALLERGIES:  is allergic to duloxetine.  MEDICATIONS:  Current Outpatient Medications  Medication  Sig Dispense Refill   hydrocortisone (CORTEF) 5 MG tablet Please take 10 mg p.o. with breakfast and 5 mg p.o. with lunch every day 60 tablet 0   albuterol (VENTOLIN HFA) 108 (90 Base) MCG/ACT inhaler Inhale 2 puffs into the lungs every 4 (four) hours as needed for wheezing or shortness of breath. 1 each 6   buPROPion (WELLBUTRIN) 100 MG tablet TAKE 1 TABLET BY MOUTH 2 TIMES DAILY. 60 tablet 11   Fluticasone-Umeclidin-Vilant (TRELEGY ELLIPTA) 100-62.5-25 MCG/INH AEPB Inhale 1 puff into the lungs daily. 60 each 6   prochlorperazine (COMPAZINE) 10 MG tablet Take 1 tablet (10 mg total) by mouth every 6 (six) hours as needed for nausea or vomiting. 30 tablet 0   No current facility-administered medications for this visit.    REVIEW OF SYSTEMS:   .10 Point review of Systems was done is negative except as noted above.  PHYSICAL EXAMINATION: ECOG PERFORMANCE STATUS: 1 - Symptomatic but completely ambulatory  .BP 98/78    Pulse 89    Temp 98.1 F (36.7 C)    Resp 20    Wt 176 lb 8 oz (80.1 kg)    SpO2 95%    BMI 24.62 kg/m    GENERAL:alert, in no acute distress and comfortable SKIN: no acute rashes, no significant lesions EYES: conjunctiva are pink and non-injected, sclera anicteric OROPHARYNX: MMM, no exudates, no oropharyngeal erythema or ulceration NECK: supple, no JVD LYMPH:  no palpable lymphadenopathy in the cervical, axillary or inguinal regions LUNGS: clear to auscultation b/l with normal respiratory effort HEART: regular rate & rhythm ABDOMEN:  normoactive bowel sounds , non tender, not distended. Extremity: no pedal edema PSYCH: alert & oriented x 3 with fluent speech NEURO: no focal motor/sensory deficits  LABORATORY DATA:  I have reviewed the data as listed  . CBC Latest Ref Rng & Units 05/22/2021 05/08/2021 04/24/2021  WBC 4.0 - 10.5 K/uL 7.4 8.9 8.0  Hemoglobin 13.0 - 17.0 g/dL 15.3 15.0 15.5  Hematocrit 39.0 - 52.0 % 46.6 44.4 46.9  Platelets 150 - 400 K/uL 151 269 212    . CMP Latest Ref Rng & Units 05/22/2021 05/08/2021 04/24/2021  Glucose 70 - 99 mg/dL 115(H) 87 99  BUN 8 - 23 mg/dL 14 10 11   Creatinine 0.61 - 1.24 mg/dL 1.11 1.17 1.03  Sodium 135 - 145 mmol/L 135 137 138  Potassium 3.5 - 5.1 mmol/L 4.1 4.2 4.4  Chloride 98 - 111 mmol/L 103 104 109  CO2 22 - 32 mmol/L 23 25 20(L)  Calcium 8.9 - 10.3 mg/dL 9.7 9.1 9.0  Total Protein 6.5 - 8.1 g/dL 7.2 7.5 7.5  Total Bilirubin 0.3 - 1.2 mg/dL 0.8 0.7 0.6  Alkaline Phos 38 - 126 U/L 107 122 121  AST 15 - 41 U/L 22 19 20   ALT 0 - 44 U/L 15 18 23    SURGICAL PATHOLOGY  CASE: [id number]  PATIENT: George West  Surgical Pathology Report   Clinical History: right posterior shoulder soft tissue mass (cm)   FINAL MICROSCOPIC DIAGNOSIS:   A. SOFT TISSUE MASS, RIGHT POSTERIOR SHOULDER, NEEDLE CORE BIOPSY:  -  Malignant melanoma  -  See comment   COMMENT:   By immunohistochemistry, the neoplastic cells are positive for S100,  HMB45, Sox 10 and Melan-A (patchy) but negative for CD45, CD138,  cytokeratin 7, cytokeratin 20,  cytokeratin AE1/3, desmin and EMA.  The  morphology and immunophenotype are consistent with malignant melanoma.  Dr. Irene Limbo was notified of these results on  November 24, 2020.  Dr. Saralyn Pilar  reviewed the case and agrees with the above diagnosis.    RADIOGRAPHIC STUDIES: I have personally reviewed the radiological images as listed and agreed with the findings in the report. [facility] Brain W Wo Contrast  Result Date: 05/15/2021 CLINICAL DATA:  Headaches, history of melanoma, low TSH rule out hypophysitis EXAM: MRI HEAD WITHOUT AND WITH CONTRAST TECHNIQUE: Multiplanar, multiecho pulse sequences of the brain and surrounding structures were obtained without and with intravenous contrast. CONTRAST:  7.46m GADAVIST GADOBUTROL 1 MMOL/ML IV SOLN COMPARISON:  12/19/2020 FINDINGS: Brain: No restricted diffusion to suggest acute or subacute infarct. No acute hemorrhage, mass, mass effect, or midline shift. No abnormal parenchymal enhancement. No foci of hemosiderin deposition to suggest remote hemorrhage. T2 hyperintense signal in the periventricular white matter and pons, likely the sequela of chronic small vessel ischemic disease and unchanged from the prior exam. On postcontrast imaging, there is a hypoenhancing lesion in the left aspect of the pituitary, which measures 3 x 4 x 4 mm (series 17, image 17 and series 18, image 30). This was not definitively present on the prior exam. Normal appearance of the infundibulum. No hydrocephalus or extra-axial collection. Vascular: Normal flow voids. Skull and upper cervical spine: Normal marrow signal. No abnormal enhancement. Sinuses/Orbits: Negative. Other: None. IMPRESSION: 1. 4 mm hypoenhancing lesion in the left aspect of the pituitary, favored to represent a pituitary microadenoma. This was not definitively present on the 12/19/2020 exam; however comparison is limited by slice thickness. 2. No other acute intracranial process. Electronically Signed   By: AMerilyn BabaM.D.   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  Unclear primary site with the patient presented with a metastatic deposit in the soft tissue/skin over the right posterior shoulder. Patient also has multiple slightly hypermetabolic lymph nodes-right axillary, subpectoral and right cervical.  These could be from metastatic melanoma or a possible indolent lymphoma related to his IgM kappa monoclonal paraproteinemia. -Molecular testing was sent on the pathology sample and is BRAF positive -PD-L1 testing positive for PD-L1 expression at 1%  3) grade 1 diarrhea likely from ipilimumab plus nivolumab.  Resolving of ipilimumab. 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 -He was also noted to have adrenal insufficiency with a cortisol level of 1.1 and undetectable ACTH levels.  Unclear if this is from immune hypophysitis or from his pituitary microadenoma. -Other endocrine work-up sent for his pituitary microadenoma to accelerate the work-up pending endocrinology appointment which is taking a while. -MRI brain results reviewed with the patient which shows a 4 mm microadenoma endocrine work-up sent an endocrinology referral has been given and faxed.  She has an appointment with Dr. SRenato Shinon 06/02/2021. -We will hold his planned immunotherapy today due to presyncopal symptoms.  EKG negative for arrhythmia. -Patient received 1 to 2 L of normal saline with resolution of presyncopal symptoms. -We will  start the patient on hydrocortisone 10 mg with breakfast and 5 mg with lunch for adrenal insufficiency.  We will need to double dose if patient still  having symptoms.  Trying to avoid excessive steroid use since this will reverse the effects of his immunotherapy. -Recommended eating salty foods and staying well-hydrated and drinking at least 2 L of water daily. -We will hold off on bupropion at this time to avoid additional orthostatic changes.  . Orders Placed This Encounter  Procedures   Cortisol    Standing Status:   Future    Number of Occurrences:   1    Standing Expiration Date:   05/22/2022   T4, free    Standing Status:   Future    Number of Occurrences:   1    Standing Expiration Date:   05/22/2022   ACTH    Standing Status:   Future    Number of Occurrences:   1    Standing Expiration Date:   05/22/2022   Prolactin    Standing Status:   Future    Number of Occurrences:   1    Standing Expiration Date:   05/22/2022   Insulin-like growth factor (Somatomedin-C)    Standing Status:   Future    Number of Occurrences:   1    Standing Expiration Date:   05/22/2022   Aldosterone    Standing Status:   Future    Number of Occurrences:   1    Standing Expiration Date:   05/22/2022    Order Specific Question:   Patient Supine or Upright?    Answer:   SUPINE   EKG 12-Lead    Standing Status:   Future    Number of Occurrences:   1    Standing Expiration Date:   05/22/2022    FOLLOW UP Holding further immunotherapy at this time pending work-up for his pituitary microadenoma and adrenal insufficiency and immune thyroiditis by endocrinology. Return to clinic with Dr. Irene Limbo in 3 weeks.    All of the patients questions were answered with apparent satisfaction. The patient knows to call the clinic with any problems, questions or concerns.  Total time spent 35 minutes on evaluation and assessment of the patient's hypotension and presyncopal symptoms, arranging and ordering IV fluids, evaluation  of EKG, evaluation of labs done for his hypotension, discussion of adrenal insufficiency and prescribing steroids for this and coordinating arranging for endocrinology referral, documentation.    Sullivan Lone MD MS AAHIVMS [facility] Hematology/Oncology [facility]Marland Kitchen

## 2021-05-31 NOTE — Addendum Note (Signed)
Addended by: Sullivan Lone on: 05/31/2021 05:57 PM   Modules accepted: Orders

## 2021-06-01 ENCOUNTER — Telehealth: Payer: Self-pay | Admitting: Hematology

## 2021-06-01 NOTE — Telephone Encounter (Signed)
Sch per 1/15 inbasket, pt wife aware.

## 2021-06-02 ENCOUNTER — Other Ambulatory Visit: Payer: Self-pay

## 2021-06-02 ENCOUNTER — Encounter: Payer: Self-pay | Admitting: Endocrinology

## 2021-06-02 ENCOUNTER — Ambulatory Visit (INDEPENDENT_AMBULATORY_CARE_PROVIDER_SITE_OTHER): Payer: BC Managed Care – PPO | Admitting: Endocrinology

## 2021-06-02 DIAGNOSIS — E0590 Thyrotoxicosis, unspecified without thyrotoxic crisis or storm: Secondary | ICD-10-CM | POA: Diagnosis not present

## 2021-06-02 MED ORDER — FLUDROCORTISONE ACETATE 0.1 MG PO TABS: 0.1000 mg | ORAL_TABLET | Freq: Every day | ORAL | 3 refills | Status: DC

## 2021-06-02 MED ORDER — METHIMAZOLE 10 MG PO TABS: 20.0000 mg | ORAL_TABLET | Freq: Two times a day (BID) | ORAL | 5 refills | Status: DC

## 2021-06-02 NOTE — Patient Instructions (Addendum)
We will need to take this complex situation in stages.   I have sent a prescription to your pharmacy, to slow the thyroid.   If ever you have fever while taking methimazole, stop it and call us, even if the reason is obvious, because of the risk of a rare side-effect.   It is best to never miss the medication.  However, if you do miss it, next best is to double up the next time.   You should approach the visual loss as a separate problem, with a an eye doctor.   I have also sent a prescription to your pharmacy, to change the cortef to fludrocortisone.   We'll recheck the other pituitary blood tests in the future.   I think you should resume immunotherapy.   Please come back for a follow-up appointment in 3 weeks.

## 2021-06-02 NOTE — Progress Notes (Signed)
Subjective:    Patient ID: George West, male    DOB: Jul 18, 1960, 61 y.o.   MRN: [id number]  HPI Wife provides history, due to pt poor overall health.  Pt is ref by Wilber Bihari, NP, for several med probs. She says pt has 5 weeks of postural dizziness.  He was noted to have hypotension, and was rx'ed cortef, with improvement in sxs.  He has never had thyroid probs before.  He has lost 10 lbs x 2 mos.  He was rx'ed cortef after 05/22/21 labs were drawn.  Pt says he was not on any steroids leading up to the 05/22/21 appointment.  He last worked 05/14/21.   Past Medical History:  Diagnosis Date   Cancer ([place])    COPD (chronic obstructive pulmonary disease) (HCC)    Depression    Dyspnea    with 27ft of ambulation. 200 feet if he walks slow.   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;  Surgeon: Stark Klein, MD;  Location: [facility];  Service: General;  Laterality: Right;   WISDOM TOOTH EXTRACTION      Social History   Socioeconomic History   Marital status: Married    Spouse name: Not on file   Number of children: 2   Years of education: Not on file   Highest education level: Not on file  Occupational History   Occupation: Public librarian    Comment: [employer]  Tobacco Use   Smoking status: Every Day    Packs/day: 0.50    Years: 40.00    Pack years: 20.00    Types: Cigarettes    Start date: 05/17/1981   Smokeless tobacco: Never  Vaping Use   Vaping Use: Never used  Substance and Sexual Activity   Alcohol use: Yes    Alcohol/week: 6.0 standard drinks    Types: 4 Cans of beer, 2 Standard drinks or equivalent per week   Drug use: No   Sexual activity: Yes    Partners: Female    Birth control/protection: None  Other Topics Concern   Not on file  Social History Narrative   Married   2  children--- 1 daughter/1 son   6 step children   Social Determinants of Health   Financial Resource Strain: Not on file  Food Insecurity: Not on file  Transportation Needs: Not on file  Physical Activity: Not on file  Stress: Not on file  Social Connections: Not on file  Intimate Partner Violence: Not on file    Current Outpatient Medications on File Prior to Visit  Medication Sig Dispense Refill   albuterol (VENTOLIN HFA) 108 (90 Base) MCG/ACT inhaler Inhale 2 puffs into the lungs every 4 (four) hours as needed for wheezing or shortness of breath. 1 each 6   Fluticasone-Umeclidin-Vilant (TRELEGY ELLIPTA) 100-62.5-25 MCG/INH AEPB Inhale 1 puff into the lungs daily. 60 each 6   prochlorperazine (COMPAZINE) 10 MG tablet Take 1 tablet (10 mg total) by mouth every 6 (six) hours as needed for nausea or vomiting. 30 tablet 0   No current facility-administered medications on file prior to visit.    Allergies  Allergen Reactions   Duloxetine     dizziness    Family History  Problem Relation Age of Onset   Stroke Mother  Dementia Mother    Breast cancer Sister    Thyroid cancer Sister    Stroke Maternal Grandmother    Breast cancer Paternal Grandmother    Thyroid disease Neg Hx     BP 120/74    Pulse 79    Ht 6' (1.829 m)    Wt 174 lb 3.2 oz (79 kg)    SpO2 94%    BMI 23.63 kg/m   Review of Systems He has fatigue and poor appetite.  Denies tremor, fever, sweating, and palpitations.      Objective:   Physical Exam VS: see vs page GEN: no distress HEAD: head: no deformity eyes: no periorbital swelling, no proptosis external nose and ears are normal NECK: 5 cm right thyroid mass is noted.   CHEST WALL: no deformity LUNGS: clear to auscultation CV: reg rate and rhythm, no murmur.  MUSCULOSKELETAL: gait is normal and steady EXTEMITIES: no deformity.  no leg edema NEURO:  readily moves all 4's.  sensation is intact to touch on all 4's.   SKIN:  Normal texture and  temperature.  No rash or suspicious lesion is visible.   NODES:  None palpable at the neck.   PSYCH: alert, well-oriented.  Does not appear anxious nor depressed.    MRI: 4 mm hypoenhancing lesion in the left aspect of the pituitary,  favored to represent a pituitary microadenoma. This was not definitively present on the 12/19/2020 exam; however comparison is limited by slice thickness    [id number]  Lab Results  Component Value Date   TSH <0.080 (L) 05/22/2021     Assessment & Plan:  Pituitary insuff: Poss related to immunotherapy.   Pit nodule, prob not related to the above.  Hyperthyroidism: poss related to immunotherapy.   Visual loss, not related to the above.    Patient Instructions  We will need to take this complex situation in stages.   I have sent a prescription to your pharmacy, to slow the thyroid.   If ever you have fever while taking methimazole, stop it and call us, even if the reason is obvious, because of the risk of a rare side-effect.   It is best to never miss the medication.  However, if you do miss it, next best is to double up the next time.   You should approach the visual loss as a separate problem, with a an eye doctor.   I have also sent a prescription to your pharmacy, to change the cortef to fludrocortisone.   We'll recheck the other pituitary blood tests in the future.   I think you should resume immunotherapy.   Please come back for a follow-up appointment in 3 weeks.

## 2021-06-03 ENCOUNTER — Telehealth: Payer: Self-pay | Admitting: *Deleted

## 2021-06-03 NOTE — Telephone Encounter (Signed)
"  George West spouse [name] [phone]).  His employer told us to call to ensure a release of information (ROI) is on file.  We have filed a short-term disability claim.  Please call to confirm."  No Group [employer] ROI noted in documents.  Advised a new ROI required with each individual request as release expires date of signature.   .    18/Jan/2023 at [place] Disability/Family and Medical Leave Act Cover Sheet with request & Authorization for Use/Disclosure of Protected  Health Information forms e-mailed to [email or web address] from [email or web address].

## 2021-06-06 DIAGNOSIS — E0590 Thyrotoxicosis, unspecified without thyrotoxic crisis or storm: Secondary | ICD-10-CM | POA: Insufficient documentation

## 2021-06-12 ENCOUNTER — Other Ambulatory Visit: Payer: Self-pay | Admitting: Hematology

## 2021-06-15 ENCOUNTER — Other Ambulatory Visit: Payer: Self-pay

## 2021-06-15 ENCOUNTER — Emergency Department (HOSPITAL_COMMUNITY): Payer: BC Managed Care – PPO

## 2021-06-15 ENCOUNTER — Inpatient Hospital Stay: Payer: BC Managed Care – PPO | Admitting: Hematology

## 2021-06-15 ENCOUNTER — Inpatient Hospital Stay: Payer: BC Managed Care – PPO

## 2021-06-15 ENCOUNTER — Encounter (HOSPITAL_COMMUNITY): Payer: Self-pay | Admitting: Emergency Medicine

## 2021-06-15 ENCOUNTER — Observation Stay (HOSPITAL_BASED_OUTPATIENT_CLINIC_OR_DEPARTMENT_OTHER)
Admission: EM | Admit: 2021-06-15 | Discharge: 2021-06-16 | Disposition: A | Discharge status: Home / Self Care | Payer: BC Managed Care – PPO | Source: Home / Self Care | Attending: Emergency Medicine | Admitting: Emergency Medicine

## 2021-06-15 DIAGNOSIS — F17210 Nicotine dependence, cigarettes, uncomplicated: Secondary | ICD-10-CM | POA: Insufficient documentation

## 2021-06-15 DIAGNOSIS — J449 Chronic obstructive pulmonary disease, unspecified: Secondary | ICD-10-CM | POA: Insufficient documentation

## 2021-06-15 DIAGNOSIS — R55 Syncope and collapse: Secondary | ICD-10-CM | POA: Diagnosis present

## 2021-06-15 DIAGNOSIS — R1111 Vomiting without nausea: Secondary | ICD-10-CM | POA: Diagnosis not present

## 2021-06-15 DIAGNOSIS — Z85820 Personal history of malignant melanoma of skin: Secondary | ICD-10-CM | POA: Insufficient documentation

## 2021-06-15 DIAGNOSIS — I6782 Cerebral ischemia: Secondary | ICD-10-CM | POA: Diagnosis not present

## 2021-06-15 DIAGNOSIS — Z79899 Other long term (current) drug therapy: Secondary | ICD-10-CM | POA: Insufficient documentation

## 2021-06-15 DIAGNOSIS — E860 Dehydration: Secondary | ICD-10-CM | POA: Diagnosis not present

## 2021-06-15 DIAGNOSIS — E2740 Unspecified adrenocortical insufficiency: Secondary | ICD-10-CM | POA: Diagnosis not present

## 2021-06-15 DIAGNOSIS — R079 Chest pain, unspecified: Secondary | ICD-10-CM | POA: Diagnosis not present

## 2021-06-15 DIAGNOSIS — N179 Acute kidney failure, unspecified: Secondary | ICD-10-CM | POA: Diagnosis present

## 2021-06-15 DIAGNOSIS — Z20822 Contact with and (suspected) exposure to covid-19: Secondary | ICD-10-CM | POA: Insufficient documentation

## 2021-06-15 DIAGNOSIS — R7989 Other specified abnormal findings of blood chemistry: Secondary | ICD-10-CM | POA: Diagnosis not present

## 2021-06-15 DIAGNOSIS — K859 Acute pancreatitis without necrosis or infection, unspecified: Secondary | ICD-10-CM | POA: Diagnosis not present

## 2021-06-15 DIAGNOSIS — I951 Orthostatic hypotension: Secondary | ICD-10-CM

## 2021-06-15 DIAGNOSIS — R110 Nausea: Secondary | ICD-10-CM | POA: Diagnosis not present

## 2021-06-15 DIAGNOSIS — Z720 Tobacco use: Secondary | ICD-10-CM | POA: Diagnosis present

## 2021-06-15 DIAGNOSIS — G939 Disorder of brain, unspecified: Secondary | ICD-10-CM | POA: Diagnosis not present

## 2021-06-15 DIAGNOSIS — I959 Hypotension, unspecified: Secondary | ICD-10-CM | POA: Diagnosis present

## 2021-06-15 HISTORY — DX: Unspecified adrenocortical insufficiency: E27.40

## 2021-06-15 LAB — TROPONIN I (HIGH SENSITIVITY)
Troponin I (High Sensitivity): 4 ng/L (ref <18)
Troponin I (High Sensitivity): 6 ng/L (ref <18)

## 2021-06-15 LAB — COMPREHENSIVE METABOLIC PANEL
ALT: 9 U/L (ref 0–44)
AST: 20 U/L (ref 15–41)
Albumin: 2.9 g/dL — ABNORMAL LOW (ref 3.5–5.0)
Alkaline Phosphatase: 92 U/L (ref 38–126)
Anion gap: 17 — ABNORMAL HIGH (ref 5–15)
BUN: 14 mg/dL (ref 8–23)
CO2: 18 mmol/L — ABNORMAL LOW (ref 22–32)
Calcium: 10.2 mg/dL (ref 8.9–10.3)
Chloride: 104 mmol/L (ref 98–111)
Creatinine, Ser: 1.46 mg/dL — ABNORMAL HIGH (ref 0.61–1.24)
GFR, Estimated: 54 mL/min — ABNORMAL LOW (ref >60)
Glucose, Bld: 97 mg/dL (ref 70–99)
Potassium: 5.1 mmol/L (ref 3.5–5.1)
Sodium: 139 mmol/L (ref 135–145)
Total Bilirubin: 1.1 mg/dL (ref 0.3–1.2)
Total Protein: 6 g/dL — ABNORMAL LOW (ref 6.5–8.1)

## 2021-06-15 LAB — CBC WITH DIFFERENTIAL/PLATELET
Abs Immature Granulocytes: 0.01 10*3/uL (ref 0.00–0.07)
Basophils Absolute: 0 10*3/uL (ref 0.0–0.1)
Basophils Relative: 1 %
Eosinophils Absolute: 0.4 10*3/uL (ref 0.0–0.5)
Eosinophils Relative: 5 %
HCT: 43 % (ref 39.0–52.0)
Hemoglobin: 13.8 g/dL (ref 13.0–17.0)
Immature Granulocytes: 0 %
Lymphocytes Relative: 21 %
Lymphs Abs: 1.7 10*3/uL (ref 0.7–4.0)
MCH: 31.2 pg (ref 26.0–34.0)
MCHC: 32.1 g/dL (ref 30.0–36.0)
MCV: 97.1 fL (ref 80.0–100.0)
Monocytes Absolute: 1 10*3/uL (ref 0.1–1.0)
Monocytes Relative: 12 %
Neutro Abs: 5 10*3/uL (ref 1.7–7.7)
Neutrophils Relative %: 61 %
Platelets: 200 10*3/uL (ref 150–400)
RBC: 4.43 MIL/uL (ref 4.22–5.81)
RDW: 12.2 % (ref 11.5–15.5)
WBC: 8.2 10*3/uL (ref 4.0–10.5)
nRBC: 0 % (ref 0.0–0.2)

## 2021-06-15 LAB — CBG MONITORING, ED: Glucose-Capillary: 121 mg/dL — ABNORMAL HIGH (ref 70–99)

## 2021-06-15 LAB — T4, FREE: Free T4: 0.97 ng/dL (ref 0.61–1.12)

## 2021-06-15 LAB — URINALYSIS, ROUTINE W REFLEX MICROSCOPIC
Bilirubin Urine: NEGATIVE
Glucose, UA: NEGATIVE mg/dL
Hgb urine dipstick: NEGATIVE
Ketones, ur: NEGATIVE mg/dL
Leukocytes,Ua: NEGATIVE
Nitrite: NEGATIVE
Protein, ur: NEGATIVE mg/dL
Specific Gravity, Urine: 1.01 (ref 1.005–1.030)
pH: 7 (ref 5.0–8.0)

## 2021-06-15 LAB — CK: Total CK: 24 U/L — ABNORMAL LOW (ref 49–397)

## 2021-06-15 LAB — RESP PANEL BY RT-PCR (FLU A&B, COVID) ARPGX2
Influenza A by PCR: NEGATIVE
Influenza B by PCR: NEGATIVE
SARS Coronavirus 2 by RT PCR: NEGATIVE

## 2021-06-15 LAB — D-DIMER, QUANTITATIVE: D-Dimer, Quant: 0.74 ug/mL-FEU — ABNORMAL HIGH (ref 0.00–0.50)

## 2021-06-15 LAB — LIPASE, BLOOD: Lipase: 114 U/L — ABNORMAL HIGH (ref 11–51)

## 2021-06-15 LAB — TSH: TSH: 0.266 u[IU]/mL — ABNORMAL LOW (ref 0.350–4.500)

## 2021-06-15 MED ORDER — LACTATED RINGERS IV SOLN: INTRAVENOUS | Status: DC

## 2021-06-15 MED ORDER — SODIUM CHLORIDE 0.9 % IV BOLUS
1000.0000 mL | Freq: Once | INTRAVENOUS | Status: AC
  Administered 2021-06-15: 1000 mL via INTRAVENOUS

## 2021-06-15 MED ORDER — ONDANSETRON HCL 4 MG/2ML IJ SOLN: 4.0000 mg | Freq: Once | INTRAMUSCULAR | Status: DC

## 2021-06-15 MED ORDER — ACETAMINOPHEN 650 MG RE SUPP: 650.0000 mg | Freq: Four times a day (QID) | RECTAL | Status: DC | PRN

## 2021-06-15 MED ORDER — ACETAMINOPHEN 325 MG PO TABS: 650.0000 mg | ORAL_TABLET | Freq: Four times a day (QID) | ORAL | Status: DC | PRN

## 2021-06-15 MED ORDER — IOHEXOL 350 MG/ML SOLN
100.0000 mL | Freq: Once | INTRAVENOUS | Status: AC | PRN
  Administered 2021-06-15: 100 mL via INTRAVENOUS

## 2021-06-15 NOTE — ED Provider Notes (Signed)
Pt signed out by Dr. Pearline Cables pending symptomatic improvement.  Pt has a hx of metastatic melanoma on immunotherapy and a new diagnosis of adrenal insufficiency and a pituitary adenoma.  He has had several episodes of orthostatic hypotension and had a syncopal event today.  He was put on florinef by his doctor and that has not seemed to help the orthostatic events.    Pt also has some mild stranding around his pancreas.  He does not have any abd pain, but that could be contributing to his hypotension.  After 3 L, pt is still orthostatic and feels like he is going to pass out with just sitting.  Additional fluids were ordered.  Pt d/w Dr. Velia Meyer ([facility]) for admission.   Isla Pence, MD 06/15/21 2120

## 2021-06-15 NOTE — ED Provider Notes (Signed)
[facility] EMERGENCY DEPARTMENT Provider Note   CSN: [id number] Arrival date & time: 06/15/21  1023     History  Chief Complaint  Patient presents with   Loss of Consciousness    George West is a 61 y.o. male.  This is a 61 y.o. 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  Patient reports he was in vehicle with his spouse, felt like he was going to pass out, felt lightheaded.  He lost consciousness briefly.  Per spouse he was diaphoretic, pale.  Was unconscious for few moments then regained consciousness.  No seizure activity, he did vomit x1 as he regained alertness.  No postictal period, no incontinence.  No headache, chest pain, palpitations, dyspnea prior to episode.  Patient reports this is been occurring frequently over the past few months, he has been seen by oncology and endocrinology.  He has been compliant with his steroids, thyroid medications.  Per spouse patient's blood pressure has been variable at home ranging from 26Z to 124 systolic.  Significantly reduced oral intake over the past 2 months, unintentional weight loss.  Poor appetite.  No change in bowel or bladder function.  No rashes.  No numbness or tingling.  No fevers or chills.  No sick contacts or recent travel.    Past Medical History: No date: Cancer ([place]) No date: COPD (chronic obstructive pulmonary disease) (HCC) No date: Depression No date: Dyspnea     Comment:  with 71ft of ambulation. 200 feet if he walks slow. No date: Fatty liver No date: History of kidney stones     Comment:  seen on CT Scan  Past Surgical History: 12/15/2020: MASS EXCISION; Right     Comment:  Procedure: EXCISION OF MALIGNANT MASS RIGHT POSTERIOR               SHOULDER;  Surgeon: Stark Klein, MD;  Location: [facility];               Service:  General;  Laterality: Right; No date: WISDOM TOOTH EXTRACTION    The history is provided by the patient and the spouse. No language interpreter was used.  Loss of Consciousness Associated symptoms: vomiting   Associated symptoms: no chest pain, no confusion, no fever, no headaches, no nausea, no palpitations and no shortness of breath       Home Medications Prior to Admission medications   Medication Sig Start Date End Date Taking? Authorizing Provider  fludrocortisone (FLORINEF) 0.1 MG tablet Take 1 tablet (0.1 mg total) by mouth daily. 06/02/21  Yes Renato Shin, MD  Fluticasone-Umeclidin-Vilant (TRELEGY ELLIPTA) 100-62.5-25 MCG/INH AEPB Inhale 1 puff into the lungs daily. 06/16/20  Yes Olalere, Adewale A, MD  methimazole (TAPAZOLE) 10 MG tablet Take 2 tablets (20 mg total) by mouth 2 (two) times daily. 06/02/21  Yes Renato Shin, MD  polyvinyl alcohol (LIQUIFILM TEARS) 1.4 % ophthalmic solution Place 1 drop into both eyes as needed for dry eyes.   Yes [provider]  prochlorperazine (COMPAZINE) 10 MG tablet Take 1 tablet (10 mg total) by mouth every 6 (six) hours as needed for nausea or vomiting. Patient not taking: Reported on 06/15/2021 12/24/20   Wyatt Portela, MD      Allergies    Duloxetine    Review of Systems   Review of Systems  Constitutional:  Positive for appetite change. Negative for chills  and fever.  HENT:  Negative for facial swelling and trouble swallowing.   Eyes:  Negative for photophobia and visual disturbance.  Respiratory:  Negative for cough and shortness of breath.   Cardiovascular:  Positive for syncope. Negative for chest pain and palpitations.  Gastrointestinal:  Positive for vomiting. Negative for abdominal pain and nausea.  Endocrine: Negative for polydipsia and polyuria.  Genitourinary:  Negative for difficulty urinating and hematuria.  Musculoskeletal:  Negative for gait problem and joint swelling.  Skin:  Negative for pallor and rash.   Neurological:  Positive for syncope and light-headedness. Negative for headaches.  Psychiatric/Behavioral:  Negative for agitation and confusion.    Physical Exam Updated Vital Signs BP (!) 102/52    Pulse 66    Temp 98.1 F (36.7 C) (Oral)    Resp 13    Ht 6' (1.829 m)    Wt 77.1 kg    SpO2 93%    BMI 23.06 kg/m  Physical Exam Vitals and nursing note reviewed.  Constitutional:      General: He is not in acute distress.    Appearance: Normal appearance. He is well-developed. He is not ill-appearing, toxic-appearing or diaphoretic.  HENT:     Head: Normocephalic and atraumatic.     Right Ear: External ear normal.     Left Ear: External ear normal.     Mouth/Throat:     Mouth: Mucous membranes are moist.  Eyes:     General: No scleral icterus.    Extraocular Movements: Extraocular movements intact.     Pupils: Pupils are equal, round, and reactive to light.  Cardiovascular:     Rate and Rhythm: Normal rate and regular rhythm.     Pulses: Normal pulses.     Heart sounds: Normal heart sounds.  Pulmonary:     Effort: Pulmonary effort is normal. No respiratory distress.     Breath sounds: Normal breath sounds.  Abdominal:     General: Abdomen is flat. There is no distension.     Palpations: Abdomen is soft.     Tenderness: There is no abdominal tenderness.  Musculoskeletal:        General: Normal range of motion.     Cervical back: Full passive range of motion without pain and normal range of motion.     Right lower leg: No edema.     Left lower leg: No edema.  Skin:    General: Skin is warm and dry.     Capillary Refill: Capillary refill takes less than 2 seconds.  Neurological:     Mental Status: He is alert and oriented to person, place, and time. Mental status is at baseline.     GCS: GCS eye subscore is 4. GCS verbal subscore is 5. GCS motor subscore is 6.     Cranial Nerves: Cranial nerves 2-12 are intact. No dysarthria or facial asymmetry.     Sensory: Sensation is  intact.     Motor: Motor function is intact. No tremor.     Coordination: Coordination is intact.     Comments: Baseline per spouse  Psychiatric:        Mood and Affect: Mood normal.        Behavior: Behavior normal.    ED Results / Procedures / Treatments   Labs (all labs ordered are listed, but only abnormal results are displayed) Labs Reviewed  COMPREHENSIVE METABOLIC PANEL - Abnormal; Notable for the following components:      Result Value   CO2  18 (*)    Creatinine, Ser 1.46 (*)    Total Protein 6.0 (*)    Albumin 2.9 (*)    GFR, Estimated 54 (*)    Anion gap 17 (*)    All other components within normal limits  D-DIMER, QUANTITATIVE - Abnormal; Notable for the following components:   D-Dimer, Quant 0.74 (*)    All other components within normal limits  TSH - Abnormal; Notable for the following components:   TSH 0.266 (*)    All other components within normal limits  CK - Abnormal; Notable for the following components:   Total CK 24 (*)    All other components within normal limits  LIPASE, BLOOD - Abnormal; Notable for the following components:   Lipase 114 (*)    All other components within normal limits  CBG MONITORING, ED - Abnormal; Notable for the following components:   Glucose-Capillary 121 (*)    All other components within normal limits  CBC WITH DIFFERENTIAL/PLATELET  URINALYSIS, ROUTINE W REFLEX MICROSCOPIC  T4, FREE  TROPONIN I (HIGH SENSITIVITY)  TROPONIN I (HIGH SENSITIVITY)    EKG EKG Interpretation  Date/Time:  Monday June 15 2021 10:50:26 EST Ventricular Rate:  68 PR Interval:  165 QRS Duration: 85 QT Interval:  407 QTC Calculation: 433 R Axis:   73 Text Interpretation: Sinus rhythm Borderline low voltage, extremity leads similar to prior Confirmed by Wynona Dove ([id number]) on 06/15/2021 10:55:29 AM  Radiology CT Head Wo Contrast  Result Date: 06/15/2021 CLINICAL DATA:  Syncope/presyncope, cerebrovascular cause suspected. 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 EXAM: CT HEAD WITHOUT CONTRAST TECHNIQUE: Contiguous axial images were obtained from the base of the skull through the vertex without intravenous contrast. RADIATION DOSE REDUCTION: This exam was performed according to the departmental dose-optimization program which includes automated exposure control, adjustment of the mA and/or kV according to patient size and/or use of iterative reconstruction technique. COMPARISON:  Brain MRI 05/14/2021.  Brain MRI 12/19/2020. FINDINGS: Brain: Cerebral volume is normal. Mild patchy and ill-defined hypoattenuation within the cerebral white matter, nonspecific but compatible with chronic small vessel ischemic disease. The subcentimeter lesion within the pituitary gland described on the prior brain MRI of 05/14/2021 is occult by CT. There is no acute infarct. No chronic intracranial blood products. No extra-axial fluid collection. No midline shift. Vascular: No hyperdense vessel.  Atherosclerotic calcifications. Skull: Normal. Negative for fracture or focal lesion. Sinuses/Orbits: Visualized orbits show no acute finding. No significant paranasal sinus disease at the imaged levels. IMPRESSION: No evidence of acute intracranial hemorrhage or acute infarct. The 4 mm pituitary gland lesion described on the prior brain MRI of 05/14/2021 is occult by CT. Mild chronic small vessel ischemic changes within the cerebral white matter. Please note a brain MRI with contrast would have greater sensitivity for intracranial metastatic disease. Electronically Signed   By: Kellie Simmering D.O.   On: 06/15/2021 12:18   CT Angio Chest PE W and/or Wo Contrast  Result Date: 06/15/2021 CLINICAL DATA:  Chest pain, positive D-dimer EXAM: CT ANGIOGRAPHY CHEST WITH CONTRAST TECHNIQUE: Multidetector CT imaging of the chest was performed using the standard protocol during bolus administration of intravenous contrast. Multiplanar CT image  reconstructions and MIPs were obtained to evaluate the vascular anatomy. RADIATION DOSE REDUCTION: This exam was performed according to the departmental dose-optimization program which includes automated exposure control, adjustment of the mA and/or kV according to patient size and/or use of iterative reconstruction technique. CONTRAST:  124mL OMNIPAQUE  IOHEXOL 350 MG/ML SOLN COMPARISON:  Previous CT done on 06/23/2020, chest radiograph done on 06/15/2021 FINDINGS: Cardiovascular: Contrast density in the thoracic aorta is less than adequate to evaluate for dissection. There are no intraluminal filling defects in pulmonary artery branches. Mediastinum/Nodes: Slightly enlarged lymph nodes are seen in the mediastinum and hilar regions. Lungs/Pleura: There is no focal pulmonary consolidation. There are no signs of pulmonary edema. Slightly increased interstitial markings are seen in the subpleural location in the posterior lower lung fields. There is no pleural effusion or pneumothorax. Upper Abdomen: There are foci of cortical thinning in the right kidney suggesting scarring from chronic pyelonephritis. There is minimal stranding in the fat planes adjacent to the body and tail of pancreas without any loculated fluid collections. Musculoskeletal: Unremarkable. Review of the MIP images confirms the above findings. IMPRESSION: There is no evidence of pulmonary artery embolism. There is no focal pulmonary consolidation. Subtle increased interstitial markings are seen in the posterior lower lung fields suggesting scarring or interstitial pneumonitis. There is mild stranding in the fat planes adjacent to body and tail of pancreas. This may be residual scarring from previous inflammation or suggest mild acute pancreatitis. Please correlate with laboratory findings. There are multiple foci of cortical thinning in the right kidney suggesting scarring from chronic pyelonephritis. Electronically Signed   By: Elmer Picker  M.D.   On: 06/15/2021 16:16   DG Chest Portable 1 View  Result Date: 06/15/2021 CLINICAL DATA:  Loss of consciousness. EXAM: PORTABLE CHEST 1 VIEW COMPARISON:  02/23/2020 FINDINGS: The heart size and mediastinal contours are within normal limits. Both lungs are clear. The visualized skeletal structures are unremarkable. IMPRESSION: No active disease. Electronically Signed   By: Kerby Moors M.D.   On: 06/15/2021 11:09    Procedures .Critical Care Performed by: Jeanell Sparrow, DO Authorized by: Jeanell Sparrow, DO   Critical care provider statement:    Critical care time (minutes):  30   Critical care time was exclusive of:  Separately billable procedures and treating other patients   Critical care was necessary to treat or prevent imminent or life-threatening deterioration of the following conditions:  Dehydration   Critical care was time spent personally by me on the following activities:  Development of treatment plan with patient or surrogate, discussions with consultants, evaluation of patient's response to treatment, examination of patient, ordering and review of laboratory studies, ordering and review of radiographic studies, ordering and performing treatments and interventions, pulse oximetry, re-evaluation of patient's condition, review of old charts and obtaining history from patient or surrogate    Medications Ordered in ED Medications  ondansetron (ZOFRAN) injection 4 mg (4 mg Intravenous Patient Refused/Not Given 06/15/21 1125)  sodium chloride 0.9 % bolus 1,000 mL (0 mLs Intravenous Stopped 06/15/21 1338)  sodium chloride 0.9 % bolus 1,000 mL (0 mLs Intravenous Stopped 06/15/21 1610)  iohexol (OMNIPAQUE) 350 MG/ML injection 100 mL (100 mLs Intravenous Contrast Given 06/15/21 1541)    ED Course/ Medical Decision Making/ A&P Clinical Course as of 06/15/21 1727  Mon Jun 15, 2021  1726 Lipase(!): 114 Minimally elevated, nontender to midepigastrium on exam, low suspicion for acute  pancreatitis [SG]    Clinical Course User Index [SG] Jeanell Sparrow, DO                           Medical Decision Making Amount and/or Complexity of Data Reviewed Labs: ordered. Radiology: ordered.  Risk Prescription drug management.  CC: Syncope  This patient presents to the Emergency Department for the above complaint. This involves an extensive number of treatment options and is a complaint that carries with it a high risk of complications and morbidity. Vital signs were reviewed. Serious etiologies considered.  Record review:  Previous records obtained and reviewed   Additional history obtained from spouse  Medical and surgical history as noted above.   Work up as above, notable for:  Labs & imaging results that were available during my care of the patient were reviewed by me and considered in my medical decision making.   Patient with pituitary adenoma, metastatic melanoma, evaluate with CT head to ensure no new intracranial process  I ordered imaging studies which included CT head, chest x-ray and I independently visualized and interpreted imaging which showed previously identified pituitary lesion. Otherwise no change. CTPE with questionable stranding to pancreas/renal system. No abd pain on exam, no recent ETOH use. Low suspicion for acute pancreatitis. UA is pending.   Cardiac monitoring reviewed and interpreted personally which shows normal sinus rhythm  Social determinants of health include - N/a  His renal function is mildly worse than baseline, today is 1.56, baseline is around 1-1.2. Given IVF and also tolerating PO. Albumin is reduced- pt with poor caloric intake.    Management: IVF  Reassessment:  Pt reports that he "feels fine." Discussed findings and lab results from today. Favor recurrent syncope- likely orthostatic, secondary to multiple factors including severely limited po intake, adrenal and thyroid abnormalities. Thyroid screening tests today  were stable. Advised him to continue home medications and to attempt to increase his caloric intake if possible. Follow up with endocrine and with PCP.   I discussed this at length with the patient and spouse at bedside.  Pending UA at this time.  Signed out to incoming EDP at this time pending UA, anticipate discharge once this has resulted.        This chart was dictated using voice recognition software.  Despite best efforts to proofread,  errors can occur which can change the documentation meaning.         Final Clinical Impression(s) / ED Diagnoses Final diagnoses:  Syncope, unspecified syncope type  Orthostatic hypotension  Mild dehydration    Rx / DC Orders ED Discharge Orders     None         Jeanell Sparrow, DO 06/15/21 1725

## 2021-06-15 NOTE — ED Triage Notes (Signed)
Pt BIB EMS due to [place] by vomiting. Pt had this happen to him 3 weeks ago and was diagnosed with unspecified hypotension. Pt has stage 4 melanoma and COPD. Pt states this is happening more frequently. Pt is axox4. VSS. Pt received 450 of NS via EMS.

## 2021-06-16 ENCOUNTER — Observation Stay (HOSPITAL_BASED_OUTPATIENT_CLINIC_OR_DEPARTMENT_OTHER): Payer: BC Managed Care – PPO

## 2021-06-16 ENCOUNTER — Encounter (HOSPITAL_COMMUNITY): Payer: Self-pay | Admitting: Internal Medicine

## 2021-06-16 DIAGNOSIS — R55 Syncope and collapse: Secondary | ICD-10-CM

## 2021-06-16 DIAGNOSIS — I959 Hypotension, unspecified: Secondary | ICD-10-CM | POA: Diagnosis present

## 2021-06-16 DIAGNOSIS — I951 Orthostatic hypotension: Secondary | ICD-10-CM | POA: Diagnosis present

## 2021-06-16 DIAGNOSIS — N179 Acute kidney failure, unspecified: Secondary | ICD-10-CM | POA: Diagnosis present

## 2021-06-16 DIAGNOSIS — E2740 Unspecified adrenocortical insufficiency: Secondary | ICD-10-CM | POA: Diagnosis present

## 2021-06-16 LAB — COMPREHENSIVE METABOLIC PANEL
ALT: 9 U/L (ref 0–44)
AST: 15 U/L (ref 15–41)
Albumin: 2.6 g/dL — ABNORMAL LOW (ref 3.5–5.0)
Alkaline Phosphatase: 76 U/L (ref 38–126)
Anion gap: 8 (ref 5–15)
BUN: 11 mg/dL (ref 8–23)
CO2: 20 mmol/L — ABNORMAL LOW (ref 22–32)
Calcium: 9.1 mg/dL (ref 8.9–10.3)
Chloride: 109 mmol/L (ref 98–111)
Creatinine, Ser: 1.26 mg/dL — ABNORMAL HIGH (ref 0.61–1.24)
GFR, Estimated: >60 mL/min (ref >60)
Glucose, Bld: 94 mg/dL (ref 70–99)
Potassium: 4.2 mmol/L (ref 3.5–5.1)
Sodium: 137 mmol/L (ref 135–145)
Total Bilirubin: 0.7 mg/dL (ref 0.3–1.2)
Total Protein: 5.4 g/dL — ABNORMAL LOW (ref 6.5–8.1)

## 2021-06-16 LAB — ECHOCARDIOGRAM COMPLETE
AR max vel: 3.11 cm2
AV Peak grad: 7.1 mmHg
Ao pk vel: 1.34 m/s
Area-P 1/2: 3.48 cm2
Height: 72 in
S' Lateral: 2.9 cm
Weight: 2720 oz

## 2021-06-16 LAB — CBC WITH DIFFERENTIAL/PLATELET
Abs Immature Granulocytes: 0.01 10*3/uL (ref 0.00–0.07)
Basophils Absolute: 0 10*3/uL (ref 0.0–0.1)
Basophils Relative: 1 %
Eosinophils Absolute: 0.4 10*3/uL (ref 0.0–0.5)
Eosinophils Relative: 7 %
HCT: 36.7 % — ABNORMAL LOW (ref 39.0–52.0)
Hemoglobin: 11.6 g/dL — ABNORMAL LOW (ref 13.0–17.0)
Immature Granulocytes: 0 %
Lymphocytes Relative: 33 %
Lymphs Abs: 1.9 10*3/uL (ref 0.7–4.0)
MCH: 31.6 pg (ref 26.0–34.0)
MCHC: 31.6 g/dL (ref 30.0–36.0)
MCV: 100 fL (ref 80.0–100.0)
Monocytes Absolute: 0.6 10*3/uL (ref 0.1–1.0)
Monocytes Relative: 11 %
Neutro Abs: 2.8 10*3/uL (ref 1.7–7.7)
Neutrophils Relative %: 48 %
Platelets: 189 10*3/uL (ref 150–400)
RBC: 3.67 MIL/uL — ABNORMAL LOW (ref 4.22–5.81)
RDW: 12.2 % (ref 11.5–15.5)
WBC: 5.8 10*3/uL (ref 4.0–10.5)
nRBC: 0 % (ref 0.0–0.2)

## 2021-06-16 LAB — SODIUM, URINE, RANDOM: Sodium, Ur: 42 mmol/L

## 2021-06-16 LAB — CREATININE, URINE, RANDOM: Creatinine, Urine: 139.63 mg/dL

## 2021-06-16 LAB — MAGNESIUM: Magnesium: 1.6 mg/dL — ABNORMAL LOW (ref 1.7–2.4)

## 2021-06-16 LAB — HIV ANTIBODY (ROUTINE TESTING W REFLEX): HIV Screen 4th Generation wRfx: NONREACTIVE

## 2021-06-16 LAB — TROPONIN I (HIGH SENSITIVITY): Troponin I (High Sensitivity): 6 ng/L (ref <18)

## 2021-06-16 LAB — PHOSPHORUS: Phosphorus: 3.6 mg/dL (ref 2.5–4.6)

## 2021-06-16 MED ORDER — NICOTINE 14 MG/24HR TD PT24
14.0000 mg | MEDICATED_PATCH | Freq: Every day | TRANSDERMAL | Status: DC | PRN
  Filled 2021-06-16: qty 1

## 2021-06-16 MED ORDER — METHIMAZOLE 10 MG PO TABS
20.0000 mg | ORAL_TABLET | Freq: Two times a day (BID) | ORAL | Status: DC
  Administered 2021-06-16: 20 mg via ORAL
  Filled 2021-06-16 (×2): qty 2

## 2021-06-16 MED ORDER — FLUTICASONE FUROATE-VILANTEROL 100-25 MCG/ACT IN AEPB
1.0000 | INHALATION_SPRAY | Freq: Every day | RESPIRATORY_TRACT | Status: DC
  Administered 2021-06-16: 1 via RESPIRATORY_TRACT
  Filled 2021-06-16: qty 28

## 2021-06-16 MED ORDER — MAGNESIUM SULFATE IN D5W 1-5 GM/100ML-% IV SOLN
1.0000 g | Freq: Once | INTRAVENOUS | Status: AC
  Administered 2021-06-16: 1 g via INTRAVENOUS
  Filled 2021-06-16: qty 100

## 2021-06-16 MED ORDER — FLUDROCORTISONE ACETATE 0.1 MG PO TABS: 0.1000 mg | ORAL_TABLET | Freq: Two times a day (BID) | ORAL | 0 refills | Status: DC

## 2021-06-16 MED ORDER — FLUDROCORTISONE ACETATE 0.1 MG PO TABS
0.1000 mg | ORAL_TABLET | Freq: Every day | ORAL | Status: DC
  Administered 2021-06-16: 0.1 mg via ORAL
  Filled 2021-06-16: qty 1

## 2021-06-16 MED ORDER — UMECLIDINIUM BROMIDE 62.5 MCG/ACT IN AEPB
1.0000 | INHALATION_SPRAY | Freq: Every day | RESPIRATORY_TRACT | Status: DC
  Administered 2021-06-16: 1 via RESPIRATORY_TRACT
  Filled 2021-06-16: qty 7

## 2021-06-16 NOTE — Discharge Summary (Signed)
Physician Discharge Summary  Patient ID: George West MRN: [id number] DOB/AGE: 1961/01/03 61 y.o.  Admit date: 06/15/2021 Discharge date: 06/16/2021  Admission Diagnoses:  Discharge Diagnoses:  Principal Problem:   Syncope Active Problems:   Tobacco abuse   COPD (chronic obstructive pulmonary disease) (HCC)   Orthostatic hypotension   AKI (acute kidney injury) ([place])   Hypercalcemia   Adrenal insufficiency ([place])   Discharged Condition: stable  Hospital Course: HPI: George West is a 61 y.o. male with medical history significant for adrenal insufficiency, metastatic melanoma, COPD, chronic tobacco abuse who is admitted to [facility] on 06/15/2021 with syncope in the setting of orthostatic hypotension after presenting from home to [facility] ED complaining of syncope.    The patient reports a single episode of syncope that occurred as he was rising from a seated to a standing position while at home earlier today, resulting in a fall to the ground although he does not believe that he hit his head as a component of the associated fall.  He reports that the episode of syncope was associated with preceding sensation of dizziness, lightheadedness, as well as the subjective sensation of impending loss of consciousness leading up to the episode of syncope.  Episode was witnessed by the patient's wife, who was able to confirm that this episode was not associated with any generalized tonic-clonic activity.  Patient conveys that this episode of syncope was not associated with any tongue biting or any loss of bowel/bladder function.   As it relates to syncope, the patient denies any recent, immediately preceding, or ensuing chest pain, sob, palpitations, diaphoresis, nausea, vomiting dizziness.  Denies any prior history of syncopal event.     The patient confirms that he was recently diagnosed with adrenal insufficiency and started on fludrocortisone, with which he reports good ensuing compliance.   Denies any recent subjective fever, chills, rigors, or generalized myalgias.  Patient was given IV fluids.  At the time of discharge he was ambulating to and from bathroom without any syncopal episodes.  I discussed with his endocrinologist Dr. Loanne Drilling who advised to double his Florinef.  He also has an appointment with Dr. Loanne Drilling in the next 5 days that is already scheduled.  He had a cardiac echo which was normal.  He was no longer orthostatic upon discharge.  Patient is being discharged home with appropriate outpatient follow-up.  He is to follow-up with his primary care physician in 1 to 2 weeks.  He is to keep his appointment with Dr. Loanne Drilling in 5 days.       ED Course:  Vital signs in the ED were notable for the following: Afebrile; heart rate 64-73; blood pressure 93/65 initially, with ensuing increased to 120/78 following interval administration of IV fluids, as further quantified below; respiratory rate 15-21, and oxygen saturation 95 to 99% on room air.   Labs were notable for the following: CMP was notable for the following: Sodium 139, creatinine 1.46 relative to most recent prior value of 1.11 on 05/22/2021, glucose 97.  Calcium, corrected for mild hypoalbuminemia 11.0.  CBC notable for white blood cell count 8200, hemoglobin 13.8.  High-sensitivity troponin I initially noted to be 4, with repeat value trending up slightly to 6.  D-dimer 0.75.  Urinalysis showed no white blood cells, no red blood cells, was negative for protein.  COVID-19/influenza PCR were negative.   Imaging and additional notable ED work-up: EKG showed sinus rhythm with heart rate 68, normal intervals, and no evidence of T wave  or ST changes, including no evidence of ST elevation.  Chest x-ray shows no evidence of acute cardiopulmonary process, including no evidence of infiltrate, edema, effusion, or pneumothorax.  CT head shows no evidence of acute intracranial process, including evidence of intracranial emergency or any  evidence of acute ischemic infarct.  In the setting of underlying malignancy, syncope, and elevated D-dimer, CTA chest was pursued, and showed no evidence of acute pulmonary embolism.   Orthostatic vital signs were reportedly checked in the ED this evening, and were reportedly suggestive of orthostatic hypotension.   While in the ED, the following were administered: Normal saline x3.5 L bolus.   Subsequently, the patient was admitted for overnight observation for further evaluation and management of presenting syncope, suspected to be on the basis of orthostatic hypotension, and complicated by acute kidney injury as well as hypercalcemia, felt to represent underlying mild dehydration.       Discharge Exam: Blood pressure 124/74, pulse 74, temperature 98.3 F (36.8 C), temperature source Oral, resp. rate (!) 23, height 6' (1.829 m), weight 77.1 kg, SpO2 92 %. General appearance: alert and cooperative Head: Normocephalic, without obvious abnormality, atraumatic Resp: clear to auscultation bilaterally Cardio: regular rate and rhythm, S1, S2 normal, no murmur, click, rub or gallop GI: soft, non-tender; bowel sounds normal; no masses,  no organomegaly Extremities: extremities normal, atraumatic, no cyanosis or edema Pulses: 2+ and symmetric Skin: Skin color, texture, turgor normal. No rashes or lesions  Disposition: Discharge disposition: 01-Home or Self Care       Discharge Instructions     Diet - low sodium heart healthy   Complete by: As directed    Discharge instructions   Complete by: As directed    Follow-up with primary care physician in 1 to 2 weeks  Follow-up with Dr. Loanne Drilling in 5 days as already scheduled  Your cardiac echo of your heart is normal  I have spoken to Dr. Loanne Drilling recommending to increase your Florinef to twice a day   Increase activity slowly   Complete by: As directed       Allergies as of 06/16/2021       Reactions   Duloxetine    dizziness         Medication List     TAKE these medications    fludrocortisone 0.1 MG tablet Commonly known as: FLORINEF Take 1 tablet (0.1 mg total) by mouth 2 (two) times daily. What changed: when to take this   methimazole 10 MG tablet Commonly known as: TAPAZOLE Take 2 tablets (20 mg total) by mouth 2 (two) times daily.   polyvinyl alcohol 1.4 % ophthalmic solution Commonly known as: LIQUIFILM TEARS Place 1 drop into both eyes as needed for dry eyes.   prochlorperazine 10 MG tablet Commonly known as: COMPAZINE Take 1 tablet (10 mg total) by mouth every 6 (six) hours as needed for nausea or vomiting.   Trelegy Ellipta 100-62.5-25 MCG/ACT Aepb Generic drug: Fluticasone-Umeclidin-Vilant Inhale 1 puff into the lungs daily.         Signed: Brycin Kille A 06/16/2021, 11:39 AM

## 2021-06-16 NOTE — ED Notes (Signed)
Breakfast orders placed 

## 2021-06-16 NOTE — ED Notes (Signed)
Pt wife very upset about pt being d/c without hypotension and dizziness being either stopped or a clear cut reason to what the cause is. Dr. Shanon Brow aware. This RN encouraged pt to move slowly and to only do activity that can be tolerated. Pt denied dizziness to this RN during care from 0700 to d/c. Pt color was appropriate at d/c. Discharge instructions covered and pt verbalized understanding. IV removed and bleeding controlled. Pt wheeled to exit and assisted into car by this RN. Pt continuously verbalized understanding of his health.

## 2021-06-16 NOTE — ED Notes (Signed)
Attempted report X1

## 2021-06-16 NOTE — H&P (Signed)
History and Physical    PLEASE NOTE THAT DRAGON DICTATION SOFTWARE WAS USED IN THE CONSTRUCTION OF THIS NOTE.   George West JSE:[id number] DOB: 06/10/1960 DOA: 06/15/2021  PCP: [name], MD  Patient coming from: home   I have personally briefly reviewed patient's old medical records in [place]  Chief Complaint: Syncope  HPI: George West is a 61 y.o. male with medical history significant for adrenal insufficiency, metastatic melanoma, COPD, chronic tobacco abuse who is admitted to [facility] on 06/15/2021 with syncope in the setting of orthostatic hypotension after presenting from home to [facility] ED complaining of syncope.   The patient reports a single episode of syncope that occurred as he was rising from a seated to a standing position while at home earlier today, resulting in a fall to the ground although he does not believe that he hit his head as a component of the associated fall.  He reports that the episode of syncope was associated with preceding sensation of dizziness, lightheadedness, as well as the subjective sensation of impending loss of consciousness leading up to the episode of syncope.  Episode was witnessed by the patient's wife, who was able to confirm that this episode was not associated with any generalized tonic-clonic activity.  Patient conveys that this episode of syncope was not associated with any tongue biting or any loss of bowel/bladder function.  As it relates to syncope, the patient denies any recent, immediately preceding, or ensuing chest pain, sob, palpitations, diaphoresis, nausea, vomiting dizziness.  Denies any prior history of syncopal event.    The patient confirms that he was recently diagnosed with adrenal insufficiency and started on fludrocortisone, with which he reports good ensuing compliance.  Denies any recent subjective fever, chills, rigors, or generalized myalgias.     ED Course:  Vital signs in the ED were  notable for the following: Afebrile; heart rate 64-73; blood pressure 93/65 initially, with ensuing increased to 120/78 following interval administration of IV fluids, as further quantified below; respiratory rate 15-21, and oxygen saturation 95 to 99% on room air.  Labs were notable for the following: CMP was notable for the following: Sodium 139, creatinine 1.46 relative to most recent prior value of 1.11 on 05/22/2021, glucose 97.  Calcium, corrected for mild hypoalbuminemia 11.0.  CBC notable for white blood cell count 8200, hemoglobin 13.8.  High-sensitivity troponin I initially noted to be 4, with repeat value trending up slightly to 6.  D-dimer 0.75.  Urinalysis showed no white blood cells, no red blood cells, was negative for protein.  COVID-19/influenza PCR were negative.  Imaging and additional notable ED work-up: EKG showed sinus rhythm with heart rate 68, normal intervals, and no evidence of T wave or ST changes, including no evidence of ST elevation.  Chest x-ray shows no evidence of acute cardiopulmonary process, including no evidence of infiltrate, edema, effusion, or pneumothorax.  CT head shows no evidence of acute intracranial process, including evidence of intracranial emergency or any evidence of acute ischemic infarct.  In the setting of underlying malignancy, syncope, and elevated D-dimer, CTA chest was pursued, and showed no evidence of acute pulmonary embolism.  Orthostatic vital signs were reportedly checked in the ED this evening, and were reportedly suggestive of orthostatic hypotension.  While in the ED, the following were administered: Normal saline x3.5 L bolus.  Subsequently, the patient was admitted for overnight observation for further evaluation and management of presenting syncope, suspected to be on the basis of orthostatic  hypotension, and complicated by acute kidney injury as well as hypercalcemia, felt to represent underlying mild dehydration.     Review of  Systems: As per HPI otherwise 10 point review of systems negative.   Past Medical History:  Diagnosis Date   Adrenal insufficiency (HCC)    Cancer (HCC)    COPD (chronic obstructive pulmonary disease) (HCC)    Depression    Dyspnea    with 56ft of ambulation. 200 feet if he walks slow.   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;  Surgeon: Stark Klein, MD;  Location: [facility];  Service: General;  Laterality: Right;   WISDOM TOOTH EXTRACTION      Social History:  reports that he has been smoking cigarettes. He started smoking about 40 years ago. He has a 20.00 pack-year smoking history. He has never used smokeless tobacco. He reports current alcohol use of about 6.0 standard drinks per week. He reports that he does not use drugs.   Allergies  Allergen Reactions   Duloxetine     dizziness    Family History  Problem Relation Age of Onset   Stroke Mother    Dementia Mother    Breast cancer Sister    Thyroid cancer Sister    Stroke Maternal Grandmother    Breast cancer Paternal Grandmother    Thyroid disease Neg Hx     Family history reviewed and not pertinent    Prior to Admission medications   Medication Sig Start Date End Date Taking? Authorizing Provider  fludrocortisone (FLORINEF) 0.1 MG tablet Take 1 tablet (0.1 mg total) by mouth daily. 06/02/21  Yes Renato Shin, MD  Fluticasone-Umeclidin-Vilant (TRELEGY ELLIPTA) 100-62.5-25 MCG/INH AEPB Inhale 1 puff into the lungs daily. 06/16/20  Yes Olalere, Adewale A, MD  methimazole (TAPAZOLE) 10 MG tablet Take 2 tablets (20 mg total) by mouth 2 (two) times daily. 06/02/21  Yes Renato Shin, MD  polyvinyl alcohol (LIQUIFILM TEARS) 1.4 % ophthalmic solution Place 1 drop into both eyes as needed for dry eyes.   Yes [provider]  prochlorperazine  (COMPAZINE) 10 MG tablet Take 1 tablet (10 mg total) by mouth every 6 (six) hours as needed for nausea or vomiting. Patient not taking: Reported on 06/15/2021 12/24/20   Wyatt Portela, MD     Objective    Physical Exam: Vitals:   06/16/21 0200 06/16/21 0230 06/16/21 0300 06/16/21 0330  BP: 121/68 121/70 118/70 112/66  Pulse: 75 62 67 69  Resp: (!) 22 19 15 16   Temp:      TempSrc:      SpO2: 99% 92%  90%  Weight:      Height:        General: appears to be stated age; alert, oriented Skin: warm, dry, no rash Head:  AT/[place] Mouth:  Oral mucosa membranes appear dry, normal dentition Neck: supple; trachea midline Heart:  RRR; did not appreciate any M/R/G Lungs: CTAB, did not appreciate any wheezes, rales, or rhonchi Abdomen: + BS; soft, ND, NT Vascular: 2+ pedal pulses b/l; 2+ radial pulses b/l Extremities: no peripheral edema, no muscle wasting Neuro: strength and sensation intact in upper and lower extremities b/l    Labs on Admission: I have personally reviewed following labs and imaging studies  CBC: Recent Labs  Lab  06/15/21 1118  WBC 8.2  NEUTROABS 5.0  HGB 13.8  HCT 43.0  MCV 97.1  PLT 828   Basic Metabolic Panel: Recent Labs  Lab 06/15/21 1118  NA 139  K 5.1  CL 104  CO2 18*  GLUCOSE 97  BUN 14  CREATININE 1.46*  CALCIUM 10.2   GFR: Estimated Creatinine Clearance: 57.9 mL/min (A) (by C-G formula based on SCr of 1.46 mg/dL (H)). Liver Function Tests: Recent Labs  Lab 06/15/21 1118  AST 20  ALT 9  ALKPHOS 92  BILITOT 1.1  PROT 6.0*  ALBUMIN 2.9*   Recent Labs  Lab 06/15/21 1118  LIPASE 114*   No results for input(s): AMMONIA in the last 168 hours. Coagulation Profile: No results for input(s): INR, PROTIME in the last 168 hours. Cardiac Enzymes: Recent Labs  Lab 06/15/21 1118  CKTOTAL 24*   BNP (last 3 results) No results for input(s): PROBNP in the last 8760 hours. HbA1C: No results for input(s): HGBA1C in the last 72  hours. CBG: Recent Labs  Lab 06/15/21 1117  GLUCAP 121*   Lipid Profile: No results for input(s): CHOL, HDL, LDLCALC, TRIG, CHOLHDL, LDLDIRECT in the last 72 hours. Thyroid Function Tests: Recent Labs    06/15/21 1118  TSH 0.266*  FREET4 0.97   Anemia Panel: No results for input(s): VITAMINB12, FOLATE, FERRITIN, TIBC, IRON, RETICCTPCT in the last 72 hours. Urine analysis:    Component Value Date/Time   COLORURINE YELLOW 06/15/2021 1659   APPEARANCEUR CLEAR 06/15/2021 1659   LABSPEC 1.010 06/15/2021 1659   PHURINE 7.0 06/15/2021 1659   GLUCOSEU NEGATIVE 06/15/2021 1659   HGBUR NEGATIVE 06/15/2021 1659   BILIRUBINUR NEGATIVE 06/15/2021 1659   KETONESUR NEGATIVE 06/15/2021 1659   PROTEINUR NEGATIVE 06/15/2021 1659   NITRITE NEGATIVE 06/15/2021 1659   LEUKOCYTESUR NEGATIVE 06/15/2021 1659    Radiological Exams on Admission: CT Head Wo Contrast  Result Date: 06/15/2021 CLINICAL DATA:  Syncope/presyncope, cerebrovascular cause suspected. 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 EXAM: CT HEAD WITHOUT CONTRAST TECHNIQUE: Contiguous axial images were obtained from the base of the skull through the vertex without intravenous contrast. RADIATION DOSE REDUCTION: This exam was performed according to the departmental dose-optimization program which includes automated exposure control, adjustment of the mA and/or kV according to patient size and/or use of iterative reconstruction technique. COMPARISON:  Brain MRI 05/14/2021.  Brain MRI 12/19/2020. FINDINGS: Brain: Cerebral volume is normal. Mild patchy and ill-defined hypoattenuation within the cerebral white matter, nonspecific but compatible with chronic small vessel ischemic disease. The subcentimeter lesion within the pituitary gland described on the prior brain MRI of 05/14/2021 is occult by CT. There is no acute infarct. No chronic intracranial blood products. No extra-axial fluid collection. No midline  shift. Vascular: No hyperdense vessel.  Atherosclerotic calcifications. Skull: Normal. Negative for fracture or focal lesion. Sinuses/Orbits: Visualized orbits show no acute finding. No significant paranasal sinus disease at the imaged levels. IMPRESSION: No evidence of acute intracranial hemorrhage or acute infarct. The 4 mm pituitary gland lesion described on the prior brain MRI of 05/14/2021 is occult by CT. Mild chronic small vessel ischemic changes within the cerebral white matter. Please note a brain MRI with contrast would have greater sensitivity for intracranial metastatic disease. Electronically Signed   By: Kellie Simmering D.O.   On: 06/15/2021 12:18   CT Angio Chest PE W and/or Wo Contrast  Result Date: 06/15/2021 CLINICAL DATA:  Chest pain, positive D-dimer EXAM: CT ANGIOGRAPHY CHEST WITH CONTRAST  TECHNIQUE: Multidetector CT imaging of the chest was performed using the standard protocol during bolus administration of intravenous contrast. Multiplanar CT image reconstructions and MIPs were obtained to evaluate the vascular anatomy. RADIATION DOSE REDUCTION: This exam was performed according to the departmental dose-optimization program which includes automated exposure control, adjustment of the mA and/or kV according to patient size and/or use of iterative reconstruction technique. CONTRAST:  168mL OMNIPAQUE IOHEXOL 350 MG/ML SOLN COMPARISON:  Previous CT done on 06/23/2020, chest radiograph done on 06/15/2021 FINDINGS: Cardiovascular: Contrast density in the thoracic aorta is less than adequate to evaluate for dissection. There are no intraluminal filling defects in pulmonary artery branches. Mediastinum/Nodes: Slightly enlarged lymph nodes are seen in the mediastinum and hilar regions. Lungs/Pleura: There is no focal pulmonary consolidation. There are no signs of pulmonary edema. Slightly increased interstitial markings are seen in the subpleural location in the posterior lower lung fields. There is  no pleural effusion or pneumothorax. Upper Abdomen: There are foci of cortical thinning in the right kidney suggesting scarring from chronic pyelonephritis. There is minimal stranding in the fat planes adjacent to the body and tail of pancreas without any loculated fluid collections. Musculoskeletal: Unremarkable. Review of the MIP images confirms the above findings. IMPRESSION: There is no evidence of pulmonary artery embolism. There is no focal pulmonary consolidation. Subtle increased interstitial markings are seen in the posterior lower lung fields suggesting scarring or interstitial pneumonitis. There is mild stranding in the fat planes adjacent to body and tail of pancreas. This may be residual scarring from previous inflammation or suggest mild acute pancreatitis. Please correlate with laboratory findings. There are multiple foci of cortical thinning in the right kidney suggesting scarring from chronic pyelonephritis. Electronically Signed   By: Elmer Picker M.D.   On: 06/15/2021 16:16   DG Chest Portable 1 View  Result Date: 06/15/2021 CLINICAL DATA:  Loss of consciousness. EXAM: PORTABLE CHEST 1 VIEW COMPARISON:  02/23/2020 FINDINGS: The heart size and mediastinal contours are within normal limits. Both lungs are clear. The visualized skeletal structures are unremarkable. IMPRESSION: No active disease. Electronically Signed   By: Kerby Moors M.D.   On: 06/15/2021 11:09     EKG: Independently reviewed, with result as described above.    Assessment/Plan   Principal Problem:   Syncope Active Problems:   Tobacco abuse   COPD (chronic obstructive pulmonary disease) (HCC)   Orthostatic hypotension   AKI (acute kidney injury) ([place])   Hypercalcemia   Adrenal insufficiency (HCC)     #) Syncope: 1 episode of syncope that occurred when the patient was moving from a seated to standing position and associated with prodrome, suggestive of potential orthostatic hypotension in the setting  of dehydration as evidenced by acute kidney injury as well as hypercalcemia, with predisposing factors that include relative right sided circulation as a consequence of likely relative pulmonary hypertension and stemming from underlying COPD, and as evidenced by report of orthostatic vital signs in the ED that were suggestive of orthostatic hypotension, as further detailed above.  Furthermore, the patient was recently diagnosed with adrenal insufficiency, further increasing his likelihood for orthostatic hypotension, while noting his good compliance with recent initiation of fludrocortisone.   Not associated with any overt acute focal neurologic deficits. Clinically, acute ischemic stroke versus seizures appear less likely at this time, and CT head showed no evidence of acute intracranial process, including no evidence of intracranial hemorrhage nor any evidence of acute ischemic infarct. Presentation appears less consistent with ACS at this  time, with serial troponin found to be nonelevated, presenting EKG showing no evidence of acute ischemic changes, and the absence of any associated CP.  However, we will continue to trend serial troponin to further evaluate, and also check echocardiogram in the morning.  In setting of associated prodrome, the possibility of ventricular arrhythmia appears to be less likely at this time, although will closely monitor on telemetry for any e/o potential contributory arrhythmia, while also assessing serum Mg level.    Plan: will continue IVF's in the form of 75 cc/h. Monitor on telemetry.  Monitor strict I's and O's.  Add-on serum Mg level. Check CMP, CBC, serum Mg level in the AM. Fall precautions ordered. Trend trop.  Echo ordered for the morning.  Continue outpatient fludrocortisone      #) Acute kidney injury: Presenting serum creatinine 1.46 relative to most recent prior value of 1.11 on 05/22/2021.  Suspect contribution from dehydration given clinical appearance of  such, in addition to finding of hypercalcemia, which is also felt to be consistent with dehydration.  Of note, urinalysis demonstrated no evidence of urinary casts, including no white blood cell/red blood cell casts and was negative for protein.  Plan: Continuous LR overnight, as above.  Monitor strict I's and O's and daily weights.  Tempt avoid nephrotoxic agents.  Add on random urine sodium as well as random urine creatinine.  Repeat CMP in the morning.       #) Hypercalcemia: Presenting labs reflect serum calcium, corrected for mild hypoalbuminemia, to be 11.0.  Suspect element of dehydration, without overt pharmacologic contribution. Will continue IVF's, as above, with repeat calcium level in the morning, with consideration for further expansion of work-up if no ensuing improvement in serum calcium level following interval IVF administration.     Plan: LR, as above.  Monitor strict I's&O's, daily weights.  CMP in the morning.  Ionized calcium.  Check serum Mg and Phos levels.      #) Adrenal insufficiency: Documented history of such, on fludrocortisone, which was recently initiated.  Relevant in the setting of presenting syncope, felt to be orthostatic in nature.  Plan: Continue home fludrocortisone, as above.       #) COPD: Documented history of such, on Trelegy as an outpatient.  No clinical evidence to suggest acute exacerbation at this time.  Plan: Continue home Trelegy.  Repeat BMP in the morning.       #) Chronic tobacco abuse: Patient conveys that they are a current smoker, having smoked 0.5 ppd for approximately 40 years years.   Plan: Counseled the patient on the importance of complete smoking discontinuation.  Order placed for prn nicotine patch for use during this hospitalization.      DVT prophylaxis: SCD's   Code Status: Full code Family Communication: none Disposition Plan: Per Rounding Team Consults called: none;  Admission status: Observation; med  telemetry    PLEASE NOTE THAT DRAGON DICTATION SOFTWARE WAS USED IN THE CONSTRUCTION OF THIS NOTE.   [place] DO [facility]  From [place]   06/16/2021, 4:56 AM

## 2021-06-16 NOTE — ED Notes (Signed)
Pt ambulated to restroom with steady gait. Pt denied dizziness. Pt color appears pink during ambulation. Pt sitting on edge of bed with breakfast tray.  Pt states he no longer smokes and does not need nicotine patch.

## 2021-06-16 NOTE — ED Notes (Signed)
Echo at bedside

## 2021-06-16 NOTE — Progress Notes (Signed)
Echocardiogram 2D Echocardiogram has been performed.  Jefferey Pica 06/16/2021, 8:41 AM

## 2021-06-17 ENCOUNTER — Other Ambulatory Visit: Payer: Self-pay

## 2021-06-17 ENCOUNTER — Inpatient Hospital Stay (HOSPITAL_BASED_OUTPATIENT_CLINIC_OR_DEPARTMENT_OTHER): Payer: BC Managed Care – PPO | Admitting: Hematology

## 2021-06-17 ENCOUNTER — Inpatient Hospital Stay: Payer: BC Managed Care – PPO | Attending: Hematology

## 2021-06-17 VITALS — BP 112/68 | HR 81 | Temp 97.9°F | Resp 20 | Wt 173.7 lb

## 2021-06-17 DIAGNOSIS — E230 Hypopituitarism: Secondary | ICD-10-CM | POA: Diagnosis not present

## 2021-06-17 DIAGNOSIS — C799 Secondary malignant neoplasm of unspecified site: Secondary | ICD-10-CM | POA: Insufficient documentation

## 2021-06-17 DIAGNOSIS — D352 Benign neoplasm of pituitary gland: Secondary | ICD-10-CM

## 2021-06-17 DIAGNOSIS — C4360 Malignant melanoma of unspecified upper limb, including shoulder: Secondary | ICD-10-CM | POA: Insufficient documentation

## 2021-06-17 DIAGNOSIS — Z79899 Other long term (current) drug therapy: Secondary | ICD-10-CM | POA: Diagnosis not present

## 2021-06-17 DIAGNOSIS — Z5112 Encounter for antineoplastic immunotherapy: Secondary | ICD-10-CM | POA: Diagnosis not present

## 2021-06-17 DIAGNOSIS — C4359 Malignant melanoma of other part of trunk: Secondary | ICD-10-CM

## 2021-06-17 LAB — CMP (CANCER CENTER ONLY)
ALT: 7 U/L (ref 0–44)
AST: 15 U/L (ref 15–41)
Albumin: 3.5 g/dL (ref 3.5–5.0)
Alkaline Phosphatase: 106 U/L (ref 38–126)
Anion gap: 7 (ref 5–15)
BUN: 9 mg/dL (ref 8–23)
CO2: 25 mmol/L (ref 22–32)
Calcium: 9.7 mg/dL (ref 8.9–10.3)
Chloride: 106 mmol/L (ref 98–111)
Creatinine: 1.17 mg/dL (ref 0.61–1.24)
GFR, Estimated: >60 mL/min (ref >60)
Glucose, Bld: 84 mg/dL (ref 70–99)
Potassium: 3.6 mmol/L (ref 3.5–5.1)
Sodium: 138 mmol/L (ref 135–145)
Total Bilirubin: 1 mg/dL (ref 0.3–1.2)
Total Protein: 6.7 g/dL (ref 6.5–8.1)

## 2021-06-17 LAB — CBC WITH DIFFERENTIAL (CANCER CENTER ONLY)
Abs Immature Granulocytes: 0.01 10*3/uL (ref 0.00–0.07)
Basophils Absolute: 0 10*3/uL (ref 0.0–0.1)
Basophils Relative: 1 %
Eosinophils Absolute: 0.5 10*3/uL (ref 0.0–0.5)
Eosinophils Relative: 8 %
HCT: 39.3 % (ref 39.0–52.0)
Hemoglobin: 13.2 g/dL (ref 13.0–17.0)
Immature Granulocytes: 0 %
Lymphocytes Relative: 30 %
Lymphs Abs: 2 10*3/uL (ref 0.7–4.0)
MCH: 31.5 pg (ref 26.0–34.0)
MCHC: 33.6 g/dL (ref 30.0–36.0)
MCV: 93.8 fL (ref 80.0–100.0)
Monocytes Absolute: 0.6 10*3/uL (ref 0.1–1.0)
Monocytes Relative: 9 %
Neutro Abs: 3.5 10*3/uL (ref 1.7–7.7)
Neutrophils Relative %: 52 %
Platelet Count: 219 10*3/uL (ref 150–400)
RBC: 4.19 MIL/uL — ABNORMAL LOW (ref 4.22–5.81)
RDW: 12.1 % (ref 11.5–15.5)
WBC Count: 6.6 10*3/uL (ref 4.0–10.5)
nRBC: 0 % (ref 0.0–0.2)

## 2021-06-17 LAB — TSH: TSH: 0.494 u[IU]/mL (ref 0.320–4.118)

## 2021-06-17 LAB — CALCIUM, IONIZED: Calcium, Ionized, Serum: 5.8 mg/dL — ABNORMAL HIGH (ref 4.5–5.6)

## 2021-06-17 MED ORDER — MIDODRINE HCL 2.5 MG PO TABS: 2.5000 mg | ORAL_TABLET | Freq: Three times a day (TID) | ORAL | 0 refills | Status: DC

## 2021-06-18 ENCOUNTER — Inpatient Hospital Stay: Payer: BC Managed Care – PPO

## 2021-06-18 ENCOUNTER — Telehealth: Payer: Self-pay

## 2021-06-18 ENCOUNTER — Telehealth: Payer: Self-pay | Admitting: Hematology

## 2021-06-18 ENCOUNTER — Inpatient Hospital Stay: Payer: BC Managed Care – PPO | Admitting: Hematology

## 2021-06-18 NOTE — Telephone Encounter (Signed)
Left message with follow-up appointment per 2/1 los.

## 2021-06-18 NOTE — Telephone Encounter (Signed)
Spoke to pt's wife per DPR. She said he is doing better after ER visit. Going to see Dr Loanne Drilling next week. He has midodrine now to help with the BP drop.

## 2021-06-22 ENCOUNTER — Ambulatory Visit: Payer: BC Managed Care – PPO | Admitting: Endocrinology

## 2021-06-23 ENCOUNTER — Ambulatory Visit: Payer: BC Managed Care – PPO | Admitting: Endocrinology

## 2021-06-23 ENCOUNTER — Other Ambulatory Visit: Payer: Self-pay

## 2021-06-23 ENCOUNTER — Encounter: Payer: Self-pay | Admitting: Oncology

## 2021-06-23 ENCOUNTER — Ambulatory Visit (INDEPENDENT_AMBULATORY_CARE_PROVIDER_SITE_OTHER): Payer: BC Managed Care – PPO | Admitting: Endocrinology

## 2021-06-23 VITALS — BP 90/56 | HR 72 | Ht 72.0 in | Wt 166.8 lb

## 2021-06-23 DIAGNOSIS — E0590 Thyrotoxicosis, unspecified without thyrotoxic crisis or storm: Secondary | ICD-10-CM

## 2021-06-23 DIAGNOSIS — R55 Syncope and collapse: Secondary | ICD-10-CM

## 2021-06-23 DIAGNOSIS — E2740 Unspecified adrenocortical insufficiency: Secondary | ICD-10-CM

## 2021-06-23 LAB — BASIC METABOLIC PANEL
BUN: 14 mg/dL (ref 6–23)
CO2: 29 mEq/L (ref 19–32)
Calcium: 9.3 mg/dL (ref 8.4–10.5)
Chloride: 102 mEq/L (ref 96–112)
Creatinine, Ser: 1.3 mg/dL (ref 0.40–1.50)
GFR: 59.32 mL/min — ABNORMAL LOW (ref >60.00)
Glucose, Bld: 109 mg/dL — ABNORMAL HIGH (ref 70–99)
Potassium: 4.6 mEq/L (ref 3.5–5.1)
Sodium: 135 mEq/L (ref 135–145)

## 2021-06-23 LAB — CORTISOL
Cortisol, Plasma: 0.7 ug/dL
Cortisol, Plasma: 6.5 ug/dL

## 2021-06-23 LAB — TSH: TSH: 2.37 u[IU]/mL (ref 0.35–5.50)

## 2021-06-23 LAB — FOLLICLE STIMULATING HORMONE: FSH: 14.1 m[IU]/mL (ref 1.4–18.1)

## 2021-06-23 LAB — BRAIN NATRIURETIC PEPTIDE: Pro B Natriuretic peptide (BNP): 174 pg/mL — ABNORMAL HIGH (ref 0.0–100.0)

## 2021-06-23 LAB — T4, FREE: Free T4: 0.92 ng/dL (ref 0.60–1.60)

## 2021-06-23 LAB — LUTEINIZING HORMONE: LH: 7.63 m[IU]/mL (ref 1.50–9.30)

## 2021-06-23 MED ORDER — PREDNISONE 10 MG PO TABS: 10.0000 mg | ORAL_TABLET | ORAL | 3 refills | Status: DC

## 2021-06-23 MED ORDER — METHIMAZOLE 10 MG PO TABS: 10.0000 mg | ORAL_TABLET | Freq: Two times a day (BID) | ORAL | 1 refills | Status: DC

## 2021-06-23 MED ORDER — COSYNTROPIN 0.25 MG IJ SOLR
0.2500 mg | Freq: Once | INTRAMUSCULAR | Status: AC
  Administered 2021-06-23: 0.25 mg via INTRAVENOUS

## 2021-06-23 NOTE — Patient Instructions (Addendum)
Blood tests are requested for you today.  We'll let you know about the results.    Please continue the same fludrocortisone. I have sent a prescription to your pharmacy, to add prednisone. Please come back for a follow-up appointment in 1 month.

## 2021-06-23 NOTE — Progress Notes (Addendum)
HEMATOLOGY/ONCOLOGY CLINIC NOTE  Date of Service: .06/17/2021  Patient Care Team: George Carbon, MD as PCP - General (Internal Medicine)  CHIEF COMPLAINTS/PURPOSE OF CONSULTATION:  Follow-up for continued management of metastatic melanoma.  HISTORY OF PRESENTING ILLNESS:  Previous notes for details of initial presentation  INTERVAL HISTORY  [name] is here for continued evaluation and management of metastatic melanoma. He has been following with Dr. Buddy West for management of his autoimmune thyroiditis and pituitary insufficiency. He has been started on fludrocortisone which was increased to 0.2 mcg. Still having low today and could not proceed with immunotherapy. We did start him on low-dose midodrine and patient received some IV fluids with improvement. No other acute new focal symptoms.  MEDICAL HISTORY:  Past Medical History:  Diagnosis Date   Adrenal insufficiency (HCC)    Cancer (HCC)    COPD (chronic obstructive pulmonary disease) (HCC)    Depression    Dyspnea    with 31f of ambulation. 200 feet if he walks slow.   Fatty liver    History of kidney stones    seen on CT Scan    SURGICAL HISTORY: Past Surgical History:  Procedure Laterality Date   MASS EXCISION Right 12/15/2020   Procedure: EXCISION OF MALIGNANT MASS RIGHT POSTERIOR SHOULDER;  Surgeon: BStark Klein MD;  [place] [facility]  Service: General;  Laterality: Right;   WISDOM TOOTH EXTRACTION      SOCIAL HISTORY: Social History   Socioeconomic History   Marital status: Married    Spouse name: Not on file   Number of children: 2   Years of education: Not on file   Highest education level: Not on file  Occupational History   Occupation: PPublic librarian   Comment: [employer]  Tobacco Use   Smoking status: Every Day    Packs/day: 0.50    Years: 40.00    Pack years: 20.00    Types: Cigarettes    Start date: 05/17/1981   Smokeless tobacco: Never  Vaping Use    Vaping Use: Never used  Substance and Sexual Activity   Alcohol use: Yes    Alcohol/week: 6.0 standard drinks    Types: 4 Cans of beer, 2 Standard drinks or equivalent per week   Drug use: No   Sexual activity: Yes    Partners: Female    Birth control/protection: None  Other Topics Concern   Not on file  Social History Narrative   Married   2 children--- 1 daughter/1 son   6 step children   Social Determinants of Health   Financial Resource Strain: Not on file  Food Insecurity: Not on file  Transportation Needs: Not on file  Physical Activity: Not on file  Stress: Not on file  Social Connections: Not on file  Intimate Partner Violence: Not on file    FAMILY HISTORY: Family History  Problem Relation Age of Onset   Stroke Mother    Dementia Mother    Breast cancer Sister    Thyroid cancer Sister    Stroke Maternal Grandmother    Breast cancer Paternal Grandmother    Thyroid disease Neg Hx     ALLERGIES:  is allergic to duloxetine.  MEDICATIONS:  Current Outpatient Medications  Medication Sig Dispense Refill   fludrocortisone (FLORINEF) 0.1 MG tablet Take 1 tablet (0.1 mg total) by mouth daily. 90 tablet 0   Fluticasone-Umeclidin-Vilant (TRELEGY ELLIPTA) 100-62.5-25 MCG/INH AEPB Inhale 1 puff into the lungs daily. 60 each 6  methimazole (TAPAZOLE) 10 MG tablet Take 1 tablet (10 mg total) by mouth 2 (two) times daily. 180 tablet 1   polyvinyl alcohol (LIQUIFILM TEARS) 1.4 % ophthalmic solution Place 1 drop into both eyes as needed for dry eyes.     predniSONE (DELTASONE) 10 MG tablet Take 1 tablet (10 mg total) by mouth every morning. 90 tablet 3   prochlorperazine (COMPAZINE) 10 MG tablet Take 1 tablet (10 mg total) by mouth every 6 (six) hours as needed for nausea or vomiting. 30 tablet 0   No current facility-administered medications for this visit.    REVIEW OF SYSTEMS:   10 Point review of Systems was done is negative except as noted above.  PHYSICAL  EXAMINATION: ECOG PERFORMANCE STATUS: 1 - Symptomatic but completely ambulatory  .BP 112/68    Pulse 81    Temp 97.9 F (36.6 C)    Resp 20    Wt 173 lb 11.2 oz (78.8 kg)    SpO2 100%    BMI 23.56 kg/m  NAD GENERAL:alert, in no acute distress and comfortable SKIN: no acute rashes, no significant lesions EYES: conjunctiva are pink and non-injected, sclera anicteric OROPHARYNX: MMM, no exudates, no oropharyngeal erythema or ulceration NECK: supple, no JVD LYMPH:  no palpable lymphadenopathy in the cervical, axillary or inguinal regions LUNGS: clear to auscultation b/l with normal respiratory effort HEART: regular rate & rhythm ABDOMEN:  normoactive bowel sounds , non tender, not distended. Extremity: no pedal edema PSYCH: alert & oriented x 3 with fluent speech NEURO: no focal motor/sensory deficits  LABORATORY DATA:  I have reviewed the data as listed  . CBC Latest Ref Rng & Units 06/17/2021 06/16/2021  WBC 4.0 - 10.5 K/uL 6.6 5.8  Hemoglobin 13.0 - 17.0 g/dL 13.2 11.6(L)  Hematocrit 39.0 - 52.0 % 39.3 36.7(L)  Platelets 150 - 400 K/uL 219 189    . CMP Latest Ref Rng & Units Care 06/23/2021 06/17/2021  Glucose 70 - 99 mg/dL 109(H) 84  BUN 8 - 23 mg/dL 14 9  Creatinine 0.61 - 1.24 mg/dL 1.30 1.17  Sodium 135 - 145 mmol/L 135 138  Potassium 3.5 - 5.1 mmol/L 4.6 3.6  Chloride 98 - 111 mmol/L 102 106  CO2 22 - 32 mmol/L 29 25  Calcium 8.9 - 10.3 mg/dL 9.3 9.7  Total Protein 6.5 - 8.1 g/dL - 6.7  Total Bilirubin 0.3 - 1.2 mg/dL - 1.0  Alkaline Phos 38 - 126 U/L - 106  AST 15 - 41 U/L - 15  ALT 0 - 44 U/L - 7   SURGICAL PATHOLOGY  CASE: [id number]  PATIENT: George West  Surgical Pathology Report   Clinical History: right posterior shoulder soft tissue mass (cm)   FINAL MICROSCOPIC DIAGNOSIS:   A. SOFT TISSUE MASS, RIGHT POSTERIOR SHOULDER, NEEDLE CORE BIOPSY:  -  Malignant melanoma  -  See comment   COMMENT:   By immunohistochemistry, the neoplastic cells are  positive for S100,  HMB45, Sox 10 and Melan-A (patchy) but negative for CD45, CD138,  cytokeratin 7, cytokeratin 20, cytokeratin AE1/3, desmin and EMA.  The  morphology and immunophenotype are consistent with malignant melanoma.  Dr. Irene West was notified of these results on November 24, 2020.  Dr. Saralyn West  reviewed the case and agrees with the above diagnosis.    RADIOGRAPHIC STUDIES: I have personally reviewed the radiological images as listed and agreed with the findings in the report. CT Head Wo Contrast  Result Date: 06/15/2021 CLINICAL DATA:  Syncope/presyncope,  cerebrovascular cause suspected. 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 EXAM: CT HEAD WITHOUT CONTRAST TECHNIQUE: Contiguous axial images were obtained from the base of the skull through the vertex without intravenous contrast. RADIATION DOSE REDUCTION: This exam was performed according to the departmental dose-optimization program which includes automated exposure control, adjustment of the mA and/or kV according to patient size and/or use of iterative reconstruction technique. COMPARISON:  Brain MRI 05/14/2021.  Brain MRI 12/19/2020. FINDINGS: Brain: Cerebral volume is normal. Mild patchy and ill-defined hypoattenuation within the cerebral white matter, nonspecific but compatible with chronic small vessel ischemic disease. The subcentimeter lesion within the pituitary gland described on the prior brain MRI of 05/14/2021 is occult by CT. There is no acute infarct. No chronic intracranial blood products. No extra-axial fluid collection. No midline shift. Vascular: No hyperdense vessel.  Atherosclerotic calcifications. Skull: Normal. Negative for fracture or focal lesion. Sinuses/Orbits: Visualized orbits show no acute finding. No significant paranasal sinus disease at the imaged levels. IMPRESSION: No evidence of acute intracranial hemorrhage or acute infarct. The 4 mm pituitary gland lesion described on the prior  brain MRI of 05/14/2021 is occult by CT. Mild chronic small vessel ischemic changes within the cerebral white matter. Please note a brain MRI with contrast would have greater sensitivity for intracranial metastatic disease. Electronically Signed   By: Kellie Simmering D.O.   On: 06/15/2021 12:18   CT Angio Chest PE W and/or Wo Contrast  Result Date: 06/15/2021 CLINICAL DATA:  Chest pain, positive D-dimer EXAM: CT ANGIOGRAPHY CHEST WITH CONTRAST TECHNIQUE: Multidetector CT imaging of the chest was performed using the standard protocol during bolus administration of intravenous contrast. Multiplanar CT image reconstructions and MIPs were obtained to evaluate the vascular anatomy. RADIATION DOSE REDUCTION: This exam was performed according to the departmental dose-optimization program which includes automated exposure control, adjustment of the mA and/or kV according to patient size and/or use of iterative reconstruction technique. CONTRAST:  126m OMNIPAQUE IOHEXOL 350 MG/ML SOLN COMPARISON:  Previous CT done on 06/23/2020, chest radiograph done on 06/15/2021 FINDINGS: Cardiovascular: Contrast density in the thoracic aorta is less than adequate to evaluate for dissection. There are no intraluminal filling defects in pulmonary artery branches. Mediastinum/Nodes: Slightly enlarged lymph nodes are seen in the mediastinum and hilar regions. Lungs/Pleura: There is no focal pulmonary consolidation. There are no signs of pulmonary edema. Slightly increased interstitial markings are seen in the subpleural location in the posterior lower lung fields. There is no pleural effusion or pneumothorax. Upper Abdomen: There are foci of cortical thinning in the right kidney suggesting scarring from chronic pyelonephritis. There is minimal stranding in the fat planes adjacent to the body and tail of pancreas without any loculated fluid collections. Musculoskeletal: Unremarkable. Review of the MIP images confirms the above findings.  IMPRESSION: There is no evidence of pulmonary artery embolism. There is no focal pulmonary consolidation. Subtle increased interstitial markings are seen in the posterior lower lung fields suggesting scarring or interstitial pneumonitis. There is mild stranding in the fat planes adjacent to body and tail of pancreas. This may be residual scarring from previous inflammation or suggest mild acute pancreatitis. Please correlate with laboratory findings. There are multiple foci of cortical thinning in the right kidney suggesting scarring from chronic pyelonephritis. Electronically Signed   By: PElmer PickerM.D.   On: 06/15/2021 16:16   DG Chest Portable 1 View  Result Date: 06/15/2021 CLINICAL DATA:  Loss of consciousness. EXAM: PORTABLE CHEST 1 VIEW COMPARISON:  02/23/2020 FINDINGS:  The heart size and mediastinal contours are within normal limits. Both lungs are clear. The visualized skeletal structures are unremarkable. IMPRESSION: No active disease. Electronically Signed   By: Kerby Moors M.D.   On: 06/15/2021 11:09   ECHOCARDIOGRAM COMPLETE  Result Date: 06/16/2021    ECHOCARDIOGRAM REPORT   Patient Name:   George West Date of Exam: 06/16/2021 Medical Rec #:  [id number]       Height:       72.0 in Accession #:    [id number]      Weight:       170.0 lb Date of Birth:  Sep 25, 1960       BSA:          1.988 m Patient Age:    61 years        BP:           99/73 mmHg Patient Gender: M               HR:           74 bpm. Exam Location:  Inpatient Procedure: 2D Echo Indications:    Syncope  History:        Patient has prior history of Echocardiogram examinations, most                 recent 03/03/2020. COPD.  Sonographer:    Jefferey Pica Referring Phys: [id number] [place]  1. Left ventricular ejection fraction, by estimation, is 60 to 65%. The left ventricle has normal function. The left ventricle has no regional wall motion abnormalities. Left ventricular diastolic parameters are  consistent with Grade II diastolic dysfunction (pseudonormalization).  2. Right ventricular systolic function is normal. The right ventricular size is normal. There is normal pulmonary artery systolic pressure. The estimated right ventricular systolic pressure is 19.5 mmHg.  3. The mitral valve is normal in structure. Trivial mitral valve regurgitation. No evidence of mitral stenosis.  4. The aortic valve is tricuspid. There is mild calcification of the aortic valve. Aortic valve regurgitation is not visualized. Aortic valve sclerosis is present, with no evidence of aortic valve stenosis.  5. The inferior vena cava is normal in size with greater than 50% respiratory variability, suggesting right atrial pressure of 3 mmHg. Comparison(s): No significant change from prior study. Prior images reviewed side by side. FINDINGS  Left Ventricle: Left ventricular ejection fraction, by estimation, is 60 to 65%. The left ventricle has normal function. The left ventricle has no regional wall motion abnormalities. The left ventricular internal cavity size was normal in size. There is  no left ventricular hypertrophy. Left ventricular diastolic parameters are consistent with Grade II diastolic dysfunction (pseudonormalization). Right Ventricle: The right ventricular size is normal. No increase in right ventricular wall thickness. Right ventricular systolic function is normal. There is normal pulmonary artery systolic pressure. The tricuspid regurgitant velocity is 2.24 m/s, and  with an assumed right atrial pressure of 5 mmHg, the estimated right ventricular systolic pressure is 09.3 mmHg. Left Atrium: Left atrial size was normal in size. Right Atrium: Right atrial size was normal in size. Pericardium: There is no evidence of pericardial effusion. Mitral Valve: The mitral valve is normal in structure. Trivial mitral valve regurgitation. No evidence of mitral valve stenosis. Tricuspid Valve: The tricuspid valve is normal in  structure. Tricuspid valve regurgitation is mild . No evidence of tricuspid stenosis. Aortic Valve: The aortic valve is tricuspid. There is mild calcification of the aortic valve. Aortic valve regurgitation is  not visualized. Aortic valve sclerosis is present, with no evidence of aortic valve stenosis. Aortic valve peak gradient measures 7.1 mmHg. Pulmonic Valve: The pulmonic valve was normal in structure. Pulmonic valve regurgitation is not visualized. No evidence of pulmonic stenosis. Aorta: The aortic root is normal in size and structure. Venous: The inferior vena cava is normal in size with greater than 50% respiratory variability, suggesting right atrial pressure of 3 mmHg. IAS/Shunts: No atrial level shunt detected by color flow Doppler.  LEFT VENTRICLE PLAX 2D LVIDd:         4.50 cm   Diastology LVIDs:         2.90 cm   LV e' medial:    6.43 cm/s LV PW:         1.10 cm   LV E/e' medial:  14.9 LV IVS:        1.00 cm   LV e' lateral:   16.20 cm/s LVOT diam:     2.00 cm   LV E/e' lateral: 5.9 LV SV:         93 LV SV Index:   47 LVOT Area:     3.14 cm  RIGHT VENTRICLE             IVC RV Basal diam:  2.60 cm     IVC diam: 1.90 cm RV S prime:     15.10 cm/s TAPSE (M-mode): 2.6 cm LEFT ATRIUM             Index        RIGHT ATRIUM           Index LA diam:        3.40 cm 1.71 cm/m   RA Area:     13.20 cm LA Vol (A2C):   40.7 ml 20.47 ml/m  RA Volume:   32.20 ml  16.19 ml/m LA Vol (A4C):   26.4 ml 13.28 ml/m LA Biplane Vol: 34.6 ml 17.40 ml/m  AORTIC VALVE                 PULMONIC VALVE AV Area (Vmax): 3.11 cm     PV Vmax:       0.90 m/s AV Vmax:        133.50 cm/s  PV Peak grad:  3.3 mmHg AV Peak Grad:   7.1 mmHg LVOT Vmax:      132.00 cm/s LVOT Vmean:     90.400 cm/s LVOT VTI:       0.296 m  AORTA Ao Root diam: 3.30 cm Ao Asc diam:  3.20 cm MITRAL VALVE               TRICUSPID VALVE MV Area (PHT): 3.48 cm    TR Peak grad:   20.1 mmHg MV Decel Time: 218 msec    TR Vmax:        224.00 cm/s MV E velocity: 96.00  cm/s MV A velocity: 79.30 cm/s  SHUNTS MV E/A ratio:  1.21        Systemic VTI:  0.30 m                            Systemic Diam: 2.00 cm Candee Furbish MD Electronically signed by Candee Furbish MD Signature Date/Time: 06/16/2021/10:42:40 AM    Final      ASSESSMENT & PLAN:   61 yo with   1) 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  Unclear primary site with the patient presented with a metastatic deposit in the soft tissue/skin over the right posterior shoulder. Patient also has multiple slightly hypermetabolic lymph nodes-right axillary, subpectoral and right cervical.  These could be from metastatic melanoma or a possible indolent lymphoma related to his IgM kappa monoclonal paraproteinemia. -Molecular testing was sent on the pathology sample and is BRAF positive -PD-L1 testing positive for PD-L1 expression at 1%  3) grade 1 diarrhea likely from ipilimumab plus nivolumab.  Resolving of ipilimumab. 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 -Patient was encouraged to make sure he hydrates well and increases the salt in his diet at this time -Placed on midodrine 2.5 mg 3 times daily as needed for hypotension with clear holding parameters. -Continue follow-up with endocrinology to continue to optimize management of thyroiditis and secondary adrenal insufficiency. -Patient will immunotherapy today pending more stable blood pressures . No orders of the defined types were placed in this encounter.   FOLLOW UP --Please schedule next dose of nivolumab in 10 to 12 days with labs  and MD visit. (Additional IV fluids 1 L normal saline added to the order set)  The total time spent in the appointment was 30 minutes*.  All of the patient's questions were answered with apparent satisfaction. The patient knows to call the clinic with any problems, questions or concerns.   Sullivan Lone MD MS AAHIVMS [facility] Hematology/Oncology Physician [facility]  .*Total Encounter Time as defined by the Centers for Medicare and Medicaid Services includes, in addition to the face-to-face time of a patient visit (documented in the note above) non-face-to-face time: obtaining and reviewing outside history, ordering and reviewing medications, tests or procedures, care coordination (communications with other health care professionals or caregivers) and documentation in the medical record.

## 2021-06-23 NOTE — Progress Notes (Addendum)
Subjective:    Patient ID: George West, male    DOB: May 14, 1961, 61 y.o.   MRN: [id number]  HPI Pt returns for f/u of pituitary insuff (dx'ed 2022; prob due to ipilimumab; MRI showed 4 mm nodule; main clinical manifestation has been hypotension).  Pt had syncopal episode last week, and was found to be hypotensive.   Past Medical History:  Diagnosis Date   Adrenal insufficiency (HCC)    Cancer (HCC)    COPD (chronic obstructive pulmonary disease) (HCC)    Depression    Dyspnea    with 19ft of ambulation. 200 feet if he walks slow.   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;  Surgeon: Stark Klein, MD;  Location: [facility];  Service: General;  Laterality: Right;   WISDOM TOOTH EXTRACTION      Social History   Socioeconomic History   Marital status: Married    Spouse name: Not on file   Number of children: 2   Years of education: Not on file   Highest education level: Not on file  Occupational History   Occupation: Public librarian    Comment: [employer]  Tobacco Use   Smoking status: Every Day    Packs/day: 0.50    Years: 40.00    Pack years: 20.00    Types: Cigarettes    Start date: 05/17/1981   Smokeless tobacco: Never  Vaping Use   Vaping Use: Never used  Substance and Sexual Activity   Alcohol use: Yes    Alcohol/week: 6.0 standard drinks    Types: 4 Cans of beer, 2 Standard drinks or equivalent per week   Drug use: No   Sexual activity: Yes    Partners: Female    Birth control/protection: None  Other Topics Concern   Not on file  Social History Narrative   Married   2 children--- 1 daughter/1 son   6 step children   Social Determinants of Health   Financial Resource Strain: Not on file  Food Insecurity: Not on file  Transportation Needs: Not on file  Physical  Activity: Not on file  Stress: Not on file  Social Connections: Not on file  Intimate Partner Violence: Not on file    Current Outpatient Medications on File Prior to Visit  Medication Sig Dispense Refill   fludrocortisone (FLORINEF) 0.1 MG tablet Take 1 tablet (0.1 mg total) by mouth 2 (two) times daily. 90 tablet 0   Fluticasone-Umeclidin-Vilant (TRELEGY ELLIPTA) 100-62.5-25 MCG/INH AEPB Inhale 1 puff into the lungs daily. 60 each 6   midodrine (PROAMATINE) 2.5 MG tablet Take 1 tablet (2.5 mg total) by mouth 3 (three) times daily with meals. Measure blood pressure before each dose and hold medication if systolic blood pressure more than 160. 60 tablet 0   polyvinyl alcohol (LIQUIFILM TEARS) 1.4 % ophthalmic solution Place 1 drop into both eyes as needed for dry eyes.     prochlorperazine (COMPAZINE) 10 MG tablet Take 1 tablet (10 mg total) by mouth every 6 (six) hours as needed for nausea or vomiting. 30 tablet 0   No current facility-administered medications on file prior to visit.    Allergies  Allergen Reactions   Duloxetine     dizziness    Family History  Problem Relation Age of Onset  Stroke Mother    Dementia Mother    Breast cancer Sister    Thyroid cancer Sister    Stroke Maternal Grandmother    Breast cancer Paternal Grandmother    Thyroid disease Neg Hx     BP (!) 90/56 Comment: Supine   Pulse 72    Ht 6' (1.829 m)    Wt 166 lb 12.8 oz (75.7 kg)    SpO2 92%    BMI 22.62 kg/m   Review of Systems     Objective:   Physical Exam VITAL SIGNS:  See vs page.  GENERAL: no distress GAIT: unsteady   ACTH stimulation test is done: baseline cortisol level=1 then Cosyntropin 250 mcg is given im 45 minutes later, cortisol level=7 (subnormal response)    Assessment & Plan:  Pit insuff, mild: prob due to ipilimulab.  Hypotension: this is far out of proportion to the above.  Therefore, this should not be considered the sole cause.     Patient Instructions   Blood tests are requested for you today.  We'll let you know about the results.    Please continue the same fludrocortisone. I have sent a prescription to your pharmacy, to add prednisone. Please come back for a follow-up appointment in 1 month.

## 2021-06-24 ENCOUNTER — Encounter: Payer: Self-pay | Admitting: Endocrinology

## 2021-06-26 ENCOUNTER — Other Ambulatory Visit: Payer: Self-pay

## 2021-06-26 DIAGNOSIS — D352 Benign neoplasm of pituitary gland: Secondary | ICD-10-CM

## 2021-06-26 DIAGNOSIS — E230 Hypopituitarism: Secondary | ICD-10-CM

## 2021-06-26 DIAGNOSIS — C4359 Malignant melanoma of other part of trunk: Secondary | ICD-10-CM

## 2021-06-26 LAB — ACTH: C206 ACTH: <5 pg/mL — ABNORMAL LOW (ref 6–50)

## 2021-06-27 LAB — TESTOSTERONE,FREE AND TOTAL
Testosterone, Free: 1.6 pg/mL — ABNORMAL LOW (ref 6.6–18.1)
Testosterone: 481 ng/dL (ref 264–916)

## 2021-06-29 ENCOUNTER — Other Ambulatory Visit: Payer: Self-pay

## 2021-06-29 ENCOUNTER — Encounter: Payer: Self-pay | Admitting: Endocrinology

## 2021-06-29 ENCOUNTER — Inpatient Hospital Stay: Payer: BC Managed Care – PPO

## 2021-06-29 ENCOUNTER — Inpatient Hospital Stay (HOSPITAL_BASED_OUTPATIENT_CLINIC_OR_DEPARTMENT_OTHER): Payer: BC Managed Care – PPO | Admitting: Hematology

## 2021-06-29 VITALS — BP 129/77 | HR 63 | Temp 97.9°F | Resp 17 | Ht 72.0 in | Wt 169.0 lb

## 2021-06-29 DIAGNOSIS — R7989 Other specified abnormal findings of blood chemistry: Secondary | ICD-10-CM

## 2021-06-29 DIAGNOSIS — Z5112 Encounter for antineoplastic immunotherapy: Secondary | ICD-10-CM

## 2021-06-29 DIAGNOSIS — C4359 Malignant melanoma of other part of trunk: Secondary | ICD-10-CM | POA: Diagnosis not present

## 2021-06-29 DIAGNOSIS — E230 Hypopituitarism: Secondary | ICD-10-CM

## 2021-06-29 DIAGNOSIS — F17210 Nicotine dependence, cigarettes, uncomplicated: Secondary | ICD-10-CM

## 2021-06-29 DIAGNOSIS — D352 Benign neoplasm of pituitary gland: Secondary | ICD-10-CM

## 2021-06-29 DIAGNOSIS — C799 Secondary malignant neoplasm of unspecified site: Secondary | ICD-10-CM | POA: Diagnosis not present

## 2021-06-29 DIAGNOSIS — C4360 Malignant melanoma of unspecified upper limb, including shoulder: Secondary | ICD-10-CM | POA: Diagnosis not present

## 2021-06-29 DIAGNOSIS — Z79899 Other long term (current) drug therapy: Secondary | ICD-10-CM | POA: Diagnosis not present

## 2021-06-29 LAB — CBC WITH DIFFERENTIAL (CANCER CENTER ONLY)
Abs Immature Granulocytes: 0.03 10*3/uL (ref 0.00–0.07)
Basophils Absolute: 0.1 10*3/uL (ref 0.0–0.1)
Basophils Relative: 1 %
Eosinophils Absolute: 0.6 10*3/uL — ABNORMAL HIGH (ref 0.0–0.5)
Eosinophils Relative: 6 %
HCT: 40.2 % (ref 39.0–52.0)
Hemoglobin: 13.6 g/dL (ref 13.0–17.0)
Immature Granulocytes: 0 %
Lymphocytes Relative: 33 %
Lymphs Abs: 3.3 10*3/uL (ref 0.7–4.0)
MCH: 31.4 pg (ref 26.0–34.0)
MCHC: 33.8 g/dL (ref 30.0–36.0)
MCV: 92.8 fL (ref 80.0–100.0)
Monocytes Absolute: 0.7 10*3/uL (ref 0.1–1.0)
Monocytes Relative: 7 %
Neutro Abs: 5.1 10*3/uL (ref 1.7–7.7)
Neutrophils Relative %: 53 %
Platelet Count: 363 10*3/uL (ref 150–400)
RBC: 4.33 MIL/uL (ref 4.22–5.81)
RDW: 12.1 % (ref 11.5–15.5)
WBC Count: 9.8 10*3/uL (ref 4.0–10.5)
nRBC: 0 % (ref 0.0–0.2)

## 2021-06-29 LAB — CMP (CANCER CENTER ONLY)
ALT: 33 U/L (ref 0–44)
AST: 28 U/L (ref 15–41)
Albumin: 3.7 g/dL (ref 3.5–5.0)
Alkaline Phosphatase: 108 U/L (ref 38–126)
Anion gap: 7 (ref 5–15)
BUN: 12 mg/dL (ref 8–23)
CO2: 26 mmol/L (ref 22–32)
Calcium: 8.8 mg/dL — ABNORMAL LOW (ref 8.9–10.3)
Chloride: 106 mmol/L (ref 98–111)
Creatinine: 0.99 mg/dL (ref 0.61–1.24)
GFR, Estimated: >60 mL/min (ref >60)
Glucose, Bld: 113 mg/dL — ABNORMAL HIGH (ref 70–99)
Potassium: 3.7 mmol/L (ref 3.5–5.1)
Sodium: 139 mmol/L (ref 135–145)
Total Bilirubin: 0.4 mg/dL (ref 0.3–1.2)
Total Protein: 6.9 g/dL (ref 6.5–8.1)

## 2021-06-29 LAB — TSH: TSH: 2.141 u[IU]/mL (ref 0.320–4.118)

## 2021-06-29 MED ORDER — SODIUM CHLORIDE 0.9 % IV SOLN: Freq: Once | INTRAVENOUS | Status: AC

## 2021-06-29 MED ORDER — SODIUM CHLORIDE 0.9 % IV SOLN
240.0000 mg | Freq: Once | INTRAVENOUS | Status: AC
  Administered 2021-06-29: 240 mg via INTRAVENOUS
  Filled 2021-06-29: qty 24

## 2021-06-29 NOTE — Patient Instructions (Signed)
[facility]  Discharge Instructions: ?Thank you for choosing [facility] to provide your oncology and hematology care.  ? ?If you have a lab appointment with the Cancer Center, please go directly to the Cancer Center and check in at the registration area. ?  ?Wear comfortable clothing and clothing appropriate for easy access to any Portacath or PICC line.  ? ?We strive to give you quality time with your provider. You may need to reschedule your appointment if you arrive late (15 or more minutes).  Arriving late affects you and other patients whose appointments are after yours.  Also, if you miss three or more appointments without notifying the office, you may be dismissed from the clinic at the provider?s discretion.    ?  ?For prescription refill requests, have your pharmacy contact our office and allow 72 hours for refills to be completed.   ? ?Today you received the following chemotherapy and/or immunotherapy agents Opdivo    ?  ?To help prevent nausea and vomiting after your treatment, we encourage you to take your nausea medication as directed. ? ?BELOW ARE SYMPTOMS THAT SHOULD BE REPORTED IMMEDIATELY: ?*FEVER GREATER THAN 100.4 F (38 ?C) OR HIGHER ?*CHILLS OR SWEATING ?*NAUSEA AND VOMITING THAT IS NOT CONTROLLED WITH YOUR NAUSEA MEDICATION ?*UNUSUAL SHORTNESS OF BREATH ?*UNUSUAL BRUISING OR BLEEDING ?*URINARY PROBLEMS (pain or burning when urinating, or frequent urination) ?*BOWEL PROBLEMS (unusual diarrhea, constipation, pain near the anus) ?TENDERNESS IN MOUTH AND THROAT WITH OR WITHOUT PRESENCE OF ULCERS (sore throat, sores in mouth, or a toothache) ?UNUSUAL RASH, SWELLING OR PAIN  ?UNUSUAL VAGINAL DISCHARGE OR ITCHING  ? ?Items with * indicate a potential emergency and should be followed up as soon as possible or go to the Emergency Department if any problems should occur. ? ?Please show the CHEMOTHERAPY ALERT CARD or IMMUNOTHERAPY ALERT CARD at check-in to the  Emergency Department and triage nurse. ? ?Should you have questions after your visit or need to cancel or reschedule your appointment, please contact [facility]  Dept: [phone]  and follow the prompts.  Office hours are 8:00 a.m. to 4:30 p.m. Monday - Friday. Please note that voicemails left after 4:00 p.m. may not be returned until the following business day.  We are closed weekends and major holidays. You have access to a nurse at all times for urgent questions. Please call the main number to the clinic Dept: [phone] and follow the prompts. ? ? ?For any non-urgent questions, you may also contact your provider using MyChart. We now offer e-Visits for anyone 18 and older to request care online for non-urgent symptoms. For details visit [email or web address]. ?  ?Also download the MyChart app! Go to the app store, search "MyChart", open the app, select [facility], and log in with your MyChart username and password. ? ?Due to Covid, a mask is required upon entering the hospital/clinic. If you do not have a mask, one will be given to you upon arrival. For doctor visits, patients may have 1 support person aged 18 or older with them. For treatment visits, patients cannot have anyone with them due to current Covid guidelines and our immunocompromised population.  ? ?

## 2021-07-02 ENCOUNTER — Encounter: Payer: Self-pay | Admitting: Endocrinology

## 2021-07-03 ENCOUNTER — Ambulatory Visit (INDEPENDENT_AMBULATORY_CARE_PROVIDER_SITE_OTHER): Payer: BC Managed Care – PPO | Admitting: Endocrinology

## 2021-07-03 ENCOUNTER — Encounter: Payer: Self-pay | Admitting: Endocrinology

## 2021-07-03 ENCOUNTER — Other Ambulatory Visit: Payer: Self-pay

## 2021-07-03 VITALS — BP 102/64 | HR 72 | Ht 72.0 in | Wt 166.6 lb

## 2021-07-03 DIAGNOSIS — I959 Hypotension, unspecified: Secondary | ICD-10-CM | POA: Diagnosis not present

## 2021-07-03 MED ORDER — FLUDROCORTISONE ACETATE 0.1 MG PO TABS: 0.1000 mg | ORAL_TABLET | Freq: Every day | ORAL | 0 refills | Status: DC

## 2021-07-03 MED ORDER — METHIMAZOLE 10 MG PO TABS: 10.0000 mg | ORAL_TABLET | Freq: Two times a day (BID) | ORAL | 1 refills | Status: DC

## 2021-07-03 MED ORDER — METHIMAZOLE 10 MG PO TABS: 10.0000 mg | ORAL_TABLET | Freq: Every day | ORAL | 1 refills | Status: DC

## 2021-07-03 NOTE — Patient Instructions (Addendum)
I'll fill out the form for the month of Feb.  Please continue the same fludrocortisone and prednisone for now.   As these 2 meds more than make up for the low cortisone, you should conclude that the low cortisone does not completely explain the low blood pressure.   Please see a heart specialist.  you will receive a phone call, about a day and time for an appointment.   Please come back for a follow-up appointment in 2-3 weeks.

## 2021-07-03 NOTE — Progress Notes (Signed)
Subjective:    Patient ID: George West, male    DOB: [birth year], 61 y.o.   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 ACTH: on prednisone and (temporarily) florinef.  Wife says pt has been disabled 05/14/21-ongoing.   Prolactin: borderline high FSH/LH: low TSH: primary hyperthyroidism VP: USG and urine Na+ are normal Since on 2 meds, pt states he feels better in general.  No further LOC, but he has ongoing lightheadedness and fatigue.  Since florinef was reduced, BP at home is 103-164/65-90.   Past Medical History:  Diagnosis Date   Adrenal insufficiency (HCC)    Cancer (HCC)    COPD (chronic obstructive pulmonary disease) (HCC)    Depression    Dyspnea    with 39ft of ambulation. 200 feet if he walks slow.   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;  Surgeon: Stark Klein, MD;  Location: [facility];  Service: General;  Laterality: Right;   WISDOM TOOTH EXTRACTION      Social History   Socioeconomic History   Marital status: Married    Spouse name: Not on file   Number of children: 2   Years of education: Not on file   Highest education level: Not on file  Occupational History   Occupation: Public librarian    Comment: [employer]  Tobacco Use   Smoking status: Every Day    Packs/day: 0.50    Years: 40.00    Pack years: 20.00    Types: Cigarettes    Start date: 05/17/1981   Smokeless tobacco: Never  Vaping Use   Vaping Use: Never used  Substance and Sexual Activity   Alcohol use: Yes    Alcohol/week: 6.0 standard drinks    Types: 4 Cans of beer, 2 Standard drinks or equivalent per week   Drug use: No    Sexual activity: Yes    Partners: Female    Birth control/protection: None  Other Topics Concern   Not on file  Social History Narrative   Married   2 children--- 1 daughter/1 son   6 step children   Social Determinants of Health   Financial Resource Strain: Not on file  Food Insecurity: Not on file  Transportation Needs: Not on file  Physical Activity: Not on file  Stress: Not on file  Social Connections: Not on file  Intimate Partner Violence: Not on file    Current Outpatient Medications on File Prior to Visit  Medication Sig Dispense Refill   Fluticasone-Umeclidin-Vilant (TRELEGY ELLIPTA) 100-62.5-25 MCG/INH AEPB Inhale 1 puff into the lungs daily. 60 each 6   polyvinyl alcohol (LIQUIFILM TEARS) 1.4 % ophthalmic solution Place 1 drop into both eyes as needed for dry eyes.     predniSONE (DELTASONE) 10 MG tablet Take 1 tablet (10 mg total) by mouth every morning. 90 tablet 3   prochlorperazine (COMPAZINE) 10 MG tablet Take 1 tablet (10 mg total) by mouth every 6 (six) hours as needed for nausea or vomiting. 30 tablet 0   No current facility-administered medications on file prior to visit.    Allergies  Allergen Reactions   Duloxetine  dizziness    Family History  Problem Relation Age of Onset   Stroke Mother    Dementia Mother    Breast cancer Sister    Thyroid cancer Sister    Stroke Maternal Grandmother    Breast cancer Paternal Grandmother    Thyroid disease Neg Hx     BP 102/64 (BP Location: Left Arm, Patient Position: Sitting, Cuff Size: Normal)    Pulse 72    Ht 6' (1.829 m)    Wt 166 lb 9.6 oz (75.6 kg)    SpO2 97%    BMI 22.60 kg/m    Review of Systems     Objective:   Physical Exam VITAL SIGNS:  See vs page GENERAL: no distress GAIT: slow but steady.       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  He is unable to work.   Patient  Instructions  I'll fill out the form for the month of Feb.  Please continue the same fludrocortisone and prednisone for now.   As these 2 meds more than make up for the low cortisone, you should conclude that the low cortisone does not completely explain the low blood pressure.   Please see a heart specialist.  you will receive a phone call, about a day and time for an appointment.   Please come back for a follow-up appointment in 2-3 weeks.

## 2021-07-05 ENCOUNTER — Encounter: Payer: Self-pay | Admitting: Oncology

## 2021-07-05 NOTE — Progress Notes (Addendum)
HEMATOLOGY/ONCOLOGY CLINIC NOTE  Date of Service: .06/29/2021  Patient Care Team: [name], MD as PCP - General (Internal Medicine)  CHIEF COMPLAINTS/PURPOSE OF CONSULTATION:  Follow-up for continued evaluation and management of metastatic melanoma  HISTORY OF PRESENTING ILLNESS:  Previous notes for details of initial presentation  INTERVAL HISTORY  Mr Urban Naval is here for continued evaluation and management of his metastatic melanoma.  Since his last clinic visit he has has been started on prednisone by Dr. Buddy Duty for his secondary adrenal insufficiency.  He continues to be on fludrocortisone 0.5 mcg daily.  He notes his appetite is significantly improved and he is not having any dizzy episodes as frequently. Has been increasing his intake of salty foods as recommended. He is now off midodrine.   MEDICAL HISTORY:  Past Medical History:  Diagnosis Date   Adrenal insufficiency (HCC)    Cancer (HCC)    COPD (chronic obstructive pulmonary disease) (HCC)    Depression    Dyspnea    with 51f of ambulation. 200 feet if he walks slow.   Fatty liver    History of kidney stones    seen on CT Scan    SURGICAL HISTORY: Past Surgical History:  Procedure Laterality Date   MASS EXCISION Right 12/15/2020   Procedure: EXCISION OF MALIGNANT MASS RIGHT POSTERIOR SHOULDER;  Surgeon: BStark Klein MD;  [facility] [facility]  Service: General;  Laterality: Right;   WISDOM TOOTH EXTRACTION      SOCIAL HISTORY: Social History   Socioeconomic History   Marital status: Married    Spouse name: Not on file   Number of children: 2   Years of education: Not on file   Highest education level: Not on file  Occupational History   Occupation: PPublic librarian   Comment: [employer]  Tobacco Use   Smoking status: Every Day    Packs/day: 0.50    Years: 40.00    Pack years: 20.00    Types: Cigarettes    Start date: 05/17/1981   Smokeless tobacco: Never  Vaping  Use   Vaping Use: Never used  Substance and Sexual Activity   Alcohol use: Yes    Alcohol/week: 6.0 standard drinks    Types: 4 Cans of beer, 2 Standard drinks or equivalent per week   Drug use: No   Sexual activity: Yes    Partners: Female    Birth control/protection: None  Other Topics Concern   Not on file  Social History Narrative   Married   2 children--- 1 daughter/1 son   6 step children   Social Determinants of Health   Financial Resource Strain: Not on file  Food Insecurity: Not on file  Transportation Needs: Not on file  Physical Activity: Not on file  Stress: Not on file  Social Connections: Not on file  Intimate Partner Violence: Not on file    FAMILY HISTORY: Family History  Problem Relation Age of Onset   Stroke Mother    Dementia Mother    Breast cancer Sister    Thyroid cancer Sister    Stroke Maternal Grandmother    Breast cancer Paternal Grandmother    Thyroid disease Neg Hx     ALLERGIES:  is allergic to duloxetine.  MEDICATIONS:  Current Outpatient Medications  Medication Sig Dispense Refill   Fluticasone-Umeclidin-Vilant (TRELEGY ELLIPTA) 100-62.5-25 MCG/INH AEPB Inhale 1 puff into the lungs daily. 60 each 6   polyvinyl alcohol (LIQUIFILM TEARS) 1.4 % ophthalmic solution  Place 1 drop into both eyes as needed for dry eyes.     predniSONE (DELTASONE) 10 MG tablet Take 1 tablet (10 mg total) by mouth every morning. 90 tablet 3   prochlorperazine (COMPAZINE) 10 MG tablet Take 1 tablet (10 mg total) by mouth every 6 (six) hours as needed for nausea or vomiting. 30 tablet 0   fludrocortisone (FLORINEF) 0.1 MG tablet Take 1 tablet (0.1 mg total) by mouth daily. 90 tablet 0   methimazole (TAPAZOLE) 10 MG tablet Take 1 tablet (10 mg total) by mouth 2 (two) times daily. 180 tablet 1   No current facility-administered medications for this visit.    REVIEW OF SYSTEMS:   10 Point review of Systems was done is negative except as noted above.  PHYSICAL  EXAMINATION: ECOG PERFORMANCE STATUS: 1 - Symptomatic but completely ambulatory  .BP 129/77 (BP Location: Left Arm, Patient Position: Sitting)    Pulse 63    Temp 97.9 F (36.6 C) (Temporal)    Resp 17    Ht 6' (1.829 m)    Wt 169 lb (76.7 kg)    SpO2 97%    BMI 22.92 kg/m  NAD GENERAL:alert, in no acute distress and comfortable SKIN: no acute rashes, no significant lesions EYES: conjunctiva are pink and non-injected, sclera anicteric OROPHARYNX: MMM, no exudates, no oropharyngeal erythema or ulceration NECK: supple, no JVD LYMPH:  no palpable lymphadenopathy in the cervical, axillary or inguinal regions LUNGS: clear to auscultation b/l with normal respiratory effort HEART: regular rate & rhythm ABDOMEN:  normoactive bowel sounds , non tender, not distended. Extremity: no pedal edema PSYCH: alert & oriented x 3 with fluent speech NEURO: no focal motor/sensory deficits   LABORATORY DATA:  I have reviewed the data as listed  . CBC Latest Ref Rng & Units 06/29/2021 06/17/2021 06/16/2021  WBC 4.0 - 10.5 K/uL 9.8 6.6 5.8  Hemoglobin 13.0 - 17.0 g/dL 13.6 13.2 11.6(L)  Hematocrit 39.0 - 52.0 % 40.2 39.3 36.7(L)  Platelets 150 - 400 K/uL 363 219 189   . CMP Latest Ref Rng & Units 06/29/2021 06/23/2021 06/17/2021  Glucose 70 - 99 mg/dL 113(H) 109(H) 84  BUN 8 - 23 mg/dL _0 Creatinine 0.61 - 1.24 mg/dL 0.99 1.30 1.17  Sodium 135 - 145 mmol/L 139 135 138  Potassium 3.5 - 5.1 mmol/L 3.7 4.6 3.6  Chloride 98 - 111 mmol/L 106 102 106  CO2 22 - 32 mmol/L _1 Calcium 8.9 - 10.3 mg/dL 8.8(L) 9.3 9.7  Total Protein 6.5 - 8.1 g/dL 6.9 - 6.7  Total Bilirubin 0.3 - 1.2 mg/dL 0.4 - 1.0  Alkaline Phos 38 - 126 U/L 108 - 106  AST 15 - 41 U/L 28 - 15  ALT 0 - 44 U/L 33 - 7    SURGICAL PATHOLOGY  CASE: [id number]  PATIENT: Arun Coger  Surgical Pathology Report   Clinical History: right posterior shoulder soft tissue mass (cm)   FINAL MICROSCOPIC DIAGNOSIS:   A. SOFT TISSUE  MASS, RIGHT POSTERIOR SHOULDER, NEEDLE CORE BIOPSY:  -  Malignant melanoma  -  See comment   COMMENT:   By immunohistochemistry, the neoplastic cells are positive for S100,  HMB45, Sox 10 and Melan-A (patchy) but negative for CD45, CD138,  cytokeratin 7, cytokeratin 20, cytokeratin AE1/3, desmin and EMA.  The  morphology and immunophenotype are consistent with malignant melanoma.  Dr. Irene Limbo was notified of these results on November 24, 2020.  Dr. Saralyn Pilar  reviewed the case and agrees with the above diagnosis.    RADIOGRAPHIC STUDIES: I have personally reviewed the radiological images as listed and agreed with the findings in the report. CT Head Wo Contrast  Result Date: 06/15/2021 CLINICAL DATA:  Syncope/presyncope, cerebrovascular cause suspected. 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 EXAM: CT HEAD WITHOUT CONTRAST TECHNIQUE: Contiguous axial images were obtained from the base of the skull through the vertex without intravenous contrast. RADIATION DOSE REDUCTION: This exam was performed according to the departmental dose-optimization program which includes automated exposure control, adjustment of the mA and/or kV according to patient size and/or use of iterative reconstruction technique. COMPARISON:  Brain MRI 05/14/2021.  Brain MRI 12/19/2020. FINDINGS: Brain: Cerebral volume is normal. Mild patchy and ill-defined hypoattenuation within the cerebral white matter, nonspecific but compatible with chronic small vessel ischemic disease. The subcentimeter lesion within the pituitary gland described on the prior brain MRI of 05/14/2021 is occult by CT. There is no acute infarct. No chronic intracranial blood products. No extra-axial fluid collection. No midline shift. Vascular: No hyperdense vessel.  Atherosclerotic calcifications. Skull: Normal. Negative for fracture or focal lesion. Sinuses/Orbits: Visualized orbits show no acute finding. No significant paranasal sinus  disease at the imaged levels. IMPRESSION: No evidence of acute intracranial hemorrhage or acute infarct. The 4 mm pituitary gland lesion described on the prior brain MRI of 05/14/2021 is occult by CT. Mild chronic small vessel ischemic changes within the cerebral white matter. Please note a brain MRI with contrast would have greater sensitivity for intracranial metastatic disease. Electronically Signed   By: Kellie Simmering D.O.   On: 06/15/2021 12:18   CT Angio Chest PE W and/or Wo Contrast  Result Date: 06/15/2021 CLINICAL DATA:  Chest pain, positive D-dimer EXAM: CT ANGIOGRAPHY CHEST WITH CONTRAST TECHNIQUE: Multidetector CT imaging of the chest was performed using the standard protocol during bolus administration of intravenous contrast. Multiplanar CT image reconstructions and MIPs were obtained to evaluate the vascular anatomy. RADIATION DOSE REDUCTION: This exam was performed according to the departmental dose-optimization program which includes automated exposure control, adjustment of the mA and/or kV according to patient size and/or use of iterative reconstruction technique. CONTRAST:  119m OMNIPAQUE IOHEXOL 350 MG/ML SOLN COMPARISON:  Previous CT done on 06/23/2020, chest radiograph done on 06/15/2021 FINDINGS: Cardiovascular: Contrast density in the thoracic aorta is less than adequate to evaluate for dissection. There are no intraluminal filling defects in pulmonary artery branches. Mediastinum/Nodes: Slightly enlarged lymph nodes are seen in the mediastinum and hilar regions. Lungs/Pleura: There is no focal pulmonary consolidation. There are no signs of pulmonary edema. Slightly increased interstitial markings are seen in the subpleural location in the posterior lower lung fields. There is no pleural effusion or pneumothorax. Upper Abdomen: There are foci of cortical thinning in the right kidney suggesting scarring from chronic pyelonephritis. There is minimal stranding in the fat planes adjacent to  the body and tail of pancreas without any loculated fluid collections. Musculoskeletal: Unremarkable. Review of the MIP images confirms the above findings. IMPRESSION: There is no evidence of pulmonary artery embolism. There is no focal pulmonary consolidation. Subtle increased interstitial markings are seen in the posterior lower lung fields suggesting scarring or interstitial pneumonitis. There is mild stranding in the fat planes adjacent to body and tail of pancreas. This may be residual scarring from previous inflammation or suggest mild acute pancreatitis. Please correlate with laboratory findings. There are multiple foci of cortical thinning in the right kidney suggesting scarring  from chronic pyelonephritis. Electronically Signed   By: Elmer Picker M.D.   On: 06/15/2021 16:16   DG Chest Portable 1 View  Result Date: 06/15/2021 CLINICAL DATA:  Loss of consciousness. EXAM: PORTABLE CHEST 1 VIEW COMPARISON:  02/23/2020 FINDINGS: The heart size and mediastinal contours are within normal limits. Both lungs are clear. The visualized skeletal structures are unremarkable. IMPRESSION: No active disease. Electronically Signed   By: Kerby Moors M.D.   On: 06/15/2021 11:09   ECHOCARDIOGRAM COMPLETE  Result Date: 06/16/2021    ECHOCARDIOGRAM REPORT   Patient Name:   AYIDEN MILLIMAN Date of Exam: 06/16/2021 Medical Rec #:  [id number]       Height:       72.0 in Accession #:    [id number]      Weight:       170.0 lb Date of Birth:  26-Aug-1960       BSA:          1.988 m Patient Age:    61 years        BP:           99/73 mmHg Patient Gender: M               HR:           74 bpm. Exam Location:  Inpatient Procedure: 2D Echo Indications:    Syncope  History:        Patient has prior history of Echocardiogram examinations, most                 recent 03/03/2020. COPD.  Sonographer:    Jefferey Pica Referring Phys: [id number] [place]  1. Left ventricular ejection fraction, by estimation, is  60 to 65%. The left ventricle has normal function. The left ventricle has no regional wall motion abnormalities. Left ventricular diastolic parameters are consistent with Grade II diastolic dysfunction (pseudonormalization).  2. Right ventricular systolic function is normal. The right ventricular size is normal. There is normal pulmonary artery systolic pressure. The estimated right ventricular systolic pressure is 62.0 mmHg.  3. The mitral valve is normal in structure. Trivial mitral valve regurgitation. No evidence of mitral stenosis.  4. The aortic valve is tricuspid. There is mild calcification of the aortic valve. Aortic valve regurgitation is not visualized. Aortic valve sclerosis is present, with no evidence of aortic valve stenosis.  5. The inferior vena cava is normal in size with greater than 50% respiratory variability, suggesting right atrial pressure of 3 mmHg. Comparison(s): No significant change from prior study. Prior images reviewed side by side. FINDINGS  Left Ventricle: Left ventricular ejection fraction, by estimation, is 60 to 65%. The left ventricle has normal function. The left ventricle has no regional wall motion abnormalities. The left ventricular internal cavity size was normal in size. There is  no left ventricular hypertrophy. Left ventricular diastolic parameters are consistent with Grade II diastolic dysfunction (pseudonormalization). Right Ventricle: The right ventricular size is normal. No increase in right ventricular wall thickness. Right ventricular systolic function is normal. There is normal pulmonary artery systolic pressure. The tricuspid regurgitant velocity is 2.24 m/s, and  with an assumed right atrial pressure of 5 mmHg, the estimated right ventricular systolic pressure is 35.5 mmHg. Left Atrium: Left atrial size was normal in size. Right Atrium: Right atrial size was normal in size. Pericardium: There is no evidence of pericardial effusion. Mitral Valve: The mitral valve  is normal in structure. Trivial mitral valve regurgitation. No evidence  of mitral valve stenosis. Tricuspid Valve: The tricuspid valve is normal in structure. Tricuspid valve regurgitation is mild . No evidence of tricuspid stenosis. Aortic Valve: The aortic valve is tricuspid. There is mild calcification of the aortic valve. Aortic valve regurgitation is not visualized. Aortic valve sclerosis is present, with no evidence of aortic valve stenosis. Aortic valve peak gradient measures 7.1 mmHg. Pulmonic Valve: The pulmonic valve was normal in structure. Pulmonic valve regurgitation is not visualized. No evidence of pulmonic stenosis. Aorta: The aortic root is normal in size and structure. Venous: The inferior vena cava is normal in size with greater than 50% respiratory variability, suggesting right atrial pressure of 3 mmHg. IAS/Shunts: No atrial level shunt detected by color flow Doppler.  LEFT VENTRICLE PLAX 2D LVIDd:         4.50 cm   Diastology LVIDs:         2.90 cm   LV e' medial:    6.43 cm/s LV PW:         1.10 cm   LV E/e' medial:  14.9 LV IVS:        1.00 cm   LV e' lateral:   16.20 cm/s LVOT diam:     2.00 cm   LV E/e' lateral: 5.9 LV SV:         93 LV SV Index:   47 LVOT Area:     3.14 cm  RIGHT VENTRICLE             IVC RV Basal diam:  2.60 cm     IVC diam: 1.90 cm RV S prime:     15.10 cm/s TAPSE (M-mode): 2.6 cm LEFT ATRIUM             Index        RIGHT ATRIUM           Index LA diam:        3.40 cm 1.71 cm/m   RA Area:     13.20 cm LA Vol (A2C):   40.7 ml 20.47 ml/m  RA Volume:   32.20 ml  16.19 ml/m LA Vol (A4C):   26.4 ml 13.28 ml/m LA Biplane Vol: 34.6 ml 17.40 ml/m  AORTIC VALVE                 PULMONIC VALVE AV Area (Vmax): 3.11 cm     PV Vmax:       0.90 m/s AV Vmax:        133.50 cm/s  PV Peak grad:  3.3 mmHg AV Peak Grad:   7.1 mmHg LVOT Vmax:      132.00 cm/s LVOT Vmean:     90.400 cm/s LVOT VTI:       0.296 m  AORTA Ao Root diam: 3.30 cm Ao Asc diam:  3.20 cm MITRAL VALVE                TRICUSPID VALVE MV Area (PHT): 3.48 cm    TR Peak grad:   20.1 mmHg MV Decel Time: 218 msec    TR Vmax:        224.00 cm/s MV E velocity: 96.00 cm/s MV A velocity: 79.30 cm/s  SHUNTS MV E/A ratio:  1.21        Systemic VTI:  0.30 m                            Systemic Diam: 2.00 cm [employer]  MD Electronically signed by Candee Furbish MD Signature Date/Time: 06/16/2021/10:42:40 AM    Final      ASSESSMENT & PLAN:   61 yo with   1) 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  Unclear primary site with the patient presented with a metastatic deposit in the soft tissue/skin over the right posterior shoulder. Patient also has multiple slightly hypermetabolic lymph nodes-right axillary, subpectoral and right cervical.  These could be from metastatic melanoma or a possible indolent lymphoma related to his IgM kappa monoclonal paraproteinemia. -Molecular testing was sent on the pathology sample and is BRAF positive -PD-L1 testing positive for PD-L1 expression at 1%  3) grade 1 diarrhea likely from ipilimumab plus nivolumab.  Resolved of Ipilimumab. 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 -He is okay from endocrinology standpoint and oncology standpoint to proceed with his next cycle of nivolumab for his metastatic melanoma. -Better p.o. intake and was encouraged to continue this. -He was recommended to consume at least 2 L of water daily. -Continue follow-up with endocrinology to continue to optimize management of thyroiditis and secondary adrenal insufficiency.  No orders of the defined types were placed in this encounter.   FOLLOW UP Please schedule next 4 doses of  nivolumab every 2 weeks Labs with each treatment MD visit in 4 weeks  All of the patient's questions were answered with apparent satisfaction. The patient knows to call the clinic with any problems, questions or concerns.   Sullivan Lone MD [place] AAHIVMS [facility] Hematology/Oncology Physician [facility]

## 2021-07-07 ENCOUNTER — Encounter: Payer: Self-pay | Admitting: Hematology

## 2021-07-08 ENCOUNTER — Telehealth: Payer: Self-pay

## 2021-07-08 ENCOUNTER — Encounter: Payer: Self-pay | Admitting: Hematology

## 2021-07-08 ENCOUNTER — Encounter: Payer: Self-pay | Admitting: Endocrinology

## 2021-07-08 NOTE — Telephone Encounter (Signed)
Phone call received from Spouse regarding Patient's Disability Claim. Explained to Spouse that forms requesting medical records were addressed to the Provider rather than [facility] as required by [place] Information Management Office. Release of Information Form was signed in January. Explained to Spouse that when records are released from [facility] that records are only released up to the date of signature on the form. Release of Information Form emailed to Patient at e-mail provided by spouse ([email or web address]). Spouse stated that she would call [facility] regarding form with medical records request. No other needs voiced at this time.

## 2021-07-10 ENCOUNTER — Other Ambulatory Visit: Payer: Self-pay

## 2021-07-10 DIAGNOSIS — C4359 Malignant melanoma of other part of trunk: Secondary | ICD-10-CM

## 2021-07-10 DIAGNOSIS — E230 Hypopituitarism: Secondary | ICD-10-CM

## 2021-07-13 ENCOUNTER — Other Ambulatory Visit: Payer: Self-pay

## 2021-07-13 ENCOUNTER — Telehealth: Payer: Self-pay

## 2021-07-13 ENCOUNTER — Inpatient Hospital Stay: Payer: BC Managed Care – PPO

## 2021-07-13 ENCOUNTER — Other Ambulatory Visit: Payer: Self-pay | Admitting: Internal Medicine

## 2021-07-13 VITALS — BP 126/80 | HR 62 | Temp 98.1°F | Resp 17 | Ht 72.0 in | Wt 169.8 lb

## 2021-07-13 DIAGNOSIS — Z5112 Encounter for antineoplastic immunotherapy: Secondary | ICD-10-CM | POA: Diagnosis not present

## 2021-07-13 DIAGNOSIS — C799 Secondary malignant neoplasm of unspecified site: Secondary | ICD-10-CM | POA: Diagnosis not present

## 2021-07-13 DIAGNOSIS — E230 Hypopituitarism: Secondary | ICD-10-CM

## 2021-07-13 DIAGNOSIS — C4359 Malignant melanoma of other part of trunk: Secondary | ICD-10-CM

## 2021-07-13 DIAGNOSIS — C4360 Malignant melanoma of unspecified upper limb, including shoulder: Secondary | ICD-10-CM | POA: Diagnosis not present

## 2021-07-13 DIAGNOSIS — Z79899 Other long term (current) drug therapy: Secondary | ICD-10-CM | POA: Diagnosis not present

## 2021-07-13 LAB — CBC WITH DIFFERENTIAL (CANCER CENTER ONLY)
Abs Immature Granulocytes: 0.03 10*3/uL (ref 0.00–0.07)
Basophils Absolute: 0.1 10*3/uL (ref 0.0–0.1)
Basophils Relative: 1 %
Eosinophils Absolute: 0.5 10*3/uL (ref 0.0–0.5)
Eosinophils Relative: 5 %
HCT: 40.9 % (ref 39.0–52.0)
Hemoglobin: 13.5 g/dL (ref 13.0–17.0)
Immature Granulocytes: 0 %
Lymphocytes Relative: 41 %
Lymphs Abs: 4 10*3/uL (ref 0.7–4.0)
MCH: 31.1 pg (ref 26.0–34.0)
MCHC: 33 g/dL (ref 30.0–36.0)
MCV: 94.2 fL (ref 80.0–100.0)
Monocytes Absolute: 0.7 10*3/uL (ref 0.1–1.0)
Monocytes Relative: 7 %
Neutro Abs: 4.4 10*3/uL (ref 1.7–7.7)
Neutrophils Relative %: 46 %
Platelet Count: 215 10*3/uL (ref 150–400)
RBC: 4.34 MIL/uL (ref 4.22–5.81)
RDW: 13.9 % (ref 11.5–15.5)
WBC Count: 9.7 10*3/uL (ref 4.0–10.5)
nRBC: 0 % (ref 0.0–0.2)

## 2021-07-13 LAB — CMP (CANCER CENTER ONLY)
ALT: 18 U/L (ref 0–44)
AST: 17 U/L (ref 15–41)
Albumin: 4 g/dL (ref 3.5–5.0)
Alkaline Phosphatase: 98 U/L (ref 38–126)
Anion gap: 7 (ref 5–15)
BUN: 10 mg/dL (ref 8–23)
CO2: 24 mmol/L (ref 22–32)
Calcium: 9.3 mg/dL (ref 8.9–10.3)
Chloride: 110 mmol/L (ref 98–111)
Creatinine: 0.92 mg/dL (ref 0.61–1.24)
GFR, Estimated: >60 mL/min (ref >60)
Glucose, Bld: 107 mg/dL — ABNORMAL HIGH (ref 70–99)
Potassium: 4 mmol/L (ref 3.5–5.1)
Sodium: 141 mmol/L (ref 135–145)
Total Bilirubin: 0.4 mg/dL (ref 0.3–1.2)
Total Protein: 7.2 g/dL (ref 6.5–8.1)

## 2021-07-13 LAB — TSH: TSH: 2.722 u[IU]/mL (ref 0.320–4.118)

## 2021-07-13 MED ORDER — SODIUM CHLORIDE 0.9 % IV SOLN
240.0000 mg | Freq: Once | INTRAVENOUS | Status: AC
  Administered 2021-07-13: 240 mg via INTRAVENOUS
  Filled 2021-07-13: qty 24

## 2021-07-13 MED ORDER — SODIUM CHLORIDE 0.9 % IV SOLN: Freq: Once | INTRAVENOUS | Status: AC

## 2021-07-13 NOTE — Telephone Encounter (Signed)
Notified Patient of completion of Disability paperwork. Request for medical records and signed Release of information Form forwarded to [facility] Management Department. Copy of Forms placed for pick-up as requested by Patient.

## 2021-07-13 NOTE — Patient Instructions (Signed)
[facility]ICAL [facility]COL[facility]   ?Discharge Instructions: ?Thank you for choosing [facility] to provide your oncology and hematology care.  ? ?If you have a lab appointment with the Cancer Center, please go directly to the Cancer Center and check in at the registration area. ?  ?Wear comfortable clothing and clothing appropriate for easy access to any Portacath or PICC line.  ? ?We strive to give you quality time with your provider. You may need to reschedule your appointment if you arrive late (15 or more minutes).  Arriving late affects you and other patients whose appointments are after yours.  Also, if you miss three or more appointments without notifying the office, you may be dismissed from the clinic at the provider?s discretion.    ?  ?For prescription refill requests, have your pharmacy contact our office and allow 72 hours for refills to be completed.   ? ?Today you received the following chemotherapy and/or immunotherapy agents: Nivolumab (Opdivo)    ?  ?To help prevent nausea and vomiting after your treatment, we encourage you to take your nausea medication as directed. ? ?BELOW ARE SYMPTOMS THAT SHOULD BE REPORTED IMMEDIATELY: ?*FEVER GREATER THAN 100.4 F (38 ?C) OR HIGHER ?*CHILLS OR SWEATING ?*NAUSEA AND VOMITING THAT IS NOT CONTROLLED WITH YOUR NAUSEA MEDICATION ?*UNUSUAL SHORTNESS OF BREATH ?*UNUSUAL BRUISING OR BLEEDING ?*URINARY PROBLEMS (pain or burning when urinating, or frequent urination) ?*BOWEL PROBLEMS (unusual diarrhea, constipation, pain near the anus) ?TENDERNESS IN MOUTH AND THROAT WITH OR WITHOUT PRESENCE OF ULCERS (sore throat, sores in mouth, or a toothache) ?UNUSUAL RASH, SWELLING OR PAIN  ?UNUSUAL VAGINAL DISCHARGE OR ITCHING  ? ?Items with * indicate a potential emergency and should be followed up as soon as possible or go to the Emergency Department if any problems should occur. ? ?Please show the CHEMOTHERAPY ALERT CARD or IMMUNOTHERAPY ALERT CARD at  check-in to the Emergency Department and triage nurse. ? ?Should you have questions after your visit or need to cancel or reschedule your appointment, please contact [facility]  Dept: [phone]  and follow the prompts.  Office hours are 8:00 a.m. to 4:30 p.m. Monday - Friday. Please note that voicemails left after 4:00 p.m. may not be returned until the following business day.  We are closed weekends and major holidays. You have access to a nurse at all times for urgent questions. Please call the main number to the clinic Dept: [phone] and follow the prompts. ? ? ?For any non-urgent questions, you may also contact your provider using MyChart. We now offer e-Visits for anyone 18 and older to request care online for non-urgent symptoms. For details visit [email or web address]. ?  ?Also download the MyChart app! Go to the app store, search "MyChart", open the app, select , and log in with your MyChart username and password. ? ?Due to Covid, a mask is required upon entering the hospital/clinic. If you do not have a mask, one will be given to you upon arrival. For doctor visits, patients may have 1 support person aged 18 or older with them. For treatment visits, patients cannot have anyone with them due to current Covid guidelines and our immunocompromised population.  ? ?

## 2021-07-17 ENCOUNTER — Other Ambulatory Visit: Payer: Self-pay

## 2021-07-17 ENCOUNTER — Ambulatory Visit (INDEPENDENT_AMBULATORY_CARE_PROVIDER_SITE_OTHER): Payer: BC Managed Care – PPO | Admitting: Endocrinology

## 2021-07-17 VITALS — BP 130/84 | HR 81 | Ht 72.0 in | Wt 171.8 lb

## 2021-07-17 DIAGNOSIS — E0590 Thyrotoxicosis, unspecified without thyrotoxic crisis or storm: Secondary | ICD-10-CM

## 2021-07-17 DIAGNOSIS — E230 Hypopituitarism: Secondary | ICD-10-CM

## 2021-07-17 MED ORDER — PREDNISONE 5 MG PO TABS: 5.0000 mg | ORAL_TABLET | Freq: Every day | ORAL | 3 refills | Status: DC

## 2021-07-17 NOTE — Patient Instructions (Addendum)
Please continue the same fludrocortisone for now.  ?I have sent a prescription to your pharmacy, to reduce the prednisone to 5 mg per day.    ?Please see the heart specialist as scheduled.   ?Please come back for a follow-up appointment in 1 month.  ?

## 2021-07-17 NOTE — Progress Notes (Signed)
? ?Subjective:  ? ? Patient ID: George West, male    DOB: [birth year], 61 y.o.   MRN: [id number] ? 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 ?ACTH: on prednisone  ?Prolactin: borderline high.  ?FSH/LH: low ?TSH: primary hyperthyroidism ?VP: USG and urine Na+ are normal ?pt states he feels better in general.  In particular, lightheadedness is improved but not resolved.  Wife checks BP at home, but pt says he does not know what is has been.   ?Past Medical History:  ?Diagnosis Date  ? Adrenal insufficiency ([place])   ? Cancer [facility])   ? COPD (chronic obstructive pulmonary disease) ([place])   ? Depression   ? Dyspnea   ? with 64ft of ambulation. 200 feet if he walks slow.  ? Fatty liver   ? History of kidney stones   ? seen on CT Scan  ? ? ?Past Surgical History:  ?Procedure Laterality Date  ? MASS EXCISION Right 12/15/2020  ? Procedure: EXCISION OF MALIGNANT MASS RIGHT POSTERIOR SHOULDER;  Surgeon: Stark Klein, MD;  Location: [facility];  Service: General;  Laterality: Right;  ? WISDOM TOOTH EXTRACTION    ? ? ?Social History  ? ?Socioeconomic History  ? Marital status: Married  ?  Spouse name: Not on file  ? Number of children: 2  ? Years of education: Not on file  ? Highest education level: Not on file  ?Occupational History  ? Occupation: Public librarian  ?  Comment: [employer]  ?Tobacco Use  ? Smoking status: Every Day  ?  Packs/day: 0.50  ?  Years: 40.00  ?  Pack years: 20.00  ?  Types: Cigarettes  ?  Start date: 05/17/1981  ? Smokeless tobacco: Never  ?Vaping Use  ? Vaping Use: Never used  ?Substance and Sexual Activity  ? Alcohol use: Yes  ?  Alcohol/week: 6.0 standard drinks  ?  Types: 4 Cans of beer, 2 Standard drinks or equivalent per week  ? Drug use: No  ? Sexual activity: Yes  ?  Partners: Female  ?  Birth control/protection: None   ?Other Topics Concern  ? Not on file  ?Social History Narrative  ? Married  ? 2 children--- 1 daughter/1 son  ? 6 step children  ? ?Social Determinants of Health  ? ?Financial Resource Strain: Not on file  ?Food Insecurity: Not on file  ?Transportation Needs: Not on file  ?Physical Activity: Not on file  ?Stress: Not on file  ?Social Connections: Not on file  ?Intimate Partner Violence: Not on file  ? ? ?Current Outpatient Medications on File Prior to Visit  ?Medication Sig Dispense Refill  ? fludrocortisone (FLORINEF) 0.1 MG tablet Take 1 tablet (0.1 mg total) by mouth daily. 90 tablet 0  ? Fluticasone-Umeclidin-Vilant (TRELEGY ELLIPTA) 100-62.5-25 MCG/INH AEPB Inhale 1 puff into the lungs daily. 60 each 6  ? methimazole (TAPAZOLE) 10 MG tablet Take 1 tablet (10 mg total) by mouth 2 (two) times daily. 180 tablet 1  ? polyvinyl alcohol (LIQUIFILM TEARS) 1.4 % ophthalmic solution Place 1 drop into both eyes as needed for dry eyes.    ? prochlorperazine (COMPAZINE) 10 MG tablet Take 1 tablet (10 mg total) by mouth every 6 (six) hours as needed for nausea or vomiting. 30 tablet 0  ? ?No current facility-administered medications on file prior to visit.  ? ? ?  Allergies  ?Allergen Reactions  ? Duloxetine   ?  dizziness  ? ? ?Family History  ?Problem Relation Age of Onset  ? Stroke Mother   ? Dementia Mother   ? Breast cancer Sister   ? Thyroid cancer Sister   ? Stroke Maternal Grandmother   ? Breast cancer Paternal Grandmother   ? Thyroid disease Neg Hx   ? ? ?BP 130/84   Pulse 81   Ht 6' (1.829 m)   Wt 171 lb 12.8 oz (77.9 kg)   SpO2 94%   BMI 23.30 kg/m?  ? ? ?Review of Systems ?Denies LOC ?   ?Objective:  ? Physical Exam ?VITAL SIGNS:  See vs page.   ?GENERAL: no distress. ? ? ?Lab Results  ?Component Value Date  ? TSH 2.722 07/13/2021  ? ?   ?Assessment & Plan:  ?Pit insuff: well-controlled.  He is ready to reduce prednisone.   ?Hypotension, not caused by the above.  ? ?Patient Instructions  ?Please continue the  same fludrocortisone for now.  ?I have sent a prescription to your pharmacy, to reduce the prednisone to 5 mg per day.    ?Please see the heart specialist as scheduled.   ?Please come back for a follow-up appointment in 1 month.  ? ? ?

## 2021-07-18 DIAGNOSIS — E230 Hypopituitarism: Secondary | ICD-10-CM | POA: Insufficient documentation

## 2021-07-22 ENCOUNTER — Telehealth: Payer: Self-pay

## 2021-07-22 ENCOUNTER — Ambulatory Visit: Payer: BC Managed Care – PPO | Admitting: Endocrinology

## 2021-07-22 NOTE — Telephone Encounter (Signed)
Notified Patient of completed Disability form as requested. Fax transmission confirmation received. Copy of form mailed to Patient as requested. No other concerns or needs voiced at this time. ?

## 2021-07-23 ENCOUNTER — Other Ambulatory Visit: Payer: Self-pay | Admitting: Oncology

## 2021-07-24 ENCOUNTER — Ambulatory Visit (INDEPENDENT_AMBULATORY_CARE_PROVIDER_SITE_OTHER): Payer: BC Managed Care – PPO | Admitting: Cardiovascular Disease

## 2021-07-24 ENCOUNTER — Encounter: Payer: Self-pay | Admitting: Cardiovascular Disease

## 2021-07-24 ENCOUNTER — Other Ambulatory Visit: Payer: Self-pay

## 2021-07-24 VITALS — BP 122/62 | HR 73 | Ht 72.0 in | Wt 172.4 lb

## 2021-07-24 DIAGNOSIS — R55 Syncope and collapse: Secondary | ICD-10-CM

## 2021-07-24 DIAGNOSIS — R0609 Other forms of dyspnea: Secondary | ICD-10-CM | POA: Diagnosis not present

## 2021-07-24 NOTE — Patient Instructions (Signed)
Medication Instructions:  ?Your physician recommends that you continue on your current medications as directed. Please refer to the Current Medication list given to you today. ? ?*If you need a refill on your cardiac medications before your next appointment, please call your pharmacy* ? ? ?Lab Work: ?NONE ?If you have labs (blood work) drawn today and your tests are completely normal, you will receive your results only by: ?MyChart Message (if you have MyChart) OR ?A paper copy in the mail ?If you have any lab test that is abnormal or we need to change your treatment, we will call you to review the results. ? ? ?Testing/Procedures: ?Your physician has requested that you have a lexiscan myoview. For further information please visit [email or web address]. Please follow instruction sheet, as given.  ? ? ?Follow-Up: ?At [facility], you and your health needs are our priority.  As part of our continuing mission to provide you with exceptional heart care, we have created designated Provider Care Teams.  These Care Teams include your primary Cardiologist (physician) and Advanced Practice Providers (APPs -  Physician Assistants and Nurse Practitioners) who all work together to provide you with the care you need, when you need it. ? ?Your next appointment:   ?As needed ? ?The format for your next appointment:   ?In Person ? ?Provider:   ?Mertie Moores, MD  ?

## 2021-07-24 NOTE — Progress Notes (Signed)
Cardiology Office Note:    Date:  07/24/2021   ID:  George West, DOB 1960-09-14, MRN [id number]  PCP:  Karie Schwalbe, MD   [facility] Providers Cardiologist:  Kristeen Miss, MD {   Referring MD: Romero Belling, MD   Chief Complaint  Patient presents with   Hypotension    History of Present Illness:    Seen with wife, George West is a 61 y.o. 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  Since starting the prednisone he symptoms have improved significantly.  He still has rare episodes of dizziness but has not had any passout episodes since being on prednisone.  Was originally started on Cortef on Jan. 6, 2023  ( before Florinef according to wife)  Dr. Everardo All stopped cortef, started Florinef Prednisone was added back several weeks   He denies any chest pain  Wife reminded him of an episode of CP 6 weeks ago  No family hx of CAD , but + for strokes,   Has midodrine for as needed.   Has hyperthyroidism.  Echocardiogram performed June 16, 2021 shows normal left ventricular systolic function.  He has grade 2 diastolic dysfunction. Mild calcification of the aortic valve without any aortic stenosis.  Wife is a Engineer, civil (consulting),  she checks his BP daily .   Diagnosed with pit insufficiency in Dec. 2022  No real exercise  Works for [employer],  sells parts    Past Medical History:  Diagnosis Date   Adrenal insufficiency ([facility])    Cancer ([facility])    COPD (chronic obstructive pulmonary disease) ([facility])    Depression    Dyspnea    with 24ft of ambulation. 200 feet if he walks slow.   Fatty liver    History of kidney stones    seen on CT Scan    Past Surgical History:  Procedure Laterality Date    MASS EXCISION Right 12/15/2020   Procedure: EXCISION OF MALIGNANT MASS RIGHT POSTERIOR SHOULDER;  Surgeon: Almond Lint, MD;  Location: [facility];  Service: General;  Laterality: Right;   WISDOM TOOTH EXTRACTION      Current Medications: Current Meds  Medication Sig   fludrocortisone (FLORINEF) 0.1 MG tablet Take 1 tablet (0.1 mg total) by mouth daily.   Fluticasone-Umeclidin-Vilant (TRELEGY ELLIPTA) 100-62.5-25 MCG/INH AEPB Inhale 1 puff into the lungs daily.   methimazole (TAPAZOLE) 10 MG tablet Take 1 tablet (10 mg total) by mouth 2 (two) times daily.   polyvinyl alcohol (LIQUIFILM TEARS) 1.4 % ophthalmic solution Place 1 drop into both eyes as needed for dry eyes.   predniSONE (DELTASONE) 5 MG tablet Take 1 tablet (5 mg total) by mouth daily with breakfast.   prochlorperazine (COMPAZINE) 10 MG tablet Take 1 tablet (10 mg total) by mouth every 6 (six) hours as needed for nausea or vomiting.     Allergies:   Duloxetine   Social History   Socioeconomic History   Marital status: Married    Spouse name: Not on file   Number of children: 2   Years of education: Not on file   Highest education level: Not on file  Occupational History   Occupation: Public librarian    Comment: [name]  Tobacco Use  Smoking status: Every Day    Packs/day: 0.50    Years: 40.00    Pack years: 20.00    Types: Cigarettes    Start date: 05/17/1981   Smokeless tobacco: Never  Vaping Use   Vaping Use: Never used  Substance and Sexual Activity   Alcohol use: Yes    Alcohol/week: 6.0 standard drinks    Types: 4 Cans of beer, 2 Standard drinks or equivalent per week   Drug use: No   Sexual activity: Yes    Partners: Female    Birth control/protection: None  Other Topics Concern   Not on file  Social History Narrative   Married   2 children--- 1 daughter/1 son   6 step children   Social Determinants of Health   Financial Resource Strain: Not on file  Food Insecurity: Not on file   Transportation Needs: Not on file  Physical Activity: Not on file  Stress: Not on file  Social Connections: Not on file     Family History: The patient's family history includes Breast cancer in his paternal grandmother and sister; Dementia in his mother; Stroke in his maternal grandmother and mother; Thyroid cancer in his sister. There is no history of Thyroid disease.  ROS:   Please see the history of present illness.     All other systems reviewed and are negative.  EKGs/Labs/Other Studies Reviewed:    The following studies were reviewed today:   EKG: June 15, 2021: Normal sinus rhythm.  No ST or T wave changes.  Recent Labs: 06/16/2021: Magnesium 1.6 06/23/2021: Pro B Natriuretic peptide (BNP) 174.0 07/13/2021: ALT 18; BUN 10; Creatinine 0.92; Hemoglobin 13.5; Platelet Count 215; Potassium 4.0; Sodium 141; TSH 2.722  Recent Lipid Panel    Component Value Date/Time   CHOL 174 08/25/2017 0836   TRIG 130 08/25/2017 0836   HDL 56 08/25/2017 0836   CHOLHDL 3.1 08/25/2017 0836   LDLCALC 92 08/25/2017 0836     Risk Assessment/Calculations:          Physical Exam:    VS:  BP 122/62   Pulse 73   Ht 6' (1.829 m)   Wt 172 lb 6.4 oz (78.2 kg)   SpO2 96%   BMI 23.38 kg/m     Wt Readings from Last 3 Encounters:  07/24/21 172 lb 6.4 oz (78.2 kg)  07/17/21 171 lb 12.8 oz (77.9 kg)  07/13/21 169 lb 12 oz (77 kg)     GEN:  Well nourished, well developed in no acute distress HEENT: Normal NECK: No JVD; No carotid bruits LYMPHATICS: No lymphadenopathy CARDIAC: RRR, no murmurs, rubs, gallops RESPIRATORY:  Clear to auscultation without rales, wheezing or rhonchi  ABDOMEN: Soft, non-tender, non-distended MUSCULOSKELETAL:  No edema; No deformity  SKIN: Warm and dry NEUROLOGIC:  Alert and oriented x 3 PSYCHIATRIC:  Normal affect   ASSESSMENT:    1. Dyspnea on exertion   2. Syncope, unspecified syncope type    PLAN:    In order of problems listed above:  Chest  pressure: The patient originally denied chest pressure but apparently the wife remembers at least one episode of chest pressure that occurred several weeks ago.  He is a long-term smoker.  There is a family history of cardiovascular disease. I would like to get a [employer] study for further evaluation.  2.  Orthostatic hypotension: The patient has been diagnosed with pituitary insufficiency 2 months ago.  He was originally treated with Cortef but this did not resolve  his orthostasis.  He then was started on Florinef but also this did not adequately treat his orthostasis.  He then had prednisone added to the Florinef and now he is mostly symptom-free.  He has not had any severe episodes of orthostatic hypotension while being on Florinef and prednisone. He has a history of metastatic melanoma and there is some concern that the prednisone might worsen the metastatic characteristics of his melanoma.  He had an echocardiogram which revealed normal left ventricular systolic function.  He does have diastolic dysfunction.  He has no significant valvular disease.  He was primarily referred to [place] for further evaluation of his orthostatic hypotension.   From a cardiac standpoint, he does not have any structural heart disease.  I think would be reasonable to rule out ischemic heart disease as a possible contributor to his orthostatic hypotension.  If the Myoview study is normal then I do not think that there is any role that the heart is playing in his episodes of orthostatic hypotension.  3.  Hyperthyroidism:  he was found to have a low TSH and was diagnosed with  hypothyroidism and is on methimazole.                Shared Decision Making/Informed Consent{  The risks [chest pain, shortness of breath, cardiac arrhythmias, dizziness, blood pressure fluctuations, myocardial infarction, stroke/transient ischemic attack, nausea, vomiting, allergic reaction, radiation exposure, metallic taste sensation  and life-threatening complications (estimated to be 1 in 10,000)], benefits (risk stratification, diagnosing coronary artery disease, treatment guidance) and alternatives of a nuclear stress test were discussed in detail with Mr. Marasigan and he agrees to proceed.    Medication Adjustments/Labs and Tests Ordered: Current medicines are reviewed at length with the patient today.  Concerns regarding medicines are outlined above.  Orders Placed This Encounter  Procedures   Cardiac Stress Test: Informed Consent Details: Physician/Practitioner Attestation; Transcribe to consent form and obtain patient signature   MYOCARDIAL PERFUSION IMAGING   No orders of the defined types were placed in this encounter.   Patient Instructions  Medication Instructions:  Your physician recommends that you continue on your current medications as directed. Please refer to the Current Medication list given to you today.  *If you need a refill on your cardiac medications before your next appointment, please call your pharmacy*   Lab Work: NONE If you have labs (blood work) drawn today and your tests are completely normal, you will receive your results only by: MyChart Message (if you have MyChart) OR A paper copy in the mail If you have any lab test that is abnormal or we need to change your treatment, we will call you to review the results.   Testing/Procedures: Your physician has requested that you have a lexiscan myoview. For further information please visit [email or web address] Please follow instruction sheet, as given.    Follow-Up: At [facility], you and your health needs are our priority.  As part of our continuing mission to provide you with exceptional heart care, we have created designated Provider Care Teams.  These Care Teams include your primary Cardiologist (physician) and Advanced Practice Providers (APPs -  Physician Assistants and Nurse Practitioners) who all work together to provide you  with the care you need, when you need it.  Your next appointment:   As needed  The format for your next appointment:   In Person  Provider:   Kristeen Miss, MD    Signed, Kristeen Miss, MD  07/24/2021 5:58 PM  [facility]

## 2021-07-27 ENCOUNTER — Other Ambulatory Visit: Payer: Self-pay

## 2021-07-27 DIAGNOSIS — R7989 Other specified abnormal findings of blood chemistry: Secondary | ICD-10-CM

## 2021-07-27 DIAGNOSIS — E230 Hypopituitarism: Secondary | ICD-10-CM

## 2021-07-27 DIAGNOSIS — C4359 Malignant melanoma of other part of trunk: Secondary | ICD-10-CM

## 2021-07-28 ENCOUNTER — Inpatient Hospital Stay (HOSPITAL_BASED_OUTPATIENT_CLINIC_OR_DEPARTMENT_OTHER): Payer: BC Managed Care – PPO | Admitting: Hematology

## 2021-07-28 ENCOUNTER — Telehealth (HOSPITAL_COMMUNITY): Payer: Self-pay

## 2021-07-28 ENCOUNTER — Other Ambulatory Visit: Payer: Self-pay

## 2021-07-28 ENCOUNTER — Inpatient Hospital Stay: Payer: BC Managed Care – PPO | Attending: Hematology

## 2021-07-28 ENCOUNTER — Inpatient Hospital Stay: Payer: BC Managed Care – PPO

## 2021-07-28 VITALS — BP 110/73 | HR 80 | Temp 97.9°F | Resp 18 | Ht 72.0 in | Wt 172.5 lb

## 2021-07-28 DIAGNOSIS — C4359 Malignant melanoma of other part of trunk: Secondary | ICD-10-CM

## 2021-07-28 DIAGNOSIS — R7989 Other specified abnormal findings of blood chemistry: Secondary | ICD-10-CM

## 2021-07-28 DIAGNOSIS — Z79899 Other long term (current) drug therapy: Secondary | ICD-10-CM | POA: Insufficient documentation

## 2021-07-28 DIAGNOSIS — C799 Secondary malignant neoplasm of unspecified site: Secondary | ICD-10-CM | POA: Diagnosis not present

## 2021-07-28 DIAGNOSIS — E230 Hypopituitarism: Secondary | ICD-10-CM

## 2021-07-28 DIAGNOSIS — Z5112 Encounter for antineoplastic immunotherapy: Secondary | ICD-10-CM | POA: Insufficient documentation

## 2021-07-28 DIAGNOSIS — C4360 Malignant melanoma of unspecified upper limb, including shoulder: Secondary | ICD-10-CM | POA: Insufficient documentation

## 2021-07-28 LAB — CMP (CANCER CENTER ONLY)
ALT: 24 U/L (ref 0–44)
AST: 25 U/L (ref 15–41)
Albumin: 4.2 g/dL (ref 3.5–5.0)
Alkaline Phosphatase: 134 U/L — ABNORMAL HIGH (ref 38–126)
Anion gap: 9 (ref 5–15)
BUN: 15 mg/dL (ref 8–23)
CO2: 21 mmol/L — ABNORMAL LOW (ref 22–32)
Calcium: 9.2 mg/dL (ref 8.9–10.3)
Chloride: 105 mmol/L (ref 98–111)
Creatinine: 0.92 mg/dL (ref 0.61–1.24)
GFR, Estimated: >60 mL/min (ref >60)
Glucose, Bld: 140 mg/dL — ABNORMAL HIGH (ref 70–99)
Potassium: 4.2 mmol/L (ref 3.5–5.1)
Sodium: 135 mmol/L (ref 135–145)
Total Bilirubin: 0.9 mg/dL (ref 0.3–1.2)
Total Protein: 7.3 g/dL (ref 6.5–8.1)

## 2021-07-28 LAB — CBC WITH DIFFERENTIAL (CANCER CENTER ONLY)
Abs Immature Granulocytes: 0.01 10*3/uL (ref 0.00–0.07)
Basophils Absolute: 0.1 10*3/uL (ref 0.0–0.1)
Basophils Relative: 1 %
Eosinophils Absolute: 0.5 10*3/uL (ref 0.0–0.5)
Eosinophils Relative: 5 %
HCT: 44.2 % (ref 39.0–52.0)
Hemoglobin: 14.7 g/dL (ref 13.0–17.0)
Immature Granulocytes: 0 %
Lymphocytes Relative: 32 %
Lymphs Abs: 2.7 10*3/uL (ref 0.7–4.0)
MCH: 31.1 pg (ref 26.0–34.0)
MCHC: 33.3 g/dL (ref 30.0–36.0)
MCV: 93.4 fL (ref 80.0–100.0)
Monocytes Absolute: 0.6 10*3/uL (ref 0.1–1.0)
Monocytes Relative: 7 %
Neutro Abs: 4.7 10*3/uL (ref 1.7–7.7)
Neutrophils Relative %: 55 %
Platelet Count: 248 10*3/uL (ref 150–400)
RBC: 4.73 MIL/uL (ref 4.22–5.81)
RDW: 13.9 % (ref 11.5–15.5)
WBC Count: 8.6 10*3/uL (ref 4.0–10.5)
nRBC: 0 % (ref 0.0–0.2)

## 2021-07-28 LAB — TSH: TSH: 11.244 u[IU]/mL — ABNORMAL HIGH (ref 0.320–4.118)

## 2021-07-28 MED ORDER — SODIUM CHLORIDE 0.9 % IV SOLN
240.0000 mg | Freq: Once | INTRAVENOUS | Status: AC
  Administered 2021-07-28: 240 mg via INTRAVENOUS
  Filled 2021-07-28: qty 24

## 2021-07-28 MED ORDER — SODIUM CHLORIDE 0.9 % IV SOLN: Freq: Once | INTRAVENOUS | Status: AC

## 2021-07-28 NOTE — Telephone Encounter (Signed)
Spoke with the patient's wife. She stated that he would be here for his test. Asked to call back with any questions. S.Chasta Deshpande EMTP ?

## 2021-07-28 NOTE — Progress Notes (Signed)
? ? ?HEMATOLOGY/ONCOLOGY CLINIC NOTE ? ?Date of Service: 07/28/2021 ? ?Patient Care Team: ?[name], MD as PCP - General (Internal Medicine) ?Nahser, Wonda Cheng, MD as PCP - Cardiology (Cardiology) ? ?CHIEF COMPLAINTS/PURPOSE OF CONSULTATION:  ?Follow-up for continued evaluation and management of metastatic melanoma ? ?HISTORY OF PRESENTING ILLNESS:  ?Previous notes for details of initial presentation ? ?INTERVAL HISTORY ? ?[name] is a here for continued evaluation and management of his metastatic melanoma. He presents today accompanied by his wife. ? ?He reports feeling better over the course of the last month. He reports weight gain. He has been increasing his sodium intake per prior recommendation. He stopped taking fludrocortisone because he says he gets bad headaches and pressure. He endorses prednisone saying it alleviates some of his symptoms.  ? ?He reports bilateral numbness in his feet while walking around. He says that they feel very cold. He denies any issues with feet changing color. He endorses using hot water on his feet and says it improves these symptoms.  ? ?He denies any diarrhea, skin rashes or any other new or acute symptoms. ? ?Labs from today reviewed in detail with the patient. ? ?MEDICAL HISTORY:  ?Past Medical History:  ?Diagnosis Date  ? Adrenal insufficiency ([place])   ? Cancer [facility])   ? COPD (chronic obstructive pulmonary disease) ([place])   ? Depression   ? Dyspnea   ? with 31f of ambulation. 200 feet if he walks slow.  ? Fatty liver   ? History of kidney stones   ? seen on CT Scan  ? ? ?SURGICAL HISTORY: ?Past Surgical History:  ?Procedure Laterality Date  ? MASS EXCISION Right 12/15/2020  ? Procedure: EXCISION OF MALIGNANT MASS RIGHT POSTERIOR SHOULDER;  Surgeon: BStark Klein MD;  [facility] [facility]  Service: General;  Laterality: Right;  ? WISDOM TOOTH EXTRACTION    ? ? ?SOCIAL HISTORY: ?Social History  ? ?Socioeconomic History  ? Marital status: Married  ?  Spouse name:  Not on file  ? Number of children: 2  ? Years of education: Not on file  ? Highest education level: Not on file  ?Occupational History  ? Occupation: PPublic librarian ?  Comment: [employer]  ?Tobacco Use  ? Smoking status: Every Day  ?  Packs/day: 0.50  ?  Years: 40.00  ?  Pack years: 20.00  ?  Types: Cigarettes  ?  Start date: 05/17/1981  ? Smokeless tobacco: Never  ?Vaping Use  ? Vaping Use: Never used  ?Substance and Sexual Activity  ? Alcohol use: Yes  ?  Alcohol/week: 6.0 standard drinks  ?  Types: 4 Cans of beer, 2 Standard drinks or equivalent per week  ? Drug use: No  ? Sexual activity: Yes  ?  Partners: Female  ?  Birth control/protection: None  ?Other Topics Concern  ? Not on file  ?Social History Narrative  ? Married  ? 2 children--- 1 daughter/1 son  ? 6 step children  ? ?Social Determinants of Health  ? ?Financial Resource Strain: Not on file  ?Food Insecurity: Not on file  ?Transportation Needs: Not on file  ?Physical Activity: Not on file  ?Stress: Not on file  ?Social Connections: Not on file  ?Intimate Partner Violence: Not on file  ? ? ?FAMILY HISTORY: ?Family History  ?Problem Relation Age of Onset  ? Stroke Mother   ? Dementia Mother   ? Breast cancer Sister   ? Thyroid cancer Sister   ?  Stroke Maternal Grandmother   ? Breast cancer Paternal Grandmother   ? Thyroid disease Neg Hx   ? ? ?ALLERGIES:  is allergic to duloxetine. ? ?MEDICATIONS:  ?Current Outpatient Medications  ?Medication Sig Dispense Refill  ? fludrocortisone (FLORINEF) 0.1 MG tablet Take 1 tablet (0.1 mg total) by mouth daily. 90 tablet 0  ? Fluticasone-Umeclidin-Vilant (TRELEGY ELLIPTA) 100-62.5-25 MCG/INH AEPB Inhale 1 puff into the lungs daily. 60 each 6  ? methimazole (TAPAZOLE) 10 MG tablet Take 1 tablet (10 mg total) by mouth 2 (two) times daily. 180 tablet 1  ? polyvinyl alcohol (LIQUIFILM TEARS) 1.4 % ophthalmic solution Place 1 drop into both eyes as needed for dry eyes.    ? predniSONE (DELTASONE) 5 MG  tablet Take 1 tablet (5 mg total) by mouth daily with breakfast. 90 tablet 3  ? prochlorperazine (COMPAZINE) 10 MG tablet Take 1 tablet (10 mg total) by mouth every 6 (six) hours as needed for nausea or vomiting. 30 tablet 0  ? ?No current facility-administered medications for this visit.  ? ? ?REVIEW OF SYSTEMS:   ?10 Point review of Systems was done is negative except as noted above. ? ?PHYSICAL EXAMINATION: ?ECOG PERFORMANCE STATUS: 1 - Symptomatic but completely ambulatory ? ?.BP 110/73 (BP Location: Left Arm, Patient Position: Sitting)   Pulse 80   Temp 97.9 ?F (36.6 ?C) (Temporal)   Resp 18   Ht 6' (1.829 m)   Wt 172 lb 8 oz (78.2 kg)   SpO2 97%   BMI 23.40 kg/m?  ?NAD ? ? ?GENERAL:alert, in no acute distress and comfortable ?SKIN: no acute rashes, no significant lesions ?EYES: conjunctiva are pink and non-injected, sclera anicteric ?NECK: supple, no JVD ?LYMPH:  no palpable lymphadenopathy in the cervical, axillary or inguinal regions ?LUNGS: clear to auscultation b/l with normal respiratory effort ?HEART: regular rate & rhythm ?ABDOMEN:  normoactive bowel sounds , non tender, not distended. ?Extremity: no pedal edema ?PSYCH: alert & oriented x 3 with fluent speech ?NEURO: no focal motor/sensory deficits ? ? ? ?LABORATORY DATA:  ?I have reviewed the data as listed ? ?. ?CBC Latest Ref Rng & Units 07/28/2021 07/13/2021 06/29/2021  ?WBC 4.0 - 10.5 K/uL 8.6 9.7 9.8  ?Hemoglobin 13.0 - 17.0 g/dL 14.7 13.5 13.6  ?Hematocrit 39.0 - 52.0 % 44.2 40.9 40.2  ?Platelets 150 - 400 K/uL 248 215 363  ? ?. ?CMP Latest Ref Rng & Units 07/13/2021 06/29/2021 06/23/2021  ?Glucose 70 - 99 mg/dL 107(H) 113(H) 109(H)  ?BUN 8 - 23 mg/dL 10 12 14   ?Creatinine 0.61 - 1.24 mg/dL 0.92 0.99 1.30  ?Sodium 135 - 145 mmol/L 141 139 135  ?Potassium 3.5 - 5.1 mmol/L 4.0 3.7 4.6  ?Chloride 98 - 111 mmol/L 110 106 102  ?CO2 22 - 32 mmol/L 24 26 29   ?Calcium 8.9 - 10.3 mg/dL 9.3 8.8(L) 9.3  ?Total Protein 6.5 - 8.1 g/dL 7.2 6.9 -  ?Total  Bilirubin 0.3 - 1.2 mg/dL 0.4 0.4 -  ?Alkaline Phos 38 - 126 U/L 98 108 -  ?AST 15 - 41 U/L 17 28 -  ?ALT 0 - 44 U/L 18 33 -  ? ?TSH 11.24 ? ?SURGICAL PATHOLOGY  ?CASE: [id number]  ?PATIENT: George West  ?Surgical Pathology Report  ? ?Clinical History: right posterior shoulder soft tissue mass (cm)  ? ?FINAL MICROSCOPIC DIAGNOSIS:  ? ?A. SOFT TISSUE MASS, RIGHT POSTERIOR SHOULDER, NEEDLE CORE BIOPSY:  ?-  Malignant melanoma  ?-  See comment  ? ?COMMENT:  ? ?  By immunohistochemistry, the neoplastic cells are positive for S100,  ?HMB45, Sox 10 and Melan-A (patchy) but negative for CD45, CD138,  ?cytokeratin 7, cytokeratin 20, cytokeratin AE1/3, desmin and EMA.  The  ?morphology and immunophenotype are consistent with malignant melanoma.  ?Dr. Irene Limbo was notified of these results on November 24, 2020.  Dr. Saralyn Pilar  ?reviewed the case and agrees with the above diagnosis.  ? ? ?RADIOGRAPHIC STUDIES: ?I have personally reviewed the radiological images as listed and agreed with the findings in the report. ?No results found. ? ? ?ASSESSMENT & PLAN:  ? ?61 yo with  ? ?1) IgM Kappa monoclonal paraproteinemia  ?? Waldenstroms macroglobulinemia ? ?2) Recently diagnosed metastatic malignant melanoma.  Unclear primary site with the patient presented with a metastatic deposit in the soft tissue/skin over the right posterior shoulder. ?Patient also has multiple slightly hypermetabolic lymph nodes-right axillary, subpectoral and right cervical.  These could be from metastatic melanoma or a possible indolent lymphoma related to his IgM kappa monoclonal paraproteinemia. ?-Molecular testing was sent on the pathology sample and is BRAF positive ?-PD-L1 testing positive for PD-L1 expression at 1% ? ?3) grade 1 diarrhea likely from ipilimumab plus nivolumab.  Resolved of Ipilimumab. 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 ? PLAN ?-Labs today stable with normal CBC and CMP ?-Patient's blood pressure is better with no significant orthostatic dizziness  ?-Advised to reduce his fludrocortisone to 0.1 mcg p.o

## 2021-07-28 NOTE — Progress Notes (Signed)
Per Dr. Irene Limbo, ok for treatment today with elevated TSH. ?

## 2021-07-28 NOTE — Patient Instructions (Signed)
[facility] MEDICAL ONCOLOGY  Discharge Instructions: °Thank you for choosing [facility] to provide your oncology and hematology care.  ° °If you have a lab appointment with the Cancer Center, please go directly to the Cancer Center and check in at the registration area. °  °Wear comfortable clothing and clothing appropriate for easy access to any Portacath or PICC line.  ° °We strive to give you quality time with your provider. You may need to reschedule your appointment if you arrive late (15 or more minutes).  Arriving late affects you and other patients whose appointments are after yours.  Also, if you miss three or more appointments without notifying the office, you may be dismissed from the clinic at the provider’s discretion.    °  °For prescription refill requests, have your pharmacy contact our office and allow 72 hours for refills to be completed.   ° °Today you received the following chemotherapy and/or immunotherapy agent: Nivolumab (Opdivo) °  °To help prevent nausea and vomiting after your treatment, we encourage you to take your nausea medication as directed. ° °BELOW ARE SYMPTOMS THAT SHOULD BE REPORTED IMMEDIATELY: °*FEVER GREATER THAN 100.4 F (38 °C) OR HIGHER °*CHILLS OR SWEATING °*NAUSEA AND VOMITING THAT IS NOT CONTROLLED WITH YOUR NAUSEA MEDICATION °*UNUSUAL SHORTNESS OF BREATH °*UNUSUAL BRUISING OR BLEEDING °*URINARY PROBLEMS (pain or burning when urinating, or frequent urination) °*BOWEL PROBLEMS (unusual diarrhea, constipation, pain near the anus) °TENDERNESS IN MOUTH AND THROAT WITH OR WITHOUT PRESENCE OF ULCERS (sore throat, sores in mouth, or a toothache) °UNUSUAL RASH, SWELLING OR PAIN  °UNUSUAL VAGINAL DISCHARGE OR ITCHING  ° °Items with * indicate a potential emergency and should be followed up as soon as possible or go to the Emergency Department if any problems should occur. ° °Please show the CHEMOTHERAPY ALERT CARD or IMMUNOTHERAPY ALERT CARD at  check-in to the Emergency Department and triage nurse. ° °Should you have questions after your visit or need to cancel or reschedule your appointment, please contact [facility] MEDICAL ONCOLOG[facility]  Dept: [phone]  and follow the prompts.  Office hours are 8:00 a.m. to 4:30 p.m. Monday - Friday. Please note that voicemails left after 4:00 p.m. may not be returned until the following business day.  We are closed weekends and major holidays. You have access to a nurse at all times for urgent questions. Please call the main number to the clinic Dept: [phone] and follow the prompts. ° ° °For any non-urgent questions, you may also contact your provider using MyChart. We now offer e-Visits for anyone 18 and older to request care online for non-urgent symptoms. For details visit [email or web address]. °  °Also download the MyChart app! Go to the app store, search "MyChart", open the app, select [facility], and log in with your MyChart username and password. ° °Due to Covid, a mask is required upon entering the hospital/clinic. If you do not have a mask, one will be given to you upon arrival. For doctor visits, patients may have 1 support person aged 18 or older with them. For treatment visits, patients cannot have anyone with them due to current Covid guidelines and our immunocompromised population.  ° °

## 2021-07-29 ENCOUNTER — Telehealth: Payer: Self-pay | Admitting: Hematology

## 2021-07-29 NOTE — Telephone Encounter (Signed)
Scheduled follow-up appointments per 3/14 los. Patient's wife is aware. ?

## 2021-07-30 ENCOUNTER — Ambulatory Visit (HOSPITAL_COMMUNITY): Payer: BC Managed Care – PPO | Attending: Internal Medicine

## 2021-07-30 ENCOUNTER — Ambulatory Visit (INDEPENDENT_AMBULATORY_CARE_PROVIDER_SITE_OTHER): Payer: BC Managed Care – PPO | Admitting: Endocrinology

## 2021-07-30 ENCOUNTER — Other Ambulatory Visit: Payer: Self-pay

## 2021-07-30 VITALS — BP 138/88 | HR 78 | Ht 72.0 in | Wt 173.8 lb

## 2021-07-30 DIAGNOSIS — R55 Syncope and collapse: Secondary | ICD-10-CM | POA: Diagnosis not present

## 2021-07-30 DIAGNOSIS — E0590 Thyrotoxicosis, unspecified without thyrotoxic crisis or storm: Secondary | ICD-10-CM | POA: Diagnosis not present

## 2021-07-30 DIAGNOSIS — R0609 Other forms of dyspnea: Secondary | ICD-10-CM | POA: Insufficient documentation

## 2021-07-30 LAB — MYOCARDIAL PERFUSION IMAGING
LV dias vol: 77 mL (ref 62–150)
LV sys vol: 27 mL
Nuc Stress EF: 65 %
Peak HR: 91 {beats}/min
Rest HR: 63 {beats}/min
Rest Nuclear Isotope Dose: 10.8 mCi
SDS: 1
SRS: 0
SSS: 1
ST Depression (mm): 0 mm
Stress Nuclear Isotope Dose: 32 mCi
TID: 1.1

## 2021-07-30 MED ORDER — REGADENOSON 0.4 MG/5ML IV SOLN
0.4000 mg | Freq: Once | INTRAVENOUS | Status: AC
  Administered 2021-07-30: 0.4 mg via INTRAVENOUS

## 2021-07-30 MED ORDER — METHIMAZOLE 10 MG PO TABS: 10.0000 mg | ORAL_TABLET | Freq: Every day | ORAL | 1 refills | Status: DC

## 2021-07-30 MED ORDER — TECHNETIUM TC 99M TETROFOSMIN IV KIT
32.0000 | PACK | Freq: Once | INTRAVENOUS | Status: AC | PRN
  Administered 2021-07-30: 32 via INTRAVENOUS
  Filled 2021-07-30: qty 32

## 2021-07-30 MED ORDER — TECHNETIUM TC 99M TETROFOSMIN IV KIT
1003.0000 | PACK | Freq: Once | INTRAVENOUS | Status: AC | PRN
  Administered 2021-07-30: 10.3 via INTRAVENOUS
  Filled 2021-07-30: qty 1003

## 2021-07-30 NOTE — Progress Notes (Signed)
? ?Subjective:  ? ? Patient ID: George West, male    DOB: 1960-09-07, 61 y.o.   MRN: [id number] ? 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   ?He has these pit functions: ?ACTH: on prednisone.   ?Prolactin: borderline high.   ?FSH/LH: low.   ?TSH: primary hyperthyroidism.   ?VP: USG and urine Na+ are normal.   ?pt states he feels better in general.  In particular, lightheadedness is much better.  Wife checks BP at home.  It varies from 103-160/65-80.   ?Past Medical History:  ?Diagnosis Date  ? Adrenal insufficiency ([place])   ? Cancer [facility])   ? COPD (chronic obstructive pulmonary disease) ([place])   ? Depression   ? Dyspnea   ? with 17f of ambulation. 200 feet if he walks slow.  ? Fatty liver   ? History of kidney stones   ? seen on CT Scan  ? ? ?Past Surgical History:  ?Procedure Laterality Date  ? MASS EXCISION Right 12/15/2020  ? 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 WISDOM TOOTH EXTRACTION    ? ? ?Social History  ? ?Socioeconomic History  ? Marital status: Married  ?  Spouse name: Not on file  ? Number of children: 2  ? Years of education: Not on file  ? Highest education level: Not on file  ?Occupational History  ? Occupation: PPublic librarian ?  Comment: [employer]  ?Tobacco Use  ? Smoking status: Every Day  ?  Packs/day: 0.50  ?  Years: 40.00  ?  Pack years: 20.00  ?  Types: Cigarettes  ?  Start date: 05/17/1981  ? Smokeless tobacco: Never  ?Vaping Use  ? Vaping Use: Never used  ?Substance and Sexual Activity  ? Alcohol use: Yes  ?  Alcohol/week: 6.0 standard drinks  ?  Types: 4 Cans of beer, 2 Standard drinks or equivalent per week  ? Drug use: No  ? Sexual activity: Yes  ?  Partners: Female  ?  Birth  control/protection: None  ?Other Topics Concern  ? Not on file  ?Social History Narrative  ? Married  ? 2 children--- 1 daughter/1 son  ? 6 step children  ? ?Social Determinants of Health  ? ?Financial Resource Strain: Not on file  ?Food Insecurity: Not on file  ?Transportation Needs: Not on file  ?Physical Activity: Not on file  ?Stress: Not on file  ?Social Connections: Not on file  ?Intimate Partner Violence: Not on file  ? ? ?Current Outpatient Medications on File Prior to Visit  ?Medication Sig Dispense Refill  ? Fluticasone-Umeclidin-Vilant (TRELEGY ELLIPTA) 100-62.5-25 MCG/INH AEPB Inhale 1 puff into the lungs daily. 60 each 6  ? polyvinyl alcohol (LIQUIFILM TEARS) 1.4 % ophthalmic solution Place 1 drop into both eyes as needed for dry eyes.    ? predniSONE (DELTASONE) 5 MG tablet Take 1 tablet (5 mg total) by mouth daily with breakfast. 90 tablet 3  ? prochlorperazine (COMPAZINE) 10 MG tablet Take 1 tablet (10 mg total) by mouth every 6 (six) hours as needed for nausea or vomiting. 30 tablet 0  ? ?No current facility-administered medications on file prior to visit.  ? ? ?Allergies  ?Allergen Reactions  ? Duloxetine   ?  dizziness  ? ? ?Family History  ?Problem Relation Age of Onset  ? Stroke Mother   ? Dementia Mother   ? Breast cancer Sister   ? Thyroid cancer Sister   ? Stroke Maternal Grandmother   ? Breast cancer Paternal Grandmother   ? Thyroid disease Neg Hx   ? ? ?BP 138/88 (BP Location: Left Arm, Patient Position: Sitting, Cuff Size: Normal)   Pulse 78   Ht 6' (1.829 m)   Wt 173 lb 12.8 oz (78.8 kg)   SpO2 95%   BMI 23.57 kg/m?  ? ? ?Review of Systems ?Denies fever.   ?   ?Objective:  ? Physical Exam ?VITAL SIGNS:  See vs page ?GENERAL: no distress ?NECK: thyroid is slightly and diffusely enlarged.  ? ? ?Lab Results  ?Component Value Date  ? TSH 11.244 (H) 07/28/2021  ? ?   ?Assessment & Plan:  ?Hyperthyroidism: overcontrolled.   ?Pit insuff: on rx. ?Hypotension: uncertain etiology and  prognosis. ? ?Patient Instructions  ?Please stop taking the fludrocortisone and methimazole.  ?In 1 week, please resume the fludrocortisone at 1 pill per day.   ?Please come back for a follow-up appointment in 1 month.   ? ? ?

## 2021-07-30 NOTE — Patient Instructions (Addendum)
Please stop taking the fludrocortisone and methimazole.  ?In 1 week, please resume the fludrocortisone at 1 pill per day.   ?Please come back for a follow-up appointment in 1 month.   ?

## 2021-08-03 ENCOUNTER — Encounter: Payer: Self-pay | Admitting: Hematology

## 2021-08-10 ENCOUNTER — Inpatient Hospital Stay: Payer: BC Managed Care – PPO

## 2021-08-12 ENCOUNTER — Other Ambulatory Visit: Payer: Self-pay | Admitting: *Deleted

## 2021-08-12 DIAGNOSIS — C4359 Malignant melanoma of other part of trunk: Secondary | ICD-10-CM

## 2021-08-14 ENCOUNTER — Inpatient Hospital Stay: Payer: BC Managed Care – PPO

## 2021-08-14 ENCOUNTER — Other Ambulatory Visit: Payer: Self-pay

## 2021-08-14 VITALS — BP 112/75 | HR 65 | Temp 98.4°F | Resp 18 | Wt 174.5 lb

## 2021-08-14 DIAGNOSIS — Z5112 Encounter for antineoplastic immunotherapy: Secondary | ICD-10-CM | POA: Diagnosis not present

## 2021-08-14 DIAGNOSIS — C4360 Malignant melanoma of unspecified upper limb, including shoulder: Secondary | ICD-10-CM | POA: Diagnosis not present

## 2021-08-14 DIAGNOSIS — C4359 Malignant melanoma of other part of trunk: Secondary | ICD-10-CM

## 2021-08-14 DIAGNOSIS — C799 Secondary malignant neoplasm of unspecified site: Secondary | ICD-10-CM | POA: Diagnosis not present

## 2021-08-14 DIAGNOSIS — Z79899 Other long term (current) drug therapy: Secondary | ICD-10-CM | POA: Diagnosis not present

## 2021-08-14 LAB — CMP (CANCER CENTER ONLY)
ALT: 19 U/L (ref 0–44)
AST: 19 U/L (ref 15–41)
Albumin: 4.2 g/dL (ref 3.5–5.0)
Alkaline Phosphatase: 115 U/L (ref 38–126)
Anion gap: 9 (ref 5–15)
BUN: 10 mg/dL (ref 8–23)
CO2: 23 mmol/L (ref 22–32)
Calcium: 9.1 mg/dL (ref 8.9–10.3)
Chloride: 107 mmol/L (ref 98–111)
Creatinine: 0.93 mg/dL (ref 0.61–1.24)
GFR, Estimated: >60 mL/min (ref >60)
Glucose, Bld: 126 mg/dL — ABNORMAL HIGH (ref 70–99)
Potassium: 4.2 mmol/L (ref 3.5–5.1)
Sodium: 139 mmol/L (ref 135–145)
Total Bilirubin: 0.7 mg/dL (ref 0.3–1.2)
Total Protein: 7.6 g/dL (ref 6.5–8.1)

## 2021-08-14 LAB — CBC WITH DIFFERENTIAL (CANCER CENTER ONLY)
Abs Immature Granulocytes: 0.02 10*3/uL (ref 0.00–0.07)
Basophils Absolute: 0.1 10*3/uL (ref 0.0–0.1)
Basophils Relative: 1 %
Eosinophils Absolute: 0.3 10*3/uL (ref 0.0–0.5)
Eosinophils Relative: 3 %
HCT: 43.1 % (ref 39.0–52.0)
Hemoglobin: 14.7 g/dL (ref 13.0–17.0)
Immature Granulocytes: 0 %
Lymphocytes Relative: 36 %
Lymphs Abs: 3 10*3/uL (ref 0.7–4.0)
MCH: 31.7 pg (ref 26.0–34.0)
MCHC: 34.1 g/dL (ref 30.0–36.0)
MCV: 93.1 fL (ref 80.0–100.0)
Monocytes Absolute: 0.6 10*3/uL (ref 0.1–1.0)
Monocytes Relative: 7 %
Neutro Abs: 4.3 10*3/uL (ref 1.7–7.7)
Neutrophils Relative %: 53 %
Platelet Count: 222 10*3/uL (ref 150–400)
RBC: 4.63 MIL/uL (ref 4.22–5.81)
RDW: 14.5 % (ref 11.5–15.5)
WBC Count: 8.2 10*3/uL (ref 4.0–10.5)
nRBC: 0 % (ref 0.0–0.2)

## 2021-08-14 LAB — TSH: TSH: 3.413 u[IU]/mL (ref 0.320–4.118)

## 2021-08-14 MED ORDER — SODIUM CHLORIDE 0.9 % IV SOLN: Freq: Once | INTRAVENOUS | Status: DC

## 2021-08-14 MED ORDER — SODIUM CHLORIDE 0.9 % IV SOLN: Freq: Once | INTRAVENOUS | Status: AC

## 2021-08-14 MED ORDER — SODIUM CHLORIDE 0.9 % IV SOLN
240.0000 mg | Freq: Once | INTRAVENOUS | Status: AC
  Administered 2021-08-14: 240 mg via INTRAVENOUS
  Filled 2021-08-14: qty 24

## 2021-08-14 NOTE — Patient Instructions (Signed)
[facility]COL[facility]  Discharge Instructions: °Thank you for choosing [facility] to provide your oncology and hematology care.  ° °If you have a lab appointment with the Cancer Center, please go directly to the Cancer Center and check in at the registration area. °  °Wear comfortable clothing and clothing appropriate for easy access to any Portacath or PICC line.  ° °We strive to give you quality time with your provider. You may need to reschedule your appointment if you arrive late (15 or more minutes).  Arriving late affects you and other patients whose appointments are after yours.  Also, if you miss three or more appointments without notifying the office, you may be dismissed from the clinic at the provider’s discretion.    °  °For prescription refill requests, have your pharmacy contact our office and allow 72 hours for refills to be completed.   ° °Today you received the following chemotherapy and/or immunotherapy agent: Nivolumab (Opdivo) °  °To help prevent nausea and vomiting after your treatment, we encourage you to take your nausea medication as directed. ° °BELOW ARE SYMPTOMS THAT SHOULD BE REPORTED IMMEDIATELY: °*FEVER GREATER THAN 100.4 F (38 °C) OR HIGHER °*CHILLS OR SWEATING °*NAUSEA AND VOMITING THAT IS NOT CONTROLLED WITH YOUR NAUSEA MEDICATION °*UNUSUAL SHORTNESS OF BREATH °*UNUSUAL BRUISING OR BLEEDING °*URINARY PROBLEMS (pain or burning when urinating, or frequent urination) °*BOWEL PROBLEMS (unusual diarrhea, constipation, pain near the anus) °TENDERNESS IN MOUTH AND THROAT WITH OR WITHOUT PRESENCE OF ULCERS (sore throat, sores in mouth, or a toothache) °UNUSUAL RASH, SWELLING OR PAIN  °UNUSUAL VAGINAL DISCHARGE OR ITCHING  ° °Items with * indicate a potential emergency and should be followed up as soon as possible or go to the Emergency Department if any problems should occur. ° °Please show the CHEMOTHERAPY ALERT CARD or IMMUNOTHERAPY ALERT CARD at  check-in to the Emergency Department and triage nurse. ° °Should you have questions after your visit or need to cancel or reschedule your appointment, please contact [facility]  Dept: [phone]  and follow the prompts.  Office hours are 8:00 a.m. to 4:30 p.m. Monday - Friday. Please note that voicemails left after 4:00 p.m. may not be returned until the following business day.  We are closed weekends and major holidays. You have access to a nurse at all times for urgent questions. Please call the main number to the clinic Dept: [phone] and follow the prompts. ° ° °For any non-urgent questions, you may also contact your provider using MyChart. We now offer e-Visits for anyone 18 and older to request care online for non-urgent symptoms. For details visit [email or web address]. °  °Also download the MyChart app! Go to the app store, search "MyChart", open the app, select [place], and log in with your MyChart username and password. ° °Due to Covid, a mask is required upon entering the hospital/clinic. If you do not have a mask, one will be given to you upon arrival. For doctor visits, patients may have 1 support [place] aged 18 or older with them. For treatment visits, patients cannot have anyone with them due to current Covid guidelines and our immunocompromised population.  ° °

## 2021-08-18 ENCOUNTER — Ambulatory Visit: Payer: BC Managed Care – PPO | Admitting: Endocrinology

## 2021-08-31 ENCOUNTER — Ambulatory Visit (INDEPENDENT_AMBULATORY_CARE_PROVIDER_SITE_OTHER): Payer: BC Managed Care – PPO | Admitting: Nurse Practitioner

## 2021-08-31 ENCOUNTER — Encounter: Payer: Self-pay | Admitting: Nurse Practitioner

## 2021-08-31 VITALS — BP 100/65 | HR 75 | Temp 98.2°F | Ht 72.05 in | Wt 172.7 lb

## 2021-08-31 DIAGNOSIS — Z7689 Persons encountering health services in other specified circumstances: Secondary | ICD-10-CM

## 2021-08-31 DIAGNOSIS — E230 Hypopituitarism: Secondary | ICD-10-CM | POA: Diagnosis not present

## 2021-08-31 DIAGNOSIS — J449 Chronic obstructive pulmonary disease, unspecified: Secondary | ICD-10-CM

## 2021-08-31 DIAGNOSIS — E0590 Thyrotoxicosis, unspecified without thyrotoxic crisis or storm: Secondary | ICD-10-CM | POA: Diagnosis not present

## 2021-08-31 DIAGNOSIS — R233 Spontaneous ecchymoses: Secondary | ICD-10-CM | POA: Diagnosis not present

## 2021-08-31 MED ORDER — TRIAMCINOLONE ACETONIDE 0.025 % EX CREA: 1.0000 "application " | TOPICAL_CREAM | Freq: Two times a day (BID) | CUTANEOUS | 2 refills | Status: DC

## 2021-08-31 NOTE — Progress Notes (Signed)
? ?New Patient Office Visit ? ?Subjective:  ?Patient ID: George West, male    DOB: [birth year]  Age: 61 y.o. MRN: [id number] ? ?CC:  ?Chief Complaint  ?Patient presents with  ? New Patient (Initial Visit)  ? ? ?HPI ?George West presents to establish new primary care provider.  ?-sees endocrinologist due to pituitary insufficiency and hyperthyroid. Was most recently on tapazole and prednisone. Was told to hold tapazole as thyroid was becoming very underactive. Has not had thyroid panel checked since changes were suggested to medications. Endocrinologist is getting ready to retire. He would like to see a new endocrinologist.  ?-gets immunotherapy for skin cancer. Does have blood work checked every two weeks. Oncologist is provider who caught the pituitary insufficiency.  ?-he does have history of COPD. Currently takes trelegy. He does have rescue inhaler. States that he rarely has to use it. Does not want to see pulmonology. This condition has been very stable.  ?-has noted a few "bumps" on his feet which he would like to have looked at.  ? ? ?Past Medical History:  ?Diagnosis Date  ? Adrenal insufficiency ([place])   ? Cancer [facility])   ? COPD (chronic obstructive pulmonary disease) ([place])   ? Depression   ? Dyspnea   ? with 52f of ambulation. 200 feet if he walks slow.  ? Fatty liver   ? History of kidney stones   ? seen on CT Scan  ? ? ?Past Surgical History:  ?Procedure Laterality Date  ? MASS EXCISION Right 12/15/2020  ? 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 WISDOM TOOTH EXTRACTION    ? ? ?Family History  ?Problem Relation Age of Onset  ? Stroke Mother   ? Dementia Mother   ? Breast cancer Sister   ? Thyroid cancer Sister   ? Stroke Maternal Grandmother   ? Breast cancer Paternal Grandmother   ? Thyroid disease Neg Hx   ? ? ?Social History  ? ?Socioeconomic History  ? Marital status: Married  ?  Spouse  name: Not on file  ? Number of children: 2  ? Years of education: Not on file  ? Highest education level: Not on file  ?Occupational History  ? Occupation: PPublic librarian ?  Comment: [employer]  ?Tobacco Use  ? Smoking status: Every Day  ?  Packs/day: 0.50  ?  Years: 40.00  ?  Pack years: 20.00  ?  Types: Cigarettes  ?  Start date: 05/17/1981  ? Smokeless tobacco: Never  ?Vaping Use  ? Vaping Use: Never used  ?Substance and Sexual Activity  ? Alcohol use: Yes  ?  Alcohol/week: 6.0 standard drinks  ?  Types: 4 Cans of beer, 2 Standard drinks or equivalent per week  ? Drug use: No  ? Sexual activity: Yes  ?  Partners: Female  ?  Birth control/protection: None  ?Other Topics Concern  ? Not on file  ?Social History Narrative  ? Married  ? 2 children--- 1 daughter/1 son  ? 6 step children  ? ?Social Determinants of Health  ? ?Financial Resource Strain: Not on file  ?Food Insecurity: Not on file  ?Transportation Needs: Not on file  ?Physical Activity: Not on file  ?Stress: Not on file  ?Social Connections: Not on file  ?Intimate Partner Violence: Not on file  ? ? ?ROS ?Review of Systems  ?Constitutional:  Negative for  activity change, chills, fatigue and fever.  ?HENT:  Negative for congestion, postnasal drip, rhinorrhea, sinus pressure, sinus pain, sneezing and sore throat.   ?Eyes: Negative.   ?Respiratory:  Negative for cough, shortness of breath and wheezing.   ?Cardiovascular:  Negative for chest pain and palpitations.  ?Gastrointestinal:  Negative for constipation, diarrhea, nausea and vomiting.  ?Endocrine: Negative for cold intolerance, heat intolerance, polydipsia and polyuria.  ?     Recent history of thyroid problems and pituitary insufficiency.   ?Genitourinary:  Negative for dysuria, frequency and urgency.  ?Musculoskeletal:  Negative for back pain and myalgias.  ?Skin:  Positive for rash.  ?     Has noted collection of red spots on the lower legs. Initially started off as small area of  itching, developed into more widespread, red, non inflamed lesions.   ?Allergic/Immunologic: Negative for environmental allergies.  ?Neurological:  Negative for dizziness, weakness and headaches.  ?Psychiatric/Behavioral:  The patient is not nervous/anxious.   ? ?Objective:  ? ?Today's Vitals  ? 08/31/21 0830  ?BP: 100/65  ?Pulse: 75  ?Temp: 98.2 ?F (36.8 ?C)  ?SpO2: 98%  ?Weight: 172 lb 11.2 oz (78.3 kg)  ?Height: 6' 0.05" (1.83 m)  ? ?Body mass index is 23.39 kg/m?.  ? ?Physical Exam ?Vitals and nursing note reviewed.  ?Constitutional:   ?   Appearance: Normal appearance. He is well-developed.  ?HENT:  ?   Head: Normocephalic and atraumatic.  ?   Nose: Nose normal.  ?   Mouth/Throat:  ?   Mouth: Mucous membranes are moist.  ?   Pharynx: Oropharynx is clear.  ?Eyes:  ?   Extraocular Movements: Extraocular movements intact.  ?   Conjunctiva/sclera: Conjunctivae normal.  ?   Pupils: Pupils are equal, round, and reactive to light.  ?Cardiovascular:  ?   Rate and Rhythm: Normal rate and regular rhythm.  ?   Pulses: Normal pulses.  ?   Heart sounds: Normal heart sounds.  ?Pulmonary:  ?   Effort: Pulmonary effort is normal.  ?   Breath sounds: Normal breath sounds.  ?Abdominal:  ?   Palpations: Abdomen is soft.  ?Musculoskeletal:     ?   General: Normal range of motion.  ?   Cervical back: Normal range of motion and neck supple.  ?Lymphadenopathy:  ?   Cervical: No cervical adenopathy.  ?Skin: ?   General: Skin is warm and dry.  ?   Capillary Refill: Capillary refill takes less than 2 seconds.  ?   Findings: Rash present. Rash is urticarial.  ?   Comments: There are fine, red, very slightly raised lesions spread over bilatral lower extremities. They are slightly itchy. No localized inflammation or evidence of infection present at this time .  ?Neurological:  ?   General: No focal deficit present.  ?   Mental Status: He is alert and oriented to person, place, and time.  ?Psychiatric:     ?   Mood and Affect: Mood normal.      ?   Behavior: Behavior normal.     ?   Thought Content: Thought content normal.     ?   Judgment: Judgment normal.  ? ? ?Assessment & Plan:  ?1. 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 New endocrinologist needed. Referral placed today.  ?- Ambulatory referral to Endocrinology ? ?2. Hyperthyroidism ?Check TSH and Free T4. Adjust medication dosing as indicated. A referral to new endocrinologist was made today.  ?- Ambulatory referral to  Endocrinology ?- T4, free; Future ?- TSH; Future ?- TSH ?- T4, free ? ?3. Petechial rash ?May apply triamcinolone cream to effected areas twice daily as needed.  ?- triamcinolone (KENALOG) 0.025 % cream; Apply 1 application. topically 2 (two) times daily.  Dispense: 80 g; Refill: 2 ? ?4. Chronic obstructive pulmonary disease, unspecified COPD type ([place]) ?Continue regular visits with pulmonology.  ? ?5. Encounter to establish care ?Appointment today to establish new primary care provider   ? ?  ?Problem List Items Addressed This Visit   ? ?  ? Respiratory  ? COPD (chronic obstructive pulmonary disease) ([place])  ?  ? Endocrine  ? Hyperthyroidism  ? Relevant Orders  ? Ambulatory referral to Endocrinology  ? T4, free  ? TSH  ? Pituitary insufficiency ([facility]) - Primary  ? Relevant Orders  ? Ambulatory referral to Endocrinology  ?  ? Musculoskeletal and Integument  ? Petechial rash  ? Relevant Medications  ? triamcinolone (KENALOG) 0.025 % cream  ? ?Other Visit Diagnoses   ? ? Encounter to establish care      ? ?  ? ? ?Outpatient Encounter Medications as of 08/31/2021  ?Medication Sig  ? methimazole (TAPAZOLE) 10 MG tablet Take 1 tablet (10 mg total) by mouth daily.  ? predniSONE (DELTASONE) 5 MG tablet Take 1 tablet (5 mg total) by mouth daily with breakfast.  ? prochlorperazine (COMPAZINE) 10 MG tablet Take 1 tablet (10 mg total) by mouth every 6 (six) hours as needed for nausea or vomiting.  ? triamcinolone (KENALOG) 0.025 % cream Apply 1 application.  topically 2 (two) times daily.  ? [DISCONTINUED] Fluticasone-Umeclidin-Vilant (TRELEGY ELLIPTA) 100-62.5-25 MCG/INH AEPB Inhale 1 puff into the lungs daily.  ? [DISCONTINUED] polyvinyl alcohol (LIQUIFILM TE

## 2021-09-01 ENCOUNTER — Encounter: Payer: Self-pay | Admitting: Nurse Practitioner

## 2021-09-01 LAB — T4, FREE: Free T4: 1.39 ng/dL (ref 0.82–1.77)

## 2021-09-01 LAB — TSH: TSH: 3.53 u[IU]/mL (ref 0.450–4.500)

## 2021-09-02 ENCOUNTER — Other Ambulatory Visit: Payer: Self-pay | Admitting: Nurse Practitioner

## 2021-09-02 ENCOUNTER — Other Ambulatory Visit: Payer: Self-pay

## 2021-09-02 DIAGNOSIS — J449 Chronic obstructive pulmonary disease, unspecified: Secondary | ICD-10-CM

## 2021-09-02 DIAGNOSIS — C4359 Malignant melanoma of other part of trunk: Secondary | ICD-10-CM

## 2021-09-02 MED ORDER — TRELEGY ELLIPTA 100-62.5-25 MCG/ACT IN AEPB: 1.0000 | INHALATION_SPRAY | Freq: Every day | RESPIRATORY_TRACT | 11 refills | Status: DC

## 2021-09-02 NOTE — Progress Notes (Signed)
Renewed trelegy and sent to [place].  ?

## 2021-09-04 ENCOUNTER — Other Ambulatory Visit: Payer: Self-pay

## 2021-09-04 ENCOUNTER — Inpatient Hospital Stay: Payer: BC Managed Care – PPO

## 2021-09-04 ENCOUNTER — Inpatient Hospital Stay: Payer: BC Managed Care – PPO | Attending: Hematology

## 2021-09-04 ENCOUNTER — Inpatient Hospital Stay (HOSPITAL_BASED_OUTPATIENT_CLINIC_OR_DEPARTMENT_OTHER): Payer: BC Managed Care – PPO | Admitting: Hematology

## 2021-09-04 VITALS — BP 119/67 | HR 59 | Temp 97.9°F | Resp 20 | Wt 169.9 lb

## 2021-09-04 DIAGNOSIS — F17210 Nicotine dependence, cigarettes, uncomplicated: Secondary | ICD-10-CM | POA: Diagnosis not present

## 2021-09-04 DIAGNOSIS — Z5112 Encounter for antineoplastic immunotherapy: Secondary | ICD-10-CM

## 2021-09-04 DIAGNOSIS — C4360 Malignant melanoma of unspecified upper limb, including shoulder: Secondary | ICD-10-CM | POA: Diagnosis not present

## 2021-09-04 DIAGNOSIS — R197 Diarrhea, unspecified: Secondary | ICD-10-CM

## 2021-09-04 DIAGNOSIS — C4359 Malignant melanoma of other part of trunk: Secondary | ICD-10-CM

## 2021-09-04 DIAGNOSIS — C799 Secondary malignant neoplasm of unspecified site: Secondary | ICD-10-CM | POA: Diagnosis not present

## 2021-09-04 DIAGNOSIS — Z79899 Other long term (current) drug therapy: Secondary | ICD-10-CM | POA: Diagnosis not present

## 2021-09-04 LAB — CMP (CANCER CENTER ONLY)
ALT: 23 U/L (ref 0–44)
AST: 21 U/L (ref 15–41)
Albumin: 3.9 g/dL (ref 3.5–5.0)
Alkaline Phosphatase: 123 U/L (ref 38–126)
Anion gap: 6 (ref 5–15)
BUN: 9 mg/dL (ref 8–23)
CO2: 24 mmol/L (ref 22–32)
Calcium: 8.8 mg/dL — ABNORMAL LOW (ref 8.9–10.3)
Chloride: 110 mmol/L (ref 98–111)
Creatinine: 0.92 mg/dL (ref 0.61–1.24)
GFR, Estimated: >60 mL/min (ref >60)
Glucose, Bld: 125 mg/dL — ABNORMAL HIGH (ref 70–99)
Potassium: 3.7 mmol/L (ref 3.5–5.1)
Sodium: 140 mmol/L (ref 135–145)
Total Bilirubin: 0.5 mg/dL (ref 0.3–1.2)
Total Protein: 7 g/dL (ref 6.5–8.1)

## 2021-09-04 LAB — CBC WITH DIFFERENTIAL (CANCER CENTER ONLY)
Abs Immature Granulocytes: 0.02 10*3/uL (ref 0.00–0.07)
Basophils Absolute: 0.1 10*3/uL (ref 0.0–0.1)
Basophils Relative: 1 %
Eosinophils Absolute: 0.4 10*3/uL (ref 0.0–0.5)
Eosinophils Relative: 4 %
HCT: 45.7 % (ref 39.0–52.0)
Hemoglobin: 15.2 g/dL (ref 13.0–17.0)
Immature Granulocytes: 0 %
Lymphocytes Relative: 38 %
Lymphs Abs: 3.2 10*3/uL (ref 0.7–4.0)
MCH: 31.7 pg (ref 26.0–34.0)
MCHC: 33.3 g/dL (ref 30.0–36.0)
MCV: 95.2 fL (ref 80.0–100.0)
Monocytes Absolute: 0.6 10*3/uL (ref 0.1–1.0)
Monocytes Relative: 7 %
Neutro Abs: 4.1 10*3/uL (ref 1.7–7.7)
Neutrophils Relative %: 50 %
Platelet Count: 166 10*3/uL (ref 150–400)
RBC: 4.8 MIL/uL (ref 4.22–5.81)
RDW: 14.3 % (ref 11.5–15.5)
WBC Count: 8.2 10*3/uL (ref 4.0–10.5)
nRBC: 0 % (ref 0.0–0.2)

## 2021-09-04 LAB — C DIFFICILE QUICK SCREEN W PCR REFLEX
C Diff antigen: NEGATIVE
C Diff interpretation: NOT DETECTED
C Diff toxin: NEGATIVE

## 2021-09-04 LAB — TSH: TSH: 3.377 u[IU]/mL (ref 0.350–4.500)

## 2021-09-04 MED ORDER — SACCHAROMYCES BOULARDII 500 MG PO PACK: 500.0000 mg | PACK | Freq: Two times a day (BID) | ORAL | 0 refills | Status: DC

## 2021-09-04 MED ORDER — LOPERAMIDE HCL 2 MG PO CAPS: 2.0000 mg | ORAL_CAPSULE | Freq: Four times a day (QID) | ORAL | 0 refills | Status: DC | PRN

## 2021-09-06 ENCOUNTER — Encounter: Payer: Self-pay | Admitting: Hematology

## 2021-09-06 LAB — GASTROINTESTINAL PANEL BY PCR, STOOL (REPLACES STOOL CULTURE)

## 2021-09-06 NOTE — Progress Notes (Signed)
? ? ?HEMATOLOGY/ONCOLOGY CLINIC NOTE ? ?Date of Service:09/04/2021 ? ? ?Patient Care Team: ?Ronnell Freshwater, NP as PCP - General (Family Medicine) ?Nahser, Wonda Cheng, MD as PCP - Cardiology (Cardiology) ? ?CHIEF COMPLAINTS/PURPOSE OF CONSULTATION:  ?Follow-up for continued management of metastatic melanoma ? ?HISTORY OF PRESENTING ILLNESS:  ?Previous notes for details of initial presentation ? ?INTERVAL HISTORY ? ?[name] is here for continued evaluation and management of metastatic melanoma and next dose of nivolumab immunotherapy. ?He notes that he has been eating reasonably well.  He has been taken off the methimazole since his thyroid functions were improving. ?He does knows increased grade 1-2 diarrhea over the last 2 weeks or so. ?He has been able to maintain p.o. hydration and food intake. ?No new lumps or bumps.  No new concerning skin lesions noted. ?No other acute new focal symptoms. ?Labs done today were reviewed with him in detail. ? ?MEDICAL HISTORY:  ?Past Medical History:  ?Diagnosis Date  ? Adrenal insufficiency ([place])   ? Cancer [facility])   ? COPD (chronic obstructive pulmonary disease) ([place])   ? Depression   ? Dyspnea   ? with 81f of ambulation. 200 feet if he walks slow.  ? Fatty liver   ? History of kidney stones   ? seen on CT Scan  ? ? ?SURGICAL HISTORY: ?Past Surgical History:  ?Procedure Laterality Date  ? MASS EXCISION Right 12/15/2020  ? Procedure: EXCISION OF MALIGNANT MASS RIGHT POSTERIOR SHOULDER;  Surgeon: BStark Klein MD;  [facility] [facility]  Service: General;  Laterality: Right;  ? WISDOM TOOTH EXTRACTION    ? ? ?SOCIAL HISTORY: ?Social History  ? ?Socioeconomic History  ? Marital status: Married  ?  Spouse name: Not on file  ? Number of children: 2  ? Years of education: Not on file  ? Highest education level: Not on file  ?Occupational History  ? Occupation: PPublic librarian ?  Comment: [employer]  ?Tobacco Use  ? Smoking status: Every Day  ?  Packs/day: 0.50   ?  Years: 40.00  ?  Pack years: 20.00  ?  Types: Cigarettes  ?  Start date: 05/17/1981  ? Smokeless tobacco: Never  ?Vaping Use  ? Vaping Use: Never used  ?Substance and Sexual Activity  ? Alcohol use: Yes  ?  Alcohol/week: 6.0 standard drinks  ?  Types: 4 Cans of beer, 2 Standard drinks or equivalent per week  ? Drug use: No  ? Sexual activity: Yes  ?  Partners: Female  ?  Birth control/protection: None  ?Other Topics Concern  ? Not on file  ?Social History Narrative  ? Married  ? 2 children--- 1 daughter/1 son  ? 6 step children  ? ?Social Determinants of Health  ? ?Financial Resource Strain: Not on file  ?Food Insecurity: Not on file  ?Transportation Needs: Not on file  ?Physical Activity: Not on file  ?Stress: Not on file  ?Social Connections: Not on file  ?Intimate Partner Violence: Not on file  ? ? ?FAMILY HISTORY: ?Family History  ?Problem Relation Age of Onset  ? Stroke Mother   ? Dementia Mother   ? Breast cancer Sister   ? Thyroid cancer Sister   ? Stroke Maternal Grandmother   ? Breast cancer Paternal Grandmother   ? Thyroid disease Neg Hx   ? ? ?ALLERGIES:  is allergic to duloxetine. ? ?MEDICATIONS:  ?Current Outpatient Medications  ?Medication Sig Dispense Refill  ? loperamide (IMODIUM) 2 MG capsule  Take 1 capsule (2 mg total) by mouth every 6 (six) hours as needed for diarrhea or loose stools (upto 4 doses a day). 30 capsule 0  ? Saccharomyces boulardii 500 MG PACK Take 500 mg by mouth in the morning and at bedtime. 20 each 0  ? Fluticasone-Umeclidin-Vilant (TRELEGY ELLIPTA) 100-62.5-25 MCG/ACT AEPB Inhale 1 puff into the lungs daily. 1 each 11  ? predniSONE (DELTASONE) 5 MG tablet Take 1 tablet (5 mg total) by mouth daily with breakfast. 90 tablet 3  ? prochlorperazine (COMPAZINE) 10 MG tablet Take 1 tablet (10 mg total) by mouth every 6 (six) hours as needed for nausea or vomiting. 30 tablet 0  ? triamcinolone (KENALOG) 0.025 % cream Apply 1 application. topically 2 (two) times daily. 80 g 2  ? ?No  current facility-administered medications for this visit.  ? ? ?REVIEW OF SYSTEMS:   ?10 Point review of Systems was done is negative except as noted above. ? ?PHYSICAL EXAMINATION: ?ECOG PERFORMANCE STATUS: 1 - Symptomatic but completely ambulatory ? ?.BP 119/67   Pulse (!) 59   Temp 97.9 ?F (36.6 ?C)   Resp 20   Wt 169 lb 14.4 oz (77.1 kg)   SpO2 98%   BMI 23.01 kg/m?  ?NAD ? ? ?GENERAL:alert, in no acute distress and comfortable ?SKIN: no acute rashes, no significant lesions ?EYES: conjunctiva are pink and non-injected, sclera anicteric ?NECK: supple, no JVD ?LYMPH:  no palpable lymphadenopathy in the cervical, axillary or inguinal regions ?LUNGS: clear to auscultation b/l with normal respiratory effort ?HEART: regular rate & rhythm ?ABDOMEN:  normoactive bowel sounds , non tender, not distended. ?Extremity: no pedal edema ?PSYCH: alert & oriented x 3 with fluent speech ?NEURO: no focal motor/sensory deficits ? ? ? ?LABORATORY DATA:  ?I have reviewed the data as listed ? ?. ? ?  Latest Ref Rng & Units 09/04/2021  ? 10:36 AM 08/14/2021  ?  8:45 AM 07/28/2021  ?  8:43 AM  ?CBC  ?WBC 4.0 - 10.5 K/uL 8.2   8.2   8.6    ?Hemoglobin 13.0 - 17.0 g/dL 15.2   14.7   14.7    ?Hematocrit 39.0 - 52.0 % 45.7   43.1   44.2    ?Platelets 150 - 400 K/uL 166   222   248    ? ?. ? ?  Latest Ref Rng & Units 09/04/2021  ? 10:36 AM 08/14/2021  ?  8:45 AM 07/28/2021  ?  8:43 AM  ?CMP  ?Glucose 70 - 99 mg/dL 125   126   140    ?BUN 8 - 23 mg/dL 9   10   15     ?Creatinine 0.61 - 1.24 mg/dL 0.92   0.93   0.92    ?Sodium 135 - 145 mmol/L 140   139   135    ?Potassium 3.5 - 5.1 mmol/L 3.7   4.2   4.2    ?Chloride 98 - 111 mmol/L 110   107   105    ?CO2 22 - 32 mmol/L 24   23   21     ?Calcium 8.9 - 10.3 mg/dL 8.8   9.1   9.2    ?Total Protein 6.5 - 8.1 g/dL 7.0   7.6   7.3    ?Total Bilirubin 0.3 - 1.2 mg/dL 0.5   0.7   0.9    ?Alkaline Phos 38 - 126 U/L 123   115   134    ?AST 15 -  41 U/L 21   19   25     ?ALT 0 - 44 U/L 23   19   24      ? ?TSH 08/31/2021 --- 3.53.  Free T4   1.39 ? ?SURGICAL PATHOLOGY  ?CASE: [phone]  ?PATIENT: George West  ?Surgical Pathology Report  ? ?Clinical History: right posterior shoulder soft tissue mass (cm)  ? ?FINAL MICROSCOPIC DIAGNOSIS:  ? ?A. SOFT TISSUE MASS, RIGHT POSTERIOR SHOULDER, NEEDLE CORE BIOPSY:  ?-  Malignant melanoma  ?-  See comment  ? ?COMMENT:  ? ?By immunohistochemistry, the neoplastic cells are positive for S100,  ?HMB45, Sox 10 and Melan-A (patchy) but negative for CD45, CD138,  ?cytokeratin 7, cytokeratin 20, cytokeratin AE1/3, desmin and EMA.  The  ?morphology and immunophenotype are consistent with malignant melanoma.  ?Dr. Irene Limbo was notified of these results on November 24, 2020.  Dr. Saralyn Pilar  ?reviewed the case and agrees with the above diagnosis.  ? ? ?RADIOGRAPHIC STUDIES: ?I have personally reviewed the radiological images as listed and agreed with the findings in the report. ?No results found. ? ? ?ASSESSMENT & PLAN:  ? ?61 yo with  ? ?1) IgM Kappa monoclonal paraproteinemia  ?? Waldenstroms macroglobulinemia ? ?2) Recently diagnosed metastatic malignant melanoma.  Unclear primary site with the patient presented with a metastatic deposit in the soft tissue/skin over the right posterior shoulder. ?Patient also has multiple slightly hypermetabolic lymph nodes-right axillary, subpectoral and right cervical.  These could be from metastatic melanoma or a possible indolent lymphoma related to his IgM kappa monoclonal paraproteinemia. ?-Molecular testing was sent on the pathology sample and is BRAF positive ?-PD-L1 testing positive for PD-L1 expression at 1% ? ?3) grade 1 diarrhea likely from ipilimumab plus nivolumab.  Resolved of Ipilimumab. 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  Rule out  infectious colitis. ? PLAN ?-Labs done today were reviewed with the patient and his wife. ?-CBC within normal limits ?CMP stable ?-Recent TSH and free T4 on 08/31/2021 were within normal limits. ?-Stool testing was sent

## 2021-09-07 NOTE — Progress Notes (Signed)
New pt referral faxed to [facility] per Dr Irene Limbo.  ?

## 2021-09-16 ENCOUNTER — Encounter: Payer: Self-pay | Admitting: Internal Medicine

## 2021-09-18 ENCOUNTER — Inpatient Hospital Stay: Payer: BC Managed Care – PPO

## 2021-09-23 ENCOUNTER — Other Ambulatory Visit: Payer: Self-pay

## 2021-09-23 DIAGNOSIS — C4359 Malignant melanoma of other part of trunk: Secondary | ICD-10-CM

## 2021-10-01 ENCOUNTER — Inpatient Hospital Stay (HOSPITAL_BASED_OUTPATIENT_CLINIC_OR_DEPARTMENT_OTHER): Payer: BC Managed Care – PPO | Admitting: Hematology

## 2021-10-01 ENCOUNTER — Inpatient Hospital Stay: Payer: BC Managed Care – PPO | Attending: Hematology

## 2021-10-01 ENCOUNTER — Encounter: Payer: Self-pay | Admitting: Hematology

## 2021-10-01 ENCOUNTER — Inpatient Hospital Stay: Payer: BC Managed Care – PPO

## 2021-10-01 VITALS — BP 122/83 | HR 94 | Temp 97.8°F | Resp 20 | Wt 166.8 lb

## 2021-10-01 DIAGNOSIS — F17210 Nicotine dependence, cigarettes, uncomplicated: Secondary | ICD-10-CM | POA: Diagnosis not present

## 2021-10-01 DIAGNOSIS — C799 Secondary malignant neoplasm of unspecified site: Secondary | ICD-10-CM | POA: Insufficient documentation

## 2021-10-01 DIAGNOSIS — C4359 Malignant melanoma of other part of trunk: Secondary | ICD-10-CM

## 2021-10-01 DIAGNOSIS — E038 Other specified hypothyroidism: Secondary | ICD-10-CM | POA: Insufficient documentation

## 2021-10-01 DIAGNOSIS — Z79899 Other long term (current) drug therapy: Secondary | ICD-10-CM | POA: Diagnosis not present

## 2021-10-01 DIAGNOSIS — R197 Diarrhea, unspecified: Secondary | ICD-10-CM | POA: Insufficient documentation

## 2021-10-01 DIAGNOSIS — R946 Abnormal results of thyroid function studies: Secondary | ICD-10-CM | POA: Insufficient documentation

## 2021-10-01 DIAGNOSIS — C4360 Malignant melanoma of unspecified upper limb, including shoulder: Secondary | ICD-10-CM | POA: Diagnosis not present

## 2021-10-01 LAB — CBC WITH DIFFERENTIAL (CANCER CENTER ONLY)
Abs Immature Granulocytes: 0.02 10*3/uL (ref 0.00–0.07)
Basophils Absolute: 0.1 10*3/uL (ref 0.0–0.1)
Basophils Relative: 1 %
Eosinophils Absolute: 0.3 10*3/uL (ref 0.0–0.5)
Eosinophils Relative: 4 %
HCT: 46.9 % (ref 39.0–52.0)
Hemoglobin: 15.9 g/dL (ref 13.0–17.0)
Immature Granulocytes: 0 %
Lymphocytes Relative: 29 %
Lymphs Abs: 2.2 10*3/uL (ref 0.7–4.0)
MCH: 32.2 pg (ref 26.0–34.0)
MCHC: 33.9 g/dL (ref 30.0–36.0)
MCV: 94.9 fL (ref 80.0–100.0)
Monocytes Absolute: 0.7 10*3/uL (ref 0.1–1.0)
Monocytes Relative: 9 %
Neutro Abs: 4.4 10*3/uL (ref 1.7–7.7)
Neutrophils Relative %: 57 %
Platelet Count: 190 10*3/uL (ref 150–400)
RBC: 4.94 MIL/uL (ref 4.22–5.81)
RDW: 13.2 % (ref 11.5–15.5)
WBC Count: 7.7 10*3/uL (ref 4.0–10.5)
nRBC: 0 % (ref 0.0–0.2)

## 2021-10-01 LAB — CMP (CANCER CENTER ONLY)
ALT: 21 U/L (ref 0–44)
AST: 21 U/L (ref 15–41)
Albumin: 4.3 g/dL (ref 3.5–5.0)
Alkaline Phosphatase: 124 U/L (ref 38–126)
Anion gap: 8 (ref 5–15)
BUN: 10 mg/dL (ref 8–23)
CO2: 24 mmol/L (ref 22–32)
Calcium: 9.3 mg/dL (ref 8.9–10.3)
Chloride: 107 mmol/L (ref 98–111)
Creatinine: 0.84 mg/dL (ref 0.61–1.24)
GFR, Estimated: >60 mL/min (ref >60)
Glucose, Bld: 104 mg/dL — ABNORMAL HIGH (ref 70–99)
Potassium: 4.1 mmol/L (ref 3.5–5.1)
Sodium: 139 mmol/L (ref 135–145)
Total Bilirubin: 0.6 mg/dL (ref 0.3–1.2)
Total Protein: 8 g/dL (ref 6.5–8.1)

## 2021-10-01 LAB — TSH: TSH: 3.325 u[IU]/mL (ref 0.350–4.500)

## 2021-10-01 MED ORDER — PANCRELIPASE (LIP-PROT-AMYL) 36000-114000 UNITS PO CPEP: ORAL_CAPSULE | ORAL | 1 refills | Status: DC

## 2021-10-02 ENCOUNTER — Other Ambulatory Visit: Payer: Self-pay

## 2021-10-02 ENCOUNTER — Inpatient Hospital Stay: Payer: BC Managed Care – PPO

## 2021-10-02 DIAGNOSIS — Z0000 Encounter for general adult medical examination without abnormal findings: Secondary | ICD-10-CM

## 2021-10-02 DIAGNOSIS — Z125 Encounter for screening for malignant neoplasm of prostate: Secondary | ICD-10-CM

## 2021-10-02 DIAGNOSIS — Z130 Encounter for screening for diseases of the blood and blood-forming organs and certain disorders involving the immune mechanism: Secondary | ICD-10-CM

## 2021-10-05 ENCOUNTER — Ambulatory Visit: Payer: BC Managed Care – PPO | Admitting: Endocrinology

## 2021-10-06 ENCOUNTER — Other Ambulatory Visit: Payer: BC Managed Care – PPO

## 2021-10-06 DIAGNOSIS — Z1329 Encounter for screening for other suspected endocrine disorder: Secondary | ICD-10-CM | POA: Diagnosis not present

## 2021-10-06 DIAGNOSIS — Z125 Encounter for screening for malignant neoplasm of prostate: Secondary | ICD-10-CM

## 2021-10-06 DIAGNOSIS — Z1321 Encounter for screening for nutritional disorder: Secondary | ICD-10-CM | POA: Diagnosis not present

## 2021-10-06 DIAGNOSIS — Z130 Encounter for screening for diseases of the blood and blood-forming organs and certain disorders involving the immune mechanism: Secondary | ICD-10-CM

## 2021-10-06 DIAGNOSIS — Z0000 Encounter for general adult medical examination without abnormal findings: Secondary | ICD-10-CM | POA: Diagnosis not present

## 2021-10-07 LAB — PSA: Prostate Specific Ag, Serum: 2.9 ng/mL (ref 0.0–4.0)

## 2021-10-07 LAB — CBC WITH DIFFERENTIAL/PLATELET
Basophils Absolute: 0.1 10*3/uL (ref 0.0–0.2)
Basos: 1 %
EOS (ABSOLUTE): 0.4 10*3/uL (ref 0.0–0.4)
Eos: 5 %
Hematocrit: 45.4 % (ref 37.5–51.0)
Hemoglobin: 15.8 g/dL (ref 13.0–17.7)
Immature Grans (Abs): 0 10*3/uL (ref 0.0–0.1)
Immature Granulocytes: 0 %
Lymphocytes Absolute: 3.5 10*3/uL — ABNORMAL HIGH (ref 0.7–3.1)
Lymphs: 42 %
MCH: 32.2 pg (ref 26.6–33.0)
MCHC: 34.8 g/dL (ref 31.5–35.7)
MCV: 93 fL (ref 79–97)
Monocytes Absolute: 0.7 10*3/uL (ref 0.1–0.9)
Monocytes: 8 %
Neutrophils Absolute: 3.8 10*3/uL (ref 1.4–7.0)
Neutrophils: 44 %
Platelets: 220 10*3/uL (ref 150–450)
RBC: 4.9 x10E6/uL (ref 4.14–5.80)
RDW: 12.6 % (ref 11.6–15.4)
WBC: 8.4 10*3/uL (ref 3.4–10.8)

## 2021-10-07 LAB — LIPID PANEL
Chol/HDL Ratio: 3.2 ratio (ref 0.0–5.0)
Cholesterol, Total: 127 mg/dL (ref 100–199)
HDL: 40 mg/dL (ref >39)
LDL Chol Calc (NIH): 62 mg/dL (ref 0–99)
Triglycerides: 144 mg/dL (ref 0–149)
VLDL Cholesterol Cal: 25 mg/dL (ref 5–40)

## 2021-10-07 LAB — HEMOGLOBIN A1C
Est. average glucose Bld gHb Est-mCnc: 120 mg/dL
Hgb A1c MFr Bld: 5.8 % — ABNORMAL HIGH (ref 4.8–5.6)

## 2021-10-07 NOTE — Progress Notes (Signed)
Labs generally good. Review with patient during visit 10/13/2021.

## 2021-10-08 ENCOUNTER — Encounter: Payer: Self-pay | Admitting: Hematology

## 2021-10-08 NOTE — Progress Notes (Addendum)
HEMATOLOGY/ONCOLOGY CLINIC NOTE  Date of Service:10/01/2021   Patient Care Team: Ronnell Freshwater, NP as PCP - General (Family Medicine) Nahser, Wonda Cheng, MD as PCP - Cardiology (Cardiology)  CHIEF COMPLAINTS/PURPOSE OF CONSULTATION:  Follow-up for continued management of metastatic melanoma  HISTORY OF PRESENTING ILLNESS:  Previous notes for details of initial presentation  INTERVAL HISTORY  Mr Numair Masden is here for continued valuation and management of his metastatic melanoma.  He continues to have loose stools 3-4 times a day.  No blood in the stools or black stools. He was seen by gastroenterology but is awaiting a colonoscopy at this time.  We discussed and decided to hold off on his immunotherapy pending his colonoscopy.  We discussed that if his diarrhea was getting worse we would have to start him on budesonide. His previous stool studies including GI PCR panel and C. difficile were unrevealing. Labs in today were reviewed in detail with the patient.  MEDICAL HISTORY:  Past Medical History:  Diagnosis Date   Adrenal insufficiency (HCC)    Cancer (HCC)    COPD (chronic obstructive pulmonary disease) (HCC)    Depression    Dyspnea    with 32f of ambulation. 200 feet if he walks slow.   Fatty liver    History of kidney stones    seen on CT Scan    SURGICAL HISTORY: Past Surgical History:  Procedure Laterality Date   MASS EXCISION Right 12/15/2020   Procedure: EXCISION OF MALIGNANT MASS RIGHT POSTERIOR SHOULDER;  Surgeon: BStark Klein MD;  [place] [facility]  Service: General;  Laterality: Right;   WISDOM TOOTH EXTRACTION      SOCIAL HISTORY: Social History   Socioeconomic History   Marital status: Married    Spouse name: Not on file   Number of children: 2   Years of education: Not on file   Highest education level: Not on file  Occupational History   Occupation: PPublic librarian   [place] [employer]  Tobacco Use   Smoking  status: Every Day    Packs/day: 0.50    Years: 40.00    Pack years: 20.00    Types: Cigarettes    Start date: 05/17/1981   Smokeless tobacco: Never  Vaping Use   Vaping Use: Never used  Substance and Sexual Activity   Alcohol use: Yes    Alcohol/week: 6.0 standard drinks    Types: 4 Cans of beer, 2 Standard drinks or equivalent per week   Drug use: No   Sexual activity: Yes    Partners: Female    Birth control/protection: None  Other Topics Concern   Not on file  Social History Narrative   Married   2 children--- 1 daughter/1 son   6 step children   Social Determinants of Health   Financial Resource Strain: Not on file  Food Insecurity: Not on file  Transportation Needs: Not on file  Physical Activity: Not on file  Stress: Not on file  Social Connections: Not on file  Intimate Partner Violence: Not on file    FAMILY HISTORY: Family History  Problem Relation Age of Onset   Stroke Mother    Dementia Mother    Breast cancer Sister    Thyroid cancer Sister    Stroke Maternal Grandmother    Breast cancer Paternal Grandmother    Thyroid disease Neg Hx     ALLERGIES:  is allergic to duloxetine.  MEDICATIONS:  Current Outpatient Medications  Medication Sig Dispense  Refill   lipase/protease/amylase (CREON) 36000 UNITS CPEP capsule Take 1 capsule (36,000 Units total) by mouth 3 (three) times daily with meals. May also take 1 capsule (36,000 Units total) as needed (with snacks). 90 capsule 1   Fluticasone-Umeclidin-Vilant (TRELEGY ELLIPTA) 100-62.5-25 MCG/ACT AEPB Inhale 1 puff into the lungs daily. 1 each 11   loperamide (IMODIUM) 2 MG capsule Take 1 capsule (2 mg total) by mouth every 6 (six) hours as needed for diarrhea or loose stools (upto 4 doses a day). 30 capsule 0   predniSONE (DELTASONE) 5 MG tablet Take 1 tablet (5 mg total) by mouth daily with breakfast. 90 tablet 3   prochlorperazine (COMPAZINE) 10 MG tablet Take 1 tablet (10 mg total) by mouth every 6 (six)  hours as needed for nausea or vomiting. 30 tablet 0   Saccharomyces boulardii 500 MG PACK Take 500 mg by mouth in the morning and at bedtime. 20 each 0   triamcinolone (KENALOG) 0.025 % cream Apply 1 application. topically 2 (two) times daily. 80 g 2   No current facility-administered medications for this visit.    REVIEW OF SYSTEMS:   10 Point review of Systems was done is negative except as noted above.  PHYSICAL EXAMINATION: ECOG PERFORMANCE STATUS: 1 - Symptomatic but completely ambulatory  .BP 122/83   Pulse 94   Temp 97.8 F (36.6 C)   Resp 20   Wt 166 lb 12.8 oz (75.7 kg)   SpO2 99%   BMI 22.59 kg/m  NAD NAD GENERAL:alert, in no acute distress and comfortable SKIN: no acute rashes, no significant lesions EYES: conjunctiva are pink and non-injected, sclera anicteric OROPHARYNX: MMM, no exudates, no oropharyngeal erythema or ulceration NECK: supple, no JVD LYMPH:  no palpable lymphadenopathy in the cervical, axillary or inguinal regions LUNGS: clear to auscultation b/l with normal respiratory effort HEART: regular rate & rhythm ABDOMEN:  normoactive bowel sounds , non tender, not distended. Extremity: no pedal edema PSYCH: alert & oriented x 3 with fluent speech NEURO: no focal motor/sensory deficits   LABORATORY DATA:  I have reviewed the data as listed  .    Latest Ref Rng & Units 10/06/2021    8:34 AM 10/01/2021   10:01 AM 09/04/2021   10:36 AM  CBC  WBC 3.4 - 10.8 x10E3/uL 8.4   7.7   8.2    Hemoglobin 13.0 - 17.7 g/dL 15.8   15.9   15.2    Hematocrit 37.5 - 51.0 % 45.4   46.9   45.7    Platelets 150 - 450 x10E3/uL 220   190   166     .    Latest Ref Rng & Units 10/01/2021   10:01 AM 09/04/2021   10:36 AM 08/14/2021    8:45 AM  CMP  Glucose 70 - 99 mg/dL 104   125   126    BUN 8 - 23 mg/dL 10   9   10     Creatinine 0.61 - 1.24 mg/dL 0.84   0.92   0.93    Sodium 135 - 145 mmol/L 139   140   139    Potassium 3.5 - 5.1 mmol/L 4.1   3.7   4.2     Chloride 98 - 111 mmol/L 107   110   107    CO2 22 - 32 mmol/L 24   24   23     Calcium 8.9 - 10.3 mg/dL 9.3   8.8   9.1  Total Protein 6.5 - 8.1 g/dL 8.0   7.0   7.6    Total Bilirubin 0.3 - 1.2 mg/dL 0.6   0.5   0.7    Alkaline Phos 38 - 126 U/L 124   123   115    AST 15 - 41 U/L 21   21   19     ALT 0 - 44 U/L 21   23   19      TSH 08/31/2021 --- 3.53.  Free T4   1.39  SURGICAL PATHOLOGY  CASE: [id number]  PATIENT: Ronelle Yeley  Surgical Pathology Report   Clinical History: right posterior shoulder soft tissue mass (cm)   FINAL MICROSCOPIC DIAGNOSIS:   A. SOFT TISSUE MASS, RIGHT POSTERIOR SHOULDER, NEEDLE CORE BIOPSY:  -  Malignant melanoma  -  See comment   COMMENT:   By immunohistochemistry, the neoplastic cells are positive for S100,  HMB45, Sox 10 and Melan-A (patchy) but negative for CD45, CD138,  cytokeratin 7, cytokeratin 20, cytokeratin AE1/3, desmin and EMA.  The  morphology and immunophenotype are consistent with malignant melanoma.  Dr. Irene Limbo was notified of these results on November 24, 2020.  Dr. Saralyn Pilar  reviewed the case and agrees with the above diagnosis.    RADIOGRAPHIC STUDIES: I have personally reviewed the radiological images as listed and agreed with the findings in the report. No results found.   ASSESSMENT & PLAN:   61 yo with   1) 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  Unclear primary site with the patient presented with a metastatic deposit in the soft tissue/skin over the right posterior shoulder. Patient also has multiple slightly hypermetabolic lymph nodes-right axillary, subpectoral and right cervical.  These could be from metastatic melanoma or a possible indolent lymphoma related to his IgM kappa monoclonal paraproteinemia. 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  GI panel and C. difficile testing negative  PLAN Labs done today were discussed in detail with the patient Patient still having diarrhea and we shall hold off on his maintenance dose of nivolumab at this time. -He is pending GI work-up with colonoscopy to evaluate for immune colitis -We discussed that if his diarrhea was getting much worse we will start him empirically on budesonide pending his colonoscopy but ideally would like to have a colonoscopy prior to reconsidering immunotherapy -We will see the patient back after his colonoscopy at this time.  Patient will call us after he has his colonoscopy.  No orders of the defined types were placed in this encounter.   FOLLOW UP Holding nivolumab at this time.  Patient will call us after his colonoscopy for consideration of restarting his immunotherapy based on results.

## 2021-10-13 ENCOUNTER — Ambulatory Visit (INDEPENDENT_AMBULATORY_CARE_PROVIDER_SITE_OTHER): Payer: BC Managed Care – PPO | Admitting: Nurse Practitioner

## 2021-10-13 ENCOUNTER — Encounter: Payer: Self-pay | Admitting: Nurse Practitioner

## 2021-10-13 VITALS — BP 107/74 | HR 77 | Temp 98.0°F | Ht 72.05 in | Wt 166.1 lb

## 2021-10-13 DIAGNOSIS — Z1211 Encounter for screening for malignant neoplasm of colon: Secondary | ICD-10-CM

## 2021-10-13 DIAGNOSIS — Z125 Encounter for screening for malignant neoplasm of prostate: Secondary | ICD-10-CM

## 2021-10-13 DIAGNOSIS — C4359 Malignant melanoma of other part of trunk: Secondary | ICD-10-CM | POA: Diagnosis not present

## 2021-10-13 DIAGNOSIS — R7301 Impaired fasting glucose: Secondary | ICD-10-CM

## 2021-10-13 DIAGNOSIS — Z0001 Encounter for general adult medical examination with abnormal findings: Secondary | ICD-10-CM | POA: Diagnosis not present

## 2021-10-13 DIAGNOSIS — E230 Hypopituitarism: Secondary | ICD-10-CM

## 2021-10-13 DIAGNOSIS — E0590 Thyrotoxicosis, unspecified without thyrotoxic crisis or storm: Secondary | ICD-10-CM

## 2021-10-13 DIAGNOSIS — J449 Chronic obstructive pulmonary disease, unspecified: Secondary | ICD-10-CM

## 2021-10-13 NOTE — Progress Notes (Signed)
Complete physical exam   Patient: George West   DOB: 03-Jan-1961   61 y.o. Male  MRN: [id number] Visit Date: 10/13/2021    Chief Complaint  Patient presents with   Annual Exam   Subjective    George West is a 61 y.o. male who presents today for a complete physical exam.  He reports consuming a general diet. The patient does not participate in regular exercise at present. He generally feels well. He does not have additional problems to discuss today.   HPI  Annual physical.  -routine, fasting labs done prior to this visit,  -mild elevation of HgbA1c 5.8. with blood sugar at 104. -other labs were good.  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 He states that endocrinology provider wither retired, left the practice, or passed away. He does need a new endocrinologist.  -would like referral for screening colonoscopy.  -ingrowing toenails of both toenails. Continues to have some problems with the left toenail. Stays sore. States that he continues to mess with it. Getting slightly better. Less painful.  -chronic treatment for malignant melanoma. Had to stop immunotherapy due to painful gas and diarrhea. Is scheduled to see GI provider on Friday.   Past Medical History:  Diagnosis Date   Adrenal insufficiency (HCC)    Cancer (HCC)    COPD (chronic obstructive pulmonary disease) (HCC)    Depression    Dyspnea    with 69f of ambulation. 200 feet if he walks slow.   Fatty liver    History of kidney stones    seen on CT Scan   Past Surgical History:  Procedure Laterality Date   MASS EXCISION Right 12/15/2020   Procedure: EXCISION OF MALIGNANT MASS RIGHT POSTERIOR SHOULDER;  Surgeon: BStark Klein MD;  [place] [facility]  Service: General;  Laterality: Right;   WISDOM TOOTH EXTRACTION     Social History   Socioeconomic History   Marital status: Married    Spouse name: Not on file   Number of children: 2   Years of education: Not on file    Highest education level: Not on file  Occupational History   Occupation: PPublic librarian   Comment: [employer] Tobacco Use   Smoking status: Every Day    Packs/day: 0.50    Years: 40.00    Pack years: 20.00    Types: Cigarettes    Start date: 05/17/1981   Smokeless tobacco: Never  Vaping Use   Vaping Use: Never used  Substance and Sexual Activity   Alcohol use: Yes    Alcohol/week: 6.0 standard drinks    Types: 4 Cans of beer, 2 Standard drinks or equivalent per week   Drug use: No   Sexual activity: Yes    Partners: Female    Birth control/protection: None  Other Topics Concern   Not on file  Social History Narrative   Married   2 children--- 1 daughter/1 son   6 step children   Social Determinants of Health   Financial Resource Strain: Not on file  Food Insecurity: Not on file  Transportation Needs: Not on file  Physical Activity: Not on file  Stress: Not on file  Social Connections: Not on file  Intimate Partner Violence: Not on file   Family Status  Relation Name Status   Mother  Alive   Father  Alive   Sister  Alive   Sister  Alive   MGM  Deceased   MGF  Deceased  PGM  Deceased   PGF  Deceased       brain aneurysm   Neg Hx  (Not Specified)   Family History  Problem Relation Age of Onset   Stroke Mother    Dementia Mother    Breast cancer Sister    Thyroid cancer Sister    Stroke Maternal Grandmother    Breast cancer Paternal Grandmother    Thyroid disease Neg Hx    Allergies  Allergen Reactions   Duloxetine     dizziness    Patient Care Team: Ronnell Freshwater, NP as PCP - General (Family Medicine) Nahser, Wonda Cheng, MD as PCP - Cardiology (Cardiology)   Medications: Outpatient Medications Prior to Visit  Medication Sig   Fluticasone-Umeclidin-Vilant (TRELEGY ELLIPTA) 100-62.5-25 MCG/ACT AEPB Inhale 1 puff into the lungs daily.   lipase/protease/amylase (CREON) 36000 UNITS CPEP capsule Take 1 capsule (36,000 Units total)  by mouth 3 (three) times daily with meals. May also take 1 capsule (36,000 Units total) as needed (with snacks).   loperamide (IMODIUM) 2 MG capsule Take 1 capsule (2 mg total) by mouth every 6 (six) hours as needed for diarrhea or loose stools (upto 4 doses a day).   predniSONE (DELTASONE) 5 MG tablet Take 1 tablet (5 mg total) by mouth daily with breakfast.   Saccharomyces boulardii 500 MG PACK Take 500 mg by mouth in the morning and at bedtime.   triamcinolone (KENALOG) 0.025 % cream Apply 1 application. topically 2 (two) times daily.   [DISCONTINUED] prochlorperazine (COMPAZINE) 10 MG tablet Take 1 tablet (10 mg total) by mouth every 6 (six) hours as needed for nausea or vomiting.   No facility-administered medications prior to visit.    Review of Systems  Constitutional:  Negative for activity change, chills, fatigue and fever.  HENT:  Negative for congestion, postnasal drip, rhinorrhea, sinus pressure, sinus pain, sneezing and sore throat.   Eyes: Negative.   Respiratory:  Positive for shortness of breath and wheezing. Negative for cough.   Cardiovascular:  Negative for chest pain and palpitations.  Gastrointestinal:  Negative for constipation, diarrhea, nausea and vomiting.  Endocrine: Negative for cold intolerance, heat intolerance, polydipsia and polyuria.       History of hyperthyroid and pituitary insufficiency. Needs to have new endocrinologist.   Genitourinary:  Negative for dysuria, frequency and urgency.  Musculoskeletal:  Negative for back pain and myalgias.  Skin:  Negative for rash.  Allergic/Immunologic: Negative for environmental allergies.  Neurological:  Negative for dizziness, weakness and headaches.  Psychiatric/Behavioral:  The patient is not nervous/anxious.    Last CBC Lab Results  Component Value Date   WBC 8.4 10/06/2021   HGB 15.8 10/06/2021   HCT 45.4 10/06/2021   MCV 93 10/06/2021   MCH 32.2 10/06/2021   RDW 12.6 10/06/2021   PLT 220 81/44/8185    Last metabolic panel Lab Results  Component Value Date   GLUCOSE 104 (H) 10/01/2021   NA 139 10/01/2021   K 4.1 10/01/2021   CL 107 10/01/2021   CO2 24 10/01/2021   BUN 10 10/01/2021   CREATININE 0.84 10/01/2021   GFRNONAA >60 10/01/2021   CALCIUM 9.3 10/01/2021   PHOS 3.6 06/16/2021   PROT 8.0 10/01/2021   ALBUMIN 4.3 10/01/2021   LABGLOB 3.0 12/03/2020   AGRATIO 1.5 08/25/2017   BILITOT 0.6 10/01/2021   ALKPHOS 124 10/01/2021   AST 21 10/01/2021   ALT 21 10/01/2021   ANIONGAP 8 10/01/2021   Last lipids Lab Results  Component Value Date   CHOL 127 10/06/2021   HDL 40 10/06/2021   LDLCALC 62 10/06/2021   TRIG 144 10/06/2021   CHOLHDL 3.2 10/06/2021   Last hemoglobin A1c Lab Results  Component Value Date   HGBA1C 5.8 (H) 10/06/2021   Last thyroid functions Lab Results  Component Value Date   TSH 3.325 10/01/2021       Objective     Today's Vitals   10/13/21 0834  BP: 107/74  Pulse: 77  Temp: 98 F (36.7 C)  SpO2: 97%  Weight: 166 lb 1.9 oz (75.4 kg)  Height: 6' 0.05" (1.83 m)   Body mass index is 22.5 kg/m.    BP Readings from Last 3 Encounters:  10/13/21 107/74  10/01/21 122/83  09/04/21 119/67    Wt Readings from Last 3 Encounters:  10/13/21 166 lb 1.9 oz (75.4 kg)  10/01/21 166 lb 12.8 oz (75.7 kg)  09/04/21 169 lb 14.4 oz (77.1 kg)     Physical Exam Vitals and nursing note reviewed.  Constitutional:      Appearance: Normal appearance. He is well-developed.  HENT:     Head: Normocephalic and atraumatic.     Right Ear: Tympanic membrane, ear canal and external ear normal.     Left Ear: Tympanic membrane, ear canal and external ear normal.     Nose: Nose normal.     Mouth/Throat:     Mouth: Mucous membranes are moist.     Pharynx: Oropharynx is clear.  Eyes:     Extraocular Movements: Extraocular movements intact.     Conjunctiva/sclera: Conjunctivae normal.     Pupils: Pupils are equal, round, and reactive to light.  Neck:      Vascular: No carotid bruit.  Cardiovascular:     Rate and Rhythm: Normal rate and regular rhythm.     Pulses: Normal pulses.     Heart sounds: Normal heart sounds.  Pulmonary:     Effort: Pulmonary effort is normal.     Breath sounds: Normal breath sounds.  Abdominal:     General: Bowel sounds are normal. There is no distension.     Palpations: Abdomen is soft. There is no mass.     Tenderness: There is no abdominal tenderness. There is no right CVA tenderness, left CVA tenderness, guarding or rebound.     Hernia: No hernia is present.  Musculoskeletal:        General: Normal range of motion.     Cervical back: Normal range of motion and neck supple.  Lymphadenopathy:     Cervical: No cervical adenopathy.  Skin:    General: Skin is warm and dry.     Capillary Refill: Capillary refill takes less than 2 seconds.  Neurological:     General: No focal deficit present.     Mental Status: He is alert and oriented to person, place, and time.  Psychiatric:        Mood and Affect: Mood normal.        Behavior: Behavior normal.        Thought Content: Thought content normal.        Judgment: Judgment normal.      Last depression screening scores    10/13/2021    8:37 AM 08/31/2021    8:33 AM 02/20/2020   11:40 AM  PHQ 2/9 Scores  PHQ - 2 Score 0 1 0  PHQ- 9 Score 0 3    Last fall risk screening    08/31/2021  8:34 AM  Fall Risk   Falls in the past year? 0  Number falls in past yr: 0  Injury with Fall? 0  Follow up Falls evaluation completed   Last Audit-C alcohol use screening     View : No data to display.           Assessment & Plan    1. Encounter for general adult medical examination with abnormal findings Annual physical today   2. Pituitary insufficiency ([place]) Patient with history of pituitary insufficiency. Needs to have new endocrinology provider. Referral placed today  - Ambulatory referral to Endocrinology  3. Hyperthyroidism Patient has  historically taken tapazole for hyperthyroid. States that he does need referral for new endocrinologist. Referral placed today.  - Ambulatory referral to Endocrinology  4. Malignant melanoma of torso excluding breast [facility]) Patient should continue regular visits with oncology for management.   5. Chronic obstructive pulmonary disease, unspecified COPD type ([place]) Stable. Continue inhalers and respiratory medication as prescribed   6. Screening for colon cancer Referral for screening colonoscopy made today.  - Ambulatory referral to Gastroenterology     Immunization History  Administered Date(s) Administered   Pneumococcal Conjugate-13 12/22/2007   Tdap 02/20/2020    Health Maintenance  Topic Date Due   Hepatitis C Screening  Never done   Zoster Vaccines- Shingrix (1 of 2) Never done   COLONOSCOPY (Pts 45-24yr Insurance coverage will need to be confirmed)  Never done   INFLUENZA VACCINE  12/15/2021   TETANUS/TDAP  02/19/2030   HIV Screening  Completed   HPV VACCINES  Aged Out   COVID-19 Vaccine  Discontinued    Discussed health benefits of physical activity, and encouraged him to engage in regular exercise appropriate for his age and condition.  Problem List Items Addressed This Visit       Respiratory   [place] disease) [place]     Endocrine   Hyperthyroidism   Relevant Orders   Ambulatory referral to Endocrinology   Pituitary insufficiency ([facility]   Relevant Orders   Ambulatory referral to Endocrinology     Other   Malignant melanoma of torso excluding breast ([place]   Other Visit Diagnoses     Encounter for general adult medical examination with abnormal findings    -  Primary   Screening for colon cancer       Relevant Orders   Ambulatory referral to Gastroenterology        Return in about 6 months (around 04/15/2022) for COPD. FBW a week prior to visit - needs PSA with no thyroid panel .        HRonnell Freshwater NP  [phone](phone) [phone](fax)  [place]

## 2021-10-16 ENCOUNTER — Encounter: Payer: Self-pay | Admitting: Hematology

## 2021-10-16 ENCOUNTER — Other Ambulatory Visit (INDEPENDENT_AMBULATORY_CARE_PROVIDER_SITE_OTHER): Payer: BC Managed Care – PPO

## 2021-10-16 ENCOUNTER — Ambulatory Visit (INDEPENDENT_AMBULATORY_CARE_PROVIDER_SITE_OTHER): Payer: BC Managed Care – PPO | Admitting: Internal Medicine

## 2021-10-16 ENCOUNTER — Encounter: Payer: Self-pay | Admitting: Internal Medicine

## 2021-10-16 VITALS — BP 120/80 | HR 72 | Ht 71.0 in | Wt 166.1 lb

## 2021-10-16 DIAGNOSIS — R197 Diarrhea, unspecified: Secondary | ICD-10-CM

## 2021-10-16 DIAGNOSIS — C4359 Malignant melanoma of other part of trunk: Secondary | ICD-10-CM

## 2021-10-16 DIAGNOSIS — R1084 Generalized abdominal pain: Secondary | ICD-10-CM | POA: Diagnosis not present

## 2021-10-16 LAB — C-REACTIVE PROTEIN: CRP: <1 mg/dL (ref 0.5–20.0)

## 2021-10-16 LAB — LIPASE: Lipase: 13 U/L (ref 11.0–59.0)

## 2021-10-16 NOTE — Progress Notes (Signed)
Chief Complaint: Diarrhea  HPI : 61 year old male with history of metastatic melanoma, COPD, fatty liver, adrenal insufficiency, HFpEF presents with diarrhea  Patient states that he was first diagnosed with melanoma about 2 years ago.  Then starting about 2 months ago he started developing diarrhea.  As result his immunotherapy was discontinued 6 weeks ago.  When his diarrhea for started, he was having 1 bowel movement every 2 hours.  About 2 weeks ago he was started on Creon by Dr. Irene Limbo and that has helped with the frequency of his bowel movements.  He is only having about 4-5 bowel movements per day currently.  The bowel movements only occur when he is awake.  His BMs looked greasy when they came out.  Denies blood in the stools.  Over the last 2 to 3 months he has lost about 15 pounds.  Imodium did not help at all with his diarrhea symptoms.  He does also endorse some generalized abdominal pain denies nausea, vomiting, dysphagia, GERD.  Denies family history of GI issues.  Has never had a prior colonoscopy.  He did have an EGD done 10 years ago that showed gastritis.  He does drink about 3-4 alcoholic beverages per day and has done so for the last 20 years.  Wt Readings from Last 3 Encounters:  10/16/21 166 lb 2 oz (75.4 kg)  10/13/21 166 lb 1.9 oz (75.4 kg)  10/01/21 166 lb 12.8 oz (75.7 kg)    Past Medical History:  Diagnosis Date   Adrenal insufficiency (HCC)    COPD (chronic obstructive pulmonary disease) (HCC)    Depression    Dyspnea    with 16f of ambulation. 200 feet if he walks slow.   Fatty liver    History of kidney stones    seen on CT Scan   Skin cancer (melanoma) (HCC)    Thyroid disease      Past Surgical History:  Procedure Laterality Date   MASS EXCISION Right 12/15/2020   Procedure: EXCISION OF MALIGNANT MASS RIGHT POSTERIOR SHOULDER;  Surgeon: BStark Klein MD;  Location: [facility]  Service: General;  Laterality: Right;   WISDOM TOOTH EXTRACTION     Family  History  Problem Relation Age of Onset   Stroke Mother    Dementia Mother    Breast cancer Sister    Thyroid cancer Sister    Ovarian cancer Sister    Stroke Maternal Grandmother    Breast cancer Paternal Grandmother    Diabetes Paternal Aunt    Diabetes Paternal Uncle    Hypertension Daughter    Other Daughter        Uterine cysts   Thyroid disease Neg Hx    Social History   Tobacco Use   Smoking status: Every Day    Packs/day: 0.50    Years: 40.00    Pack years: 20.00    Types: Cigarettes    Start date: 05/17/1981   Smokeless tobacco: Never  Vaping Use   Vaping Use: Never used  Substance Use Topics   Alcohol use: Yes    Alcohol/week: 6.0 standard drinks    Types: 4 Cans of beer, 2 Standard drinks or equivalent per week    Comment: 4 per day   Drug use: No   Current Outpatient Medications  Medication Sig Dispense Refill   Fluticasone-Umeclidin-Vilant (TRELEGY ELLIPTA) 100-62.5-25 MCG/ACT AEPB Inhale 1 puff into the lungs daily. 1 each 11   lipase/protease/amylase (CREON) 36000 UNITS CPEP capsule Take 1  capsule (36,000 Units total) by mouth 3 (three) times daily with meals. May also take 1 capsule (36,000 Units total) as needed (with snacks). 90 capsule 1   loperamide (IMODIUM) 2 MG capsule Take 1 capsule (2 mg total) by mouth every 6 (six) hours as needed for diarrhea or loose stools (upto 4 doses a day). 30 capsule 0   predniSONE (DELTASONE) 5 MG tablet Take 1 tablet (5 mg total) by mouth daily with breakfast. 90 tablet 3   Saccharomyces boulardii 500 MG PACK Take 500 mg by mouth in the morning and at bedtime. 20 each 0   triamcinolone (KENALOG) 0.025 % cream Apply 1 application. topically 2 (two) times daily. 80 g 2   No current facility-administered medications for this visit.   Allergies  Allergen Reactions   Duloxetine     dizziness   Review of Systems: All systems reviewed and negative except where noted in HPI.   Physical Exam: BP 120/80 (BP Location: Left  Arm, Patient Position: Sitting, Cuff Size: Normal)   Pulse 72   Ht '5\' 11"'$  (1.803 m)   Wt 166 lb 2 oz (75.4 kg)   BMI 23.17 kg/m  Constitutional: Pleasant,well-developed, male in no acute distress. HEENT: Normocephalic and atraumatic. Conjunctivae are normal. No scleral icterus. Cardiovascular: Normal rate, regular rhythm.  Pulmonary/chest: Effort normal and breath sounds normal. No wheezing, rales or rhonchi. Abdominal: Soft, nondistended, nontender. Bowel sounds active throughout. There are no masses palpable. No hepatomegaly. Extremities: No edema Neurological: Alert and oriented to person place and time. Skin: Skin is warm and dry. No rashes noted. Psychiatric: Normal mood and affect. Behavior is normal.  Labs 08/2021: C dif negative. GI path panel negative  Labs 09/2021: CBC nml. TSH nml. CMP nml.   CTA PE 06/15/21: IMPRESSION: There is no evidence of pulmonary artery embolism. There is no focal pulmonary consolidation. Subtle increased interstitial markings are seen in the posterior lower lung fields suggesting scarring or interstitial pneumonitis. There is mild stranding in the fat planes adjacent to body and tail of pancreas. This may be residual scarring from previous inflammation or suggest mild acute pancreatitis. Please correlate with laboratory findings. There are multiple foci of cortical thinning in the right kidney suggesting scarring from chronic pyelonephritis.  TTE 06/16/21: IMPRESSIONS   1. Left ventricular ejection fraction, by estimation, is 60 to 65%. The  left ventricle has normal function. The left ventricle has no regional  wall motion abnormalities. Left ventricular diastolic parameters are  consistent with Grade II diastolic  dysfunction (pseudonormalization).   2. Right ventricular systolic function is normal. The right ventricular  size is normal. There is normal pulmonary artery systolic pressure. The  estimated right ventricular systolic pressure  is 21.3 mmHg.   3. The mitral valve is normal in structure. Trivial mitral valve  regurgitation. No evidence of mitral stenosis.   4. The aortic valve is tricuspid. There is mild calcification of the  aortic valve. Aortic valve regurgitation is not visualized. Aortic valve  sclerosis is present, with no evidence of aortic valve stenosis.   5. The inferior vena cava is normal in size with greater than 50%  respiratory variability, suggesting right atrial pressure of 3 mmHg.   ASSESSMENT AND PLAN: Diarrhea Generalized ab pain Patient presents with diarrhea while he was on immunotherapy.  His last chest CT interestingly did show some mild fat stranding in the body and tail of the pancreas, which could suggest pancreatitis.  Patient also describes some generalized and epigastric abdominal  pain that could be consistent with pancreatitis as well.  He has responded to Creon therapy, which could suggest that he has some degree of pancreatic insufficiency.  He does have a substantial alcohol use history.  He has already had negative infectious stool studies.  Will plan to look for further etiologies of his diarrhea.  Will plan for CT A/P to take a better look at his pancreas.  Will also plan for a colonoscopy to assess for immune checkpoint inhibitor induced colitis. - Check CRP, TTG IgA, IgA, lipase - Check fecal elastase, fecal calprotectin, stool H pylori - Check CT A/P w/contrast - Encourage alcohol cessation - Colonoscopy [facility]  George Reading, MD  I spent 62 minutes of time, including in depth chart review, independent review of results as outlined above, communicating results with the patient directly, face-to-face time with the patient, coordinating care, ordering studies and medications as appropriate, and documentation.

## 2021-10-16 NOTE — Patient Instructions (Addendum)
If you are age 62 or older, your body mass index should be between 23-30. Your Body mass index is 23.17 kg/m. If this is out of the aforementioned range listed, please consider follow up with your Primary Care Provider.  If you are age 52 or younger, your body mass index should be between 19-25. Your Body mass index is 23.17 kg/m. If this is out of the aformentioned range listed, please consider follow up with your Primary Care Provider.   You will be contacted by [facility] in the next 2 days to arrange a             .  The number on your caller ID will be [phone], please answer when they call.  If you have not heard from them in 2 days please call [phone] to schedule.    You have been scheduled for a CT scan of the abdomen and pelvis at [facility], 1st floor Radiology. You are scheduled on                    at               . You should arrive 15 minutes prior to your appointment time for registration.  Please pick up 2 bottles of contrast from [facility] at least 3 days prior to your scan. The solution may taste better if refrigerated, but do NOT add ice or any other liquid to this solution. Shake well before drinking.   Please follow the written instructions below on the day of your exam:   1) Do not eat anything after                       (4 hours prior to your test)   2) Drink 1 bottle of contrast @                      (2 hours prior to your exam)  Remember to shake well before drinking and do NOT pour over ice.     Drink 1 bottle of contrast @                 (1 hour prior to your exam)   You may take any medications as prescribed with a small amount of water, if necessary. If you take any of the following medications: METFORMIN, GLUCOPHAGE, GLUCOVANCE, AVANDAMET, RIOMET, FORTAMET, [place] MET, JANUMET, GLUMETZA or METAGLIP, you MAY be asked to HOLD this medication 48 hours AFTER the exam.   The purpose of you drinking the oral contrast is to aid  in the visualization of your intestinal tract. The contrast solution may cause some diarrhea. Depending on your individual set of symptoms, you may also receive an intravenous injection of x-ray contrast/dye. Plan on being at [facility] for 45 minutes or longer, depending on the type of exam you are having performed.   If you have any questions regarding your exam or if you need to reschedule, you may call [facility] at [phone] between the hours of 8:00 am and 5:00 pm, Monday-Friday.     You have been scheduled for a colonoscopy. Please follow written instructions given to you at your visit today.  Please pick up your prep supplies at the pharmacy within the next 1-3 days. If you use inhalers (even only as needed), please bring them with you on the day of your procedure.  Your provider  has requested that you go to the basement level for lab work before leaving today. Press "B" on the elevator. The lab is located at the first door on the left as you exit the elevator.  The [facility] providers would like to encourage you to use [facility] to communicate with providers for non-urgent requests or questions.  Due to long hold times on the telephone, sending your provider a message by [facility] may be a faster and more efficient way to get a response.  Please allow 48 business hours for a response.  Please remember that this is for non-urgent requests.   Thank you for entrusting me with your care and for choosing [facility], Dr. [name]

## 2021-10-17 LAB — TISSUE TRANSGLUTAMINASE, IGA: (tTG) Ab, IgA: <1 U/mL

## 2021-10-17 LAB — IGA: Immunoglobulin A: 261 mg/dL (ref 70–320)

## 2021-10-19 ENCOUNTER — Ambulatory Visit (AMBULATORY_SURGERY_CENTER): Payer: BC Managed Care – PPO | Admitting: Internal Medicine

## 2021-10-19 ENCOUNTER — Encounter: Payer: Self-pay | Admitting: Internal Medicine

## 2021-10-19 VITALS — BP 132/78 | HR 52 | Temp 98.7°F | Resp 14 | Ht 71.0 in | Wt 166.0 lb

## 2021-10-19 DIAGNOSIS — D120 Benign neoplasm of cecum: Secondary | ICD-10-CM | POA: Diagnosis not present

## 2021-10-19 DIAGNOSIS — D122 Benign neoplasm of ascending colon: Secondary | ICD-10-CM | POA: Diagnosis not present

## 2021-10-19 DIAGNOSIS — K635 Polyp of colon: Secondary | ICD-10-CM | POA: Diagnosis not present

## 2021-10-19 DIAGNOSIS — R197 Diarrhea, unspecified: Secondary | ICD-10-CM

## 2021-10-19 DIAGNOSIS — D125 Benign neoplasm of sigmoid colon: Secondary | ICD-10-CM | POA: Diagnosis not present

## 2021-10-19 MED ORDER — SODIUM CHLORIDE 0.9 % IV SOLN: 500.0000 mL | Freq: Once | INTRAVENOUS | Status: DC

## 2021-10-19 NOTE — Progress Notes (Signed)
Pt non-responsive, VVS, Report to RN  °

## 2021-10-19 NOTE — Progress Notes (Signed)
Pt's states no medical or surgical changes since previsit or office visit. 

## 2021-10-19 NOTE — Op Note (Addendum)
[place] Patient Name: George West Procedure Date: 10/19/2021 10:28 AM MRN: [id number] Endoscopist: Sonny Masters "George West ,  Age: 61 Referring MD:  Date of Birth: December 25, 1960 Gender: Male Account #: [id number] Procedure:                Colonoscopy Indications:              Diarrhea, metastatic melanoma Medicines:                Monitored Anesthesia Care Procedure:                Pre-Anesthesia Assessment:                           - Prior to the procedure, a History and Physical                            was performed, and patient medications and                            allergies were reviewed. The patient's tolerance of                            previous anesthesia was also reviewed. The risks                            and benefits of the procedure and the sedation                            options and risks were discussed with the patient.                            All questions were answered, and informed consent                            was obtained. Prior Anticoagulants: The patient has                            taken no previous anticoagulant or antiplatelet                            agents. ASA Grade Assessment: II - A patient with                            mild systemic disease. After reviewing the risks                            and benefits, the patient was deemed in                            satisfactory condition to undergo the procedure.                           After obtaining informed consent, the colonoscope  was passed under direct vision. Throughout the                            procedure, the patient's blood pressure, pulse, and                            oxygen saturations were monitored continuously. The                            CF [id number] #[id number] was introduced through the anus                            and advanced to the the terminal ileum. The                            colonoscopy was performed  without difficulty. The                            patient tolerated the procedure well. The quality                            of the bowel preparation was good. The terminal                            ileum, ileocecal valve, appendiceal orifice, and                            rectum were photographed. Scope In: 10:32:24 AM Scope Out: 10:56:29 AM Scope Withdrawal Time: 0 hours 20 minutes 36 seconds  Total Procedure Duration: 0 hours 24 minutes 5 seconds  Findings:                 The terminal ileum appeared normal.                           A 10 mm polyp was found in the cecum. The polyp was                            sessile. The polyp was removed with a cold snare.                            Resection and retrieval were complete.                           A 2 mm polyp was found in the ascending colon. The                            polyp was sessile. The polyp was removed with a                            cold biopsy forceps. Resection and retrieval were  complete.                           There was a suspected lipoma, 12 mm in diameter, in                            the sigmoid colon. Biopsies were taken with a cold                            forceps for histology.                           Two sessile polyps were found in the sigmoid colon.                            The polyps were 4 to 10 mm in size. These polyps                            were removed with a cold snare. Resection and                            retrieval were complete.                           Non-bleeding internal hemorrhoids were found during                            retroflexion.                           Biopsies for histology were taken with a cold                            forceps from the entire colon for evaluation of                            microscopic colitis and immune mediated colitis. Complications:            No immediate complications. Estimated Blood Loss:      Estimated blood loss was minimal. Impression:               - The examined portion of the ileum was normal.                           - One 10 mm polyp in the cecum, removed with a cold                            snare. Resected and retrieved.                           - One 2 mm polyp in the ascending colon, removed                            with a cold biopsy forceps. Resected and retrieved.                           -  Lipoma in the sigmoid colon. Biopsied.                           - Two 4 to 10 mm polyps in the sigmoid colon,                            removed with a cold snare. Resected and retrieved.                           - Non-bleeding internal hemorrhoids.                           - Biopsies were taken with a cold forceps from the                            entire colon for evaluation of microscopic colitis. Recommendation:           - Discharge patient to home (with escort).                           - Recommend taking your Creon 2 capsules with meals                            and 1 capsule with snacks.                           - Await pathology results.                           - The findings and recommendations were discussed                            with the patient.                           - Return to GI clinic in 1 month. Sonny Masters "George West,  10/19/2021 11:06:45 AM

## 2021-10-19 NOTE — Progress Notes (Signed)
GASTROENTEROLOGY PROCEDURE H&P NOTE   Primary Care Physician: George Freshwater, NP    Reason for Procedure:   Diarrhea, metastatic melanoma  Plan:    Colonoscopy  Patient is appropriate for endoscopic procedure(s) in the ambulatory ([facility]) setting.  The nature of the procedure, as well as the risks, benefits, and alternatives were carefully and thoroughly reviewed with the patient. Ample time for discussion and questions allowed. The patient understood, was satisfied, and agreed to proceed.     HPI: George West is a 61 y.o. male who presents for colonoscopy for evaluation of diarrhea and metastatic melanoma .  Patient was most recently seen in the Gastroenterology Clinic on 10/16/21.  No interval change in medical history since that appointment. Please refer to that note for full details regarding GI history and clinical presentation.   Past Medical History:  Diagnosis Date   Adrenal insufficiency (HCC)    COPD (chronic obstructive pulmonary disease) (HCC)    Depression    Dyspnea    with 4f of ambulation. 200 feet if he walks slow.   Fatty liver    History of kidney stones    seen on CT Scan   Skin cancer (melanoma) (HCC)    Thyroid disease     Past Surgical History:  Procedure Laterality Date   MASS EXCISION Right 12/15/2020   Procedure: EXCISION OF MALIGNANT MASS RIGHT POSTERIOR SHOULDER;  Surgeon: BStark Klein MD;  [facility]  Service: General;  Laterality: Right;   WISDOM TOOTH EXTRACTION      Prior to Admission medications   Medication Sig Start Date End Date Taking? Authorizing Provider  Fluticasone-Umeclidin-Vilant (TRELEGY ELLIPTA) 100-62.5-25 MCG/ACT AEPB Inhale 1 puff into the lungs daily. 09/02/21   BRonnell Freshwater NP  lipase/protease/amylase (CREON) 36000 UNITS CPEP capsule Take 1 capsule (36,000 Units total) by mouth 3 (three) times daily with meals. May also take 1 capsule (36,000 Units total) as needed (with snacks). 10/01/21   KBrunetta Genera MD  loperamide (IMODIUM) 2 MG capsule Take 1 capsule (2 mg total) by mouth every 6 (six) hours as needed for diarrhea or loose stools (upto 4 doses a day). 09/04/21   KBrunetta Genera MD  predniSONE (DELTASONE) 5 MG tablet Take 1 tablet (5 mg total) by mouth daily with breakfast. 07/17/21   ERenato Shin MD  Saccharomyces boulardii 500 MG PACK Take 500 mg by mouth in the morning and at bedtime. 09/04/21   KBrunetta Genera MD  triamcinolone (KENALOG) 0.025 % cream Apply 1 application. topically 2 (two) times daily. 08/31/21   BRonnell Freshwater NP    Current Outpatient Medications  Medication Sig Dispense Refill   Fluticasone-Umeclidin-Vilant (TRELEGY ELLIPTA) 100-62.5-25 MCG/ACT AEPB Inhale 1 puff into the lungs daily. 1 each 11   lipase/protease/amylase (CREON) 36000 UNITS CPEP capsule Take 1 capsule (36,000 Units total) by mouth 3 (three) times daily with meals. May also take 1 capsule (36,000 Units total) as needed (with snacks). 90 capsule 1   loperamide (IMODIUM) 2 MG capsule Take 1 capsule (2 mg total) by mouth every 6 (six) hours as needed for diarrhea or loose stools (upto 4 doses a day). 30 capsule 0   predniSONE (DELTASONE) 5 MG tablet Take 1 tablet (5 mg total) by mouth daily with breakfast. 90 tablet 3   Saccharomyces boulardii 500 MG PACK Take 500 mg by mouth in the morning and at bedtime. 20 each 0   triamcinolone (KENALOG) 0.025 % cream Apply 1 application. topically  2 (two) times daily. 80 g 2   No current facility-administered medications for this visit.    Allergies as of 10/19/2021 - Review Complete 10/19/2021  Allergen Reaction Noted   Duloxetine  03/24/2020    Family History  Problem Relation Age of Onset   Stroke Mother    Dementia Mother    Breast cancer Sister    Thyroid cancer Sister    Ovarian cancer Sister    Stroke Maternal Grandmother    Breast cancer Paternal Grandmother    Diabetes Paternal Aunt    Diabetes Paternal Uncle     Hypertension Daughter    Other Daughter        Uterine cysts   Thyroid disease Neg Hx     Social History   Socioeconomic History   Marital status: Married    Spouse name: Not on file   Number of children: 2   Years of education: Not on file   Highest education level: Not on file  Occupational History   Occupation: Public librarian    Comment: [employer]  Tobacco Use   Smoking status: Every Day    Packs/day: 0.50    Years: 40.00    Pack years: 20.00    Types: Cigarettes    Start date: 05/17/1981   Smokeless tobacco: Never  Vaping Use   Vaping Use: Never used  Substance and Sexual Activity   Alcohol use: Yes    Alcohol/week: 6.0 standard drinks    Types: 4 Cans of beer, 2 Standard drinks or equivalent per week    Comment: 4 per day   Drug use: No   Sexual activity: Yes    Partners: Female    Birth control/protection: None  Other Topics Concern   Not on file  Social History Narrative   Married   2 children--- 1 daughter/1 son   6 step children   Social Determinants of Health   Financial Resource Strain: Not on file  Food Insecurity: Not on file  Transportation Needs: Not on file  Physical Activity: Not on file  Stress: Not on file  Social Connections: Not on file  Intimate Partner Violence: Not on file    Physical Exam: Vital signs in last 24 hours: There were no vitals taken for this visit. GEN: NAD EYE: Sclerae anicteric ENT: MMM CV: Non-tachycardic Pulm: No increased WOB GI: Soft NEURO:  Alert & Oriented   [name], MD [facility]   10/19/2021 10:00 AM

## 2021-10-19 NOTE — Progress Notes (Signed)
Called to room to assist during endoscopic procedure.  Patient ID and intended procedure confirmed with present staff. Received instructions for my participation in the procedure from the performing physician.  

## 2021-10-19 NOTE — Patient Instructions (Signed)
Handout given for polyps.  Await pathology results.  Recommend taking your Creon 2 capsules with meals and 1 capsule with snacks.   Resume previous diet and continue current medications.   YOU HAD AN ENDOSCOPIC PROCEDURE TODAY AT [facility]:   Refer to the procedure report that was given to you for any specific questions about what was found during the examination.  If the procedure report does not answer your questions, please call your gastroenterologist to clarify.  If you requested that your care partner not be given the details of your procedure findings, then the procedure report has been included in a sealed envelope for you to review at your convenience later.  YOU SHOULD EXPECT: Some feelings of bloating in the abdomen. Passage of more gas than usual.  Walking can help get rid of the air that was put into your GI tract during the procedure and reduce the bloating. If you had a lower endoscopy (such as a colonoscopy or flexible sigmoidoscopy) you may notice spotting of blood in your stool or on the toilet paper. If you underwent a bowel prep for your procedure, you may not have a normal bowel movement for a few days.  Please Note:  You might notice some irritation and congestion in your nose or some drainage.  This is from the oxygen used during your procedure.  There is no need for concern and it should clear up in a day or so.  SYMPTOMS TO REPORT IMMEDIATELY:  Following lower endoscopy (colonoscopy or flexible sigmoidoscopy):  Excessive amounts of blood in the stool  Significant tenderness or worsening of abdominal pains  Swelling of the abdomen that is new, acute  Fever of 100F or higher   For urgent or emergent issues, a gastroenterologist can be reached at any hour by calling [phone]. Do not use MyChart messaging for urgent concerns.    DIET:  We do recommend a small meal at first, but then you may proceed to your regular diet.  Drink plenty of fluids but  you should avoid alcoholic beverages for 24 hours.  ACTIVITY:  You should plan to take it easy for the rest of today and you should NOT DRIVE or use heavy machinery until tomorrow (because of the sedation medicines used during the test).    FOLLOW UP: Our staff will call the number listed on your records 24-72 hours following your procedure to check on you and address any questions or concerns that you may have regarding the information given to you following your procedure. If we do not reach you, we will leave a message.  We will attempt to reach you two times.  During this call, we will ask if you have developed any symptoms of COVID 19. If you develop any symptoms (ie: fever, flu-like symptoms, shortness of breath, cough etc.) before then, please call [phone].  If you test positive for Covid 19 in the 2 weeks post procedure, please call and report this information to [place].    If any biopsies were taken you will be contacted by phone or by letter within the next 1-3 weeks.  Please call us at [phone] if you have not heard about the biopsies in 3 weeks.    SIGNATURES/CONFIDENTIALITY: You and/or your care partner have signed paperwork which will be entered into your electronic medical record.  These signatures attest to the fact that that the information above on your After Visit Summary has been reviewed and is understood.  Full  responsibility of the confidentiality of this discharge information lies with you and/or your care-partner.  

## 2021-10-20 ENCOUNTER — Telehealth: Payer: Self-pay

## 2021-10-20 NOTE — Telephone Encounter (Signed)
  Follow up Call-     10/19/2021   10:11 AM  Call back number  Post procedure Call Back phone  # [phone] Wifes number  Permission to leave phone message Yes     Patient questions:  Do you have a fever, pain , or abdominal swelling? No. Pain Score  0 *  Have you tolerated food without any problems? Yes.    Have you been able to return to your normal activities? Yes.    Do you have any questions about your discharge instructions: Diet   No. Medications  No. Follow up visit  No.  Do you have questions or concerns about your Care? No.  Actions: * If pain score is 4 or above: No action needed, pain <4.

## 2021-10-22 ENCOUNTER — Encounter: Payer: Self-pay | Admitting: Internal Medicine

## 2021-10-23 LAB — PANCREATIC ELASTASE, FECAL: Pancreatic Elastase-1, Stool: <15 mcg/g — ABNORMAL LOW

## 2021-10-28 LAB — H. PYLORI ANTIGEN, STOOL: H pylori Ag, Stl: NEGATIVE

## 2021-10-28 LAB — CALPROTECTIN, FECAL: Calprotectin, Fecal: 28 ug/g (ref 0–120)

## 2021-10-29 ENCOUNTER — Encounter: Payer: Self-pay | Admitting: Internal Medicine

## 2021-10-29 ENCOUNTER — Ambulatory Visit (HOSPITAL_COMMUNITY)
Admission: RE | Admit: 2021-10-29 | Discharge: 2021-10-29 | Disposition: A | Discharge status: Home / Self Care | Payer: BC Managed Care – PPO | Source: Ambulatory Visit | Attending: Internal Medicine | Admitting: Internal Medicine

## 2021-10-29 DIAGNOSIS — C4359 Malignant melanoma of other part of trunk: Secondary | ICD-10-CM | POA: Diagnosis not present

## 2021-10-29 DIAGNOSIS — R197 Diarrhea, unspecified: Secondary | ICD-10-CM | POA: Diagnosis not present

## 2021-10-29 DIAGNOSIS — N200 Calculus of kidney: Secondary | ICD-10-CM | POA: Diagnosis not present

## 2021-10-29 DIAGNOSIS — K760 Fatty (change of) liver, not elsewhere classified: Secondary | ICD-10-CM | POA: Diagnosis not present

## 2021-10-29 DIAGNOSIS — R1084 Generalized abdominal pain: Secondary | ICD-10-CM

## 2021-10-29 MED ORDER — IOHEXOL 300 MG/ML  SOLN
100.0000 mL | Freq: Once | INTRAMUSCULAR | Status: AC | PRN
  Administered 2021-10-29: 100 mL via INTRAVENOUS

## 2021-11-20 ENCOUNTER — Encounter: Payer: BC Managed Care – PPO | Admitting: Gastroenterology

## 2021-12-07 ENCOUNTER — Other Ambulatory Visit: Payer: Self-pay

## 2021-12-07 ENCOUNTER — Encounter: Payer: Self-pay | Admitting: Internal Medicine

## 2021-12-09 ENCOUNTER — Encounter: Payer: Self-pay | Admitting: Hematology

## 2021-12-09 ENCOUNTER — Encounter: Payer: Self-pay | Admitting: Internal Medicine

## 2021-12-09 ENCOUNTER — Telehealth: Payer: Self-pay | Admitting: Hematology

## 2021-12-09 ENCOUNTER — Ambulatory Visit (INDEPENDENT_AMBULATORY_CARE_PROVIDER_SITE_OTHER): Payer: BC Managed Care – PPO | Admitting: Internal Medicine

## 2021-12-09 VITALS — BP 100/60 | HR 51 | Ht 71.0 in | Wt 160.0 lb

## 2021-12-09 DIAGNOSIS — R197 Diarrhea, unspecified: Secondary | ICD-10-CM | POA: Diagnosis not present

## 2021-12-09 DIAGNOSIS — K8689 Other specified diseases of pancreas: Secondary | ICD-10-CM

## 2021-12-09 MED ORDER — PANCRELIPASE (LIP-PROT-AMYL) 36000-114000 UNITS PO CPEP: ORAL_CAPSULE | ORAL | 1 refills | Status: DC

## 2021-12-09 NOTE — Progress Notes (Signed)
Chief Complaint: Pancreatic insufficiency  HPI : 61 year old male with history of metastatic melanoma, COPD, fatty liver, adrenal insufficiency, HFpEF presents for follow up of pancreatic insufficiency  Interval History: He has been having diarrhea with oily stools every 2 hours. Denies blood in stools. Has been having gas pains. He has been eating. Weight has dropped slightly by 6 lbs. He stopped his Creon 2 weeks ago because he was not able to swallow the pills. He has had longstanding trouble swallowing large pills. He did try Creon 2 capsules with each meal, and he developed constipation due to the Creon. Denies dysphagia to foods or liquids. Denies GERD. He states that he already had trouble swallowing large pills when he had his EGD 10 years ago. He has had trouble with his memory and has felt weak in general. He plans to apply for disability soon. He is drinking 2-3 beers per day still.  Wt Readings from Last 3 Encounters:  12/09/21 160 lb (72.6 kg)  10/19/21 166 lb (75.3 kg)  10/16/21 166 lb 2 oz (75.4 kg)    Current Outpatient Medications  Medication Sig Dispense Refill   Fluticasone-Umeclidin-Vilant (TRELEGY ELLIPTA) 100-62.5-25 MCG/ACT AEPB Inhale 1 puff into the lungs daily. 1 each 11   lipase/protease/amylase (CREON) 36000 UNITS CPEP capsule Take 1 capsule (36,000 Units total) by mouth 3 (three) times daily with meals. May also take 1 capsule (36,000 Units total) as needed (with snacks). 90 capsule 1   loperamide (IMODIUM) 2 MG capsule Take 1 capsule (2 mg total) by mouth every 6 (six) hours as needed for diarrhea or loose stools (upto 4 doses a day). 30 capsule 0   predniSONE (DELTASONE) 5 MG tablet Take 1 tablet (5 mg total) by mouth daily with breakfast. 90 tablet 3   Saccharomyces boulardii 500 MG PACK Take 500 mg by mouth in the morning and at bedtime. (Patient not taking: Reported on 12/09/2021) 20 each 0   triamcinolone (KENALOG) 0.025 % cream Apply 1 application. topically  2 (two) times daily. (Patient not taking: Reported on 12/09/2021) 80 g 2   No current facility-administered medications for this visit.   Review of Systems: All systems reviewed and negative except where noted in HPI.   Physical Exam: BP 100/60   Pulse (!) 51   Ht '5\' 11"'$  (1.803 m)   Wt 160 lb (72.6 kg)   BMI 22.32 kg/m  Constitutional: Pleasant,well-developed, male in no acute distress. HEENT: Normocephalic and atraumatic. Conjunctivae are normal. No scleral icterus. Cardiovascular: Normal rate, regular rhythm.  Pulmonary/chest: Effort normal and breath sounds normal. No wheezing, rales or rhonchi. Abdominal: Soft, nondistended, nontender. Bowel sounds active throughout. There are no masses palpable. No hepatomegaly. Extremities: No edema Neurological: Alert and oriented to person place and time. Skin: Skin is warm and dry. No rashes noted. Psychiatric: Normal mood and affect. Behavior is normal.  Labs 08/2021: C dif negative. GI path panel negative  Labs 09/2021: CBC nml. TSH nml. CMP nml.   Labs 10/2021: H pylori stool antigen negative. Fecal calprotectin negative. Pancreatic elastase <15 mcg/g (severe pancreatic insufficiency). Lipase nml. CRP nml. TTG IgA nml. IgA nml.   CTA PE 06/15/21: IMPRESSION: There is no evidence of pulmonary artery embolism. There is no focal pulmonary consolidation. Subtle increased interstitial markings are seen in the posterior lower lung fields suggesting scarring or interstitial pneumonitis. There is mild stranding in the fat planes adjacent to body and tail of pancreas. This may be residual scarring from previous inflammation  or suggest mild acute pancreatitis. Please correlate with laboratory findings. There are multiple foci of cortical thinning in the right kidney suggesting scarring from chronic pyelonephritis.  TTE 06/16/21: IMPRESSIONS   1. Left ventricular ejection fraction, by estimation, is 60 to 65%. The left ventricle has normal  function. The left ventricle has no regional wall motion abnormalities. Left ventricular diastolic parameters are consistent with Grade II diastolic dysfunction (pseudonormalization).   2. Right ventricular systolic function is normal. The right ventricular size is normal. There is normal pulmonary artery systolic pressure. The estimated right ventricular systolic pressure is 02.4 mmHg.   3. The mitral valve is normal in structure. Trivial mitral valve  regurgitation. No evidence of mitral stenosis.   4. The aortic valve is tricuspid. There is mild calcification of the aortic valve. Aortic valve regurgitation is not visualized. Aortic valve sclerosis is present, with no evidence of aortic valve stenosis.   5. The inferior vena cava is normal in size with greater than 50% respiratory variability, suggesting right atrial pressure of 3 mmHg.  CT A/P 10/29/21: IMPRESSION: 1. Hepatic steatosis. 2. Nonobstructive punctate left nephrolithiasis. 3. Otherwise no acute intra-abdominal or intrapelvic abnormality.  Colonoscopy 10/19/21: - The examined portion of the ileum was normal. - One 10 mm polyp in the cecum, removed with a cold snare. Resected and retrieved. - One 2 mm polyp in the ascending colon, removed with a cold biopsy forceps. Resected and retrieved. - Lipoma in the sigmoid colon. Biopsied. - Two 4 to 10 mm polyps in the sigmoid colon, removed with a cold snare. Resected and retrieved. - Non-bleeding internal hemorrhoids. - Biopsies were taken with a cold forceps from the entire colon for evaluation of microscopic colitis. Path: 1. Surgical [P], colon, cecum, ascending, sigmoid, polyp (4) - FRAGMENTS OF LOW-GRADE DYSPLASIA/TUBULAR ADENOMATOUS EPITHELIUM. - FRAGMENTS OF SESSILE SERRATED POLYP (GIVEN THE MULTIPLICITY OF POLYPS AND FRAGMENTED NATURE OF THE SPECIMEN, IT IS NOT MORPHOLOGICALLY POSSIBLE TO DISTINGUISH SEPARATE SESSILE SERRATED POLYPS AND TUBULAR ADENOMAS FROM POSSIBLE SESSILE  SERRATED POLYPS WITH DYSPLASIA). 2. Surgical [P], colon nos, random sites - COLONIC MUCOSA WITHIN NORMAL LIMITS. 3. Surgical [P], colon, sigmoid, polyp (1) - HYPERPLASTIC POLYP WITH A SMALL LYMPHOID AGGREGATE.  ASSESSMENT AND PLAN: Pancreatic insufficiency Hepatic steatosis Diarrhea Generalized ab pain History of colon polyps Patient presents for follow up diarrhea, which is suspected to be due to pancreatic insufficiency, confirmed on his recent stool studies. His other work up for diarrhea was unremarkable. Colon biopsies were negative for immune mediated colitis and microscopic colitis. 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 His last chest CT did show some fat stranding in the body and tail of the pancreas. Subsequent CT A/P did not show any signs of ongoing pancreatitis. Unfortunately it has been difficult for the patient to tolerate taking his Creon therapy because the 2 capsules per meal was causing him to have constipation. In addition, the Creon tablets are quite large so he was experiencing difficulty swallowing the large pills. If patient still has difficulty in the future, we may need to consider switching him to a different formation of pancreatic enzyme replacement.  - Restart Creon at 1.5 capsules with meals and 1 capsule with snacks. Take Creon with first bite of food. - Low fat diet - Encourage alcohol cessation - Encouraged him to make a follow up appointment with Dr. Irene Limbo to discuss restarting immunotherapy - Follow up colonoscopy due in 10/2024 for polyp surveillance -  RTC 3 months. Can try to titrate Creon in the future or can try an alternative formulation  [name], MD  I spent 46 minutes of time, including in depth chart review, independent review of results as outlined above, communicating results with the patient directly, face-to-face time with the patient, coordinating care, ordering studies and  medications as appropriate, and documentation.

## 2021-12-09 NOTE — Patient Instructions (Addendum)
If you are age 62 or older, your body mass index should be between 23-30. Your Body mass index is 22.32 kg/m. If this is out of the aforementioned range listed, please consider follow up with your Primary Care Provider.  If you are age 89 or younger, your body mass index should be between 19-25. Your Body mass index is 22.32 kg/m. If this is out of the aformentioned range listed, please consider follow up with your Primary Care Provider.   Take Creon one and a half capsule with meal and one capsule with snacks. Take with first bite of meal.  The [facility] providers would like to encourage you to use [facility] to communicate with providers for non-urgent requests or questions.  Due to long hold times on the telephone, sending your provider a message by [facility] may be a faster and more efficient way to get a response.  Please allow 48 business hours for a response.  Please remember that this is for non-urgent requests.   Thank you for entrusting me with your care and for choosing [facility], Dr. [name]

## 2021-12-09 NOTE — Telephone Encounter (Signed)
Per 7/26 phone line pt's wife called and requested an appointment.  Appointment made per pt request

## 2021-12-10 ENCOUNTER — Other Ambulatory Visit: Payer: Self-pay

## 2021-12-15 ENCOUNTER — Other Ambulatory Visit (INDEPENDENT_AMBULATORY_CARE_PROVIDER_SITE_OTHER): Payer: BC Managed Care – PPO

## 2021-12-15 ENCOUNTER — Other Ambulatory Visit: Payer: Self-pay | Admitting: Endocrinology

## 2021-12-15 DIAGNOSIS — D352 Benign neoplasm of pituitary gland: Secondary | ICD-10-CM | POA: Diagnosis not present

## 2021-12-15 LAB — TESTOSTERONE: Testosterone: 391.21 ng/dL (ref 300.00–890.00)

## 2021-12-15 LAB — BASIC METABOLIC PANEL
BUN: 10 mg/dL (ref 6–23)
CO2: 27 mEq/L (ref 19–32)
Calcium: 9.4 mg/dL (ref 8.4–10.5)
Chloride: 104 mEq/L (ref 96–112)
Creatinine, Ser: 0.84 mg/dL (ref 0.40–1.50)
GFR: 93.85 mL/min (ref >60.00)
Glucose, Bld: 82 mg/dL (ref 70–99)
Potassium: 4.7 mEq/L (ref 3.5–5.1)
Sodium: 137 mEq/L (ref 135–145)

## 2021-12-15 LAB — T4, FREE: Free T4: 1.16 ng/dL (ref 0.60–1.60)

## 2021-12-16 LAB — PROLACTIN: Prolactin: 11.4 ng/mL (ref 4.0–15.2)

## 2021-12-17 ENCOUNTER — Ambulatory Visit (INDEPENDENT_AMBULATORY_CARE_PROVIDER_SITE_OTHER): Payer: BC Managed Care – PPO | Admitting: Endocrinology

## 2021-12-17 ENCOUNTER — Encounter: Payer: Self-pay | Admitting: Endocrinology

## 2021-12-17 VITALS — BP 130/72 | HR 59 | Ht 72.0 in | Wt 166.2 lb

## 2021-12-17 DIAGNOSIS — D352 Benign neoplasm of pituitary gland: Secondary | ICD-10-CM

## 2021-12-17 DIAGNOSIS — E271 Primary adrenocortical insufficiency: Secondary | ICD-10-CM

## 2021-12-17 MED ORDER — HYDROCORTISONE 20 MG PO TABS
ORAL_TABLET | ORAL | 1 refills | Status: DC
Start: 2021-12-17 — End: 2022-06-16

## 2021-12-17 MED ORDER — FLUDROCORTISONE ACETATE 0.1 MG PO TABS: ORAL_TABLET | ORAL | 2 refills | Status: DC

## 2021-12-17 NOTE — Patient Instructions (Addendum)
Stop prednisone  Florinef 3/7 days  Check BP standing up

## 2021-12-17 NOTE — Progress Notes (Signed)
Patient ID: George West, male   DOB: 12-05-1960, 61 y.o.   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 after treatment with ipilimumab His main symptom was weight loss, tiredness, weakness and lightheadedness along with passing out starting in January 2023 This was diagnosed by his oncologist Baseline cortisol level was 1.1 and ACTH level was undetectable in 1/23 Although initially was treated with hydrocortisone and continued to have some syncope and Florinef was added He was also switched to prednisone 5 mg daily with this    He has not been seen in follow-up since 3/23 At that time he was told to hold his Florinef for 1 week since his blood pressure had gone up to high levels and he was having some headache Although he had been doing relatively well with 5 mg prednisone about 3 weeks ago he started feeling somewhat weak, lightheaded and tired and increase the prednisone to 10 mg on his own He feels generally better with this Has not checked his blood pressure at home lately but apparently with his gastroenterologist it was about 284 systolic 3 weeks ago Recent electrolytes normal  Pituitary MRI showed 4 mm nodule  Lab Results  Component Value Date   CRTSLPL 6.5 06/23/2021   CRTSLPL 0.7 06/23/2021   CRTSLPL 1.1 05/22/2021    PITUITARY functions:  At baseline he had the following labs Prolactin: borderline high.   Total testosterone 481 but free testosterone low at 1.6  THYROID:  TSH level was suppressed in 11/22 and free T4 was above normal only transiently in 1/23 at 1.82  He was given methimazole for short time but this was stopped when TSH went up higher at 11  Wt Readings from Last 3 Encounters:  12/17/21 166 lb 3.2 oz (75.4 kg)  12/09/21 160 lb (72.6 kg)  10/19/21 166 lb (75.3 kg)   Lab Results  Component Value Date   TSH 3.325  10/01/2021   TSH 3.377 09/04/2021   TSH 3.530 08/31/2021   FREET4 1.16 12/15/2021   FREET4 1.39 08/31/2021   FREET4 0.92 06/23/2021     Lab on 12/15/2021  Component Date Value Ref Range Status   Prolactin 12/15/2021 11.4  4.0 - 15.2 ng/mL Final   Testosterone 12/15/2021 391.21  300.00 - 890.00 ng/dL Final   Sodium 12/15/2021 137  135 - 145 mEq/L Final   Potassium 12/15/2021 4.7  3.5 - 5.1 mEq/L Final   Chloride 12/15/2021 104  96 - 112 mEq/L Final   CO2 12/15/2021 27  19 - 32 mEq/L Final   Glucose, Bld 12/15/2021 82  70 - 99 mg/dL Final   BUN 12/15/2021 10  6 - 23 mg/dL Final   Creatinine, Ser 12/15/2021 0.84  0.40 - 1.50 mg/dL Final   GFR 12/15/2021 93.85  >60.00 mL/min Final   Calculated using the CKD-EPI Creatinine Equation (2021)   Calcium 12/15/2021 9.4  8.4 - 10.5 mg/dL Final   Free T4 12/15/2021 1.16  0.60 - 1.60 ng/dL Final   Comment: Specimens from patients who are undergoing biotin therapy and /or ingesting biotin supplements may contain high levels of biotin.  The higher biotin concentration in these specimens interferes with this Free T4 assay.  Specimens that contain high levels  of biotin may cause false high results for this Free T4 assay.  Please interpret results in light of the total  clinical presentation of the patient.      Allergies as of 12/17/2021       Reactions   Duloxetine    dizziness        Medication List        Accurate as of December 17, 2021  9:48 AM. If you have any questions, ask your nurse or doctor.          lipase/protease/amylase 36000 UNITS Cpep capsule Commonly known as: Creon Take 1 capsule (36,000 Units total) by mouth 3 (three) times daily with meals. May also take 1 capsule (36,000 Units total) as needed (with snacks).   loperamide 2 MG capsule Commonly known as: IMODIUM Take 1 capsule (2 mg total) by mouth every 6 (six) hours as needed for diarrhea or loose stools (upto 4 doses a day).   predniSONE 5 MG tablet Commonly  known as: DELTASONE Take 1 tablet (5 mg total) by mouth daily with breakfast.   Saccharomyces boulardii 500 MG Pack Take 500 mg by mouth in the morning and at bedtime.   Trelegy Ellipta 100-62.5-25 MCG/ACT Aepb Generic drug: Fluticasone-Umeclidin-Vilant Inhale 1 puff into the lungs daily.   triamcinolone 0.025 % cream Commonly known as: KENALOG Apply 1 application. topically 2 (two) times daily.        Allergies:  Allergies  Allergen Reactions   Duloxetine     dizziness    Past Medical History:  Diagnosis Date   Adrenal insufficiency (HCC)    COPD (chronic obstructive pulmonary disease) (HCC)    Depression    Dyspnea    with 70f of ambulation. 200 feet if he walks slow.   Fatty liver    History of kidney stones    seen on CT Scan   Skin cancer (melanoma) (HCC)    Thyroid disease     Past Surgical History:  Procedure Laterality Date   MASS EXCISION Right 12/15/2020   Procedure: EXCISION OF MALIGNANT MASS RIGHT POSTERIOR SHOULDER;  Surgeon: BStark Klein MD;  [facility]  Service: General;  Laterality: Right;   WISDOM TOOTH EXTRACTION      Family History  Problem Relation Age of Onset   Stroke Mother    Dementia Mother    Breast cancer Sister    Thyroid cancer Sister    Ovarian cancer Sister    Stroke Maternal Grandmother    Breast cancer Paternal Grandmother    Diabetes Paternal Aunt    Diabetes Paternal Uncle    Hypertension Daughter    Other Daughter        Uterine cysts   Thyroid disease Neg Hx     Social History:  reports that he has been smoking cigarettes. He started smoking about 40 years ago. He has a 20.00 pack-year smoking history. He has never used smokeless tobacco. He reports current alcohol use of about 6.0 standard drinks of alcohol per week. He reports that he does not use drugs.  Review of Systems  Gastrointestinal:  Positive for diarrhea.       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  Currently taking prednisone only and apparently recently his blood pressure had been tending to be low while taking 5 mg prednisone Although he was supposed to continue fludrocortisone in March he did  not do so On 10 mg prednisone although he subjectively doing well this is more than a physiological dose and this was explained  He will go back to hydrocortisone and fludrocortisone and discussed need to replace both the corticosteroid and mineralocorticoid function of the adrenal glands  He will take 20 mg hydrocortisone in the morning and 10 mg after work Start fludrocortisone 0.1 mg 3 days a week He will check blood pressure periodically twice a week and including standing reading Unlikely that he can be tapered off steroids in the next few months  Pituitary gland function otherwise is normal and the 4 mm microadenoma is likely incidental Testosterone, free T4 and prolactin are normal now   Elayne Snare 12/17/2021, 9:48 AM

## 2021-12-18 ENCOUNTER — Other Ambulatory Visit: Payer: Self-pay

## 2022-01-22 ENCOUNTER — Encounter: Payer: Self-pay | Admitting: Endocrinology

## 2022-01-29 ENCOUNTER — Other Ambulatory Visit: Payer: Self-pay

## 2022-01-29 DIAGNOSIS — C4359 Malignant melanoma of other part of trunk: Secondary | ICD-10-CM

## 2022-02-01 ENCOUNTER — Inpatient Hospital Stay: Payer: BC Managed Care – PPO

## 2022-02-01 ENCOUNTER — Inpatient Hospital Stay: Payer: BC Managed Care – PPO | Attending: Hematology | Admitting: Hematology

## 2022-02-01 ENCOUNTER — Other Ambulatory Visit: Payer: Self-pay

## 2022-02-01 VITALS — BP 132/87 | HR 61 | Temp 98.1°F | Resp 18 | Ht 72.0 in | Wt 167.4 lb

## 2022-02-01 DIAGNOSIS — C4359 Malignant melanoma of other part of trunk: Secondary | ICD-10-CM

## 2022-02-01 DIAGNOSIS — C799 Secondary malignant neoplasm of unspecified site: Secondary | ICD-10-CM | POA: Insufficient documentation

## 2022-02-01 DIAGNOSIS — R197 Diarrhea, unspecified: Secondary | ICD-10-CM | POA: Insufficient documentation

## 2022-02-01 DIAGNOSIS — C4360 Malignant melanoma of unspecified upper limb, including shoulder: Secondary | ICD-10-CM | POA: Diagnosis not present

## 2022-02-01 DIAGNOSIS — E2740 Unspecified adrenocortical insufficiency: Secondary | ICD-10-CM | POA: Diagnosis not present

## 2022-02-01 DIAGNOSIS — F17210 Nicotine dependence, cigarettes, uncomplicated: Secondary | ICD-10-CM | POA: Insufficient documentation

## 2022-02-01 LAB — TSH: TSH: 1.512 u[IU]/mL (ref 0.350–4.500)

## 2022-02-01 LAB — CMP (CANCER CENTER ONLY)
ALT: 20 U/L (ref 0–44)
AST: 20 U/L (ref 15–41)
Albumin: 4.2 g/dL (ref 3.5–5.0)
Alkaline Phosphatase: 137 U/L — ABNORMAL HIGH (ref 38–126)
Anion gap: 9 (ref 5–15)
BUN: 15 mg/dL (ref 8–23)
CO2: 23 mmol/L (ref 22–32)
Calcium: 9.1 mg/dL (ref 8.9–10.3)
Chloride: 101 mmol/L (ref 98–111)
Creatinine: 0.96 mg/dL (ref 0.61–1.24)
GFR, Estimated: >60 mL/min (ref >60)
Glucose, Bld: 133 mg/dL — ABNORMAL HIGH (ref 70–99)
Potassium: 4.6 mmol/L (ref 3.5–5.1)
Sodium: 133 mmol/L — ABNORMAL LOW (ref 135–145)
Total Bilirubin: 1.3 mg/dL — ABNORMAL HIGH (ref 0.3–1.2)
Total Protein: 7.5 g/dL (ref 6.5–8.1)

## 2022-02-01 LAB — CBC WITH DIFFERENTIAL (CANCER CENTER ONLY)
Abs Immature Granulocytes: 0.04 10*3/uL (ref 0.00–0.07)
Basophils Absolute: 0 10*3/uL (ref 0.0–0.1)
Basophils Relative: 0 %
Eosinophils Absolute: 0 10*3/uL (ref 0.0–0.5)
Eosinophils Relative: 0 %
HCT: 47.4 % (ref 39.0–52.0)
Hemoglobin: 16.5 g/dL (ref 13.0–17.0)
Immature Granulocytes: 0 %
Lymphocytes Relative: 17 %
Lymphs Abs: 1.6 10*3/uL (ref 0.7–4.0)
MCH: 33.7 pg (ref 26.0–34.0)
MCHC: 34.8 g/dL (ref 30.0–36.0)
MCV: 96.7 fL (ref 80.0–100.0)
Monocytes Absolute: 0.4 10*3/uL (ref 0.1–1.0)
Monocytes Relative: 4 %
Neutro Abs: 7.1 10*3/uL (ref 1.7–7.7)
Neutrophils Relative %: 79 %
Platelet Count: 204 10*3/uL (ref 150–400)
RBC: 4.9 MIL/uL (ref 4.22–5.81)
RDW: 12.7 % (ref 11.5–15.5)
WBC Count: 9.2 10*3/uL (ref 4.0–10.5)
nRBC: 0 % (ref 0.0–0.2)

## 2022-02-07 NOTE — Progress Notes (Signed)
HEMATOLOGY/ONCOLOGY CLINIC NOTE  Date of Service:02/01/2022   Patient Care Team: Ronnell Freshwater, NP as PCP - General (Family Medicine) Nahser, Wonda Cheng, MD as PCP - Cardiology (Cardiology)  CHIEF COMPLAINTS/PURPOSE OF CONSULTATION:  Follow-up for continued management of metastatic melanoma  HISTORY OF PRESENTING ILLNESS:  Previous notes for details of initial presentation  INTERVAL HISTORY  [name] is here for continued evaluation and management of his metastatic melanoma. Since his last clinic visit he has been seeing Dr. Lorenso Courier for gastroenterology evaluation.  His colonoscopy and biopsies showed no evidence of immune colitis or microscopic colitis.  His diarrhea was noted to be due to pancreatic insufficiency and he has been started on pancreatic enzyme supplements with significant improvement in his diarrhea.  Patient's change endocrinologist and is now following with Dr. Elayne Snare and is currently on hydrocortisone for adrenal insufficiency.  He notes that he is off the fludrocortisone at this time due to headaches.  No other acute new focal symptoms. We discussed that we would need to reevaluate the status of his disease with repeat PET CT scan prior to discussion of restarting his immunotherapy.    MEDICAL HISTORY:  Past Medical History:  Diagnosis Date   Adrenal insufficiency (HCC)    COPD (chronic obstructive pulmonary disease) (HCC)    Depression    Dyspnea    with 32f of ambulation. 200 feet if he walks slow.   Fatty liver    History of kidney stones    seen on CT Scan   Skin cancer (melanoma) (HCC)    Thyroid disease     SURGICAL HISTORY: Past Surgical History:  Procedure Laterality Date   MASS EXCISION Right 12/15/2020   Procedure: EXCISION OF MALIGNANT MASS RIGHT POSTERIOR SHOULDER;  Surgeon: BStark Klein MD;  Location: [facility]  Service: General;  Laterality: Right;   WISDOM TOOTH EXTRACTION      SOCIAL HISTORY: Social History    Socioeconomic History   Marital status: Married    Spouse name: PMardene Celeste  Number of children: 2   Years of education: Not on file   Highest education level: Not on file  Occupational History   Occupation: PPublic librarian   Comment: [employer] Tobacco Use   Smoking status: Every Day    Packs/day: 0.50    Years: 40.00    Total pack years: 20.00    Types: Cigarettes    Start date: 05/17/1981   Smokeless tobacco: Never  Vaping Use   Vaping Use: Never used  Substance and Sexual Activity   Alcohol use: Yes    Alcohol/week: 6.0 standard drinks of alcohol    Types: 4 Cans of beer, 2 Standard drinks or equivalent per week    Comment: 4 per day   Drug use: No   Sexual activity: Yes    Partners: Female    Birth control/protection: None  Other Topics Concern   Not on file  Social History Narrative   Married   2 children--- 1 daughter/1 son   6 step children   Social Determinants of Health   Financial Resource Strain: Not on file  Food Insecurity: Not on file  Transportation Needs: Not on file  Physical Activity: Not on file  Stress: Not on file  Social Connections: Not on file  Intimate Partner Violence: Not on file    FAMILY HISTORY: Family History  Problem Relation Age of Onset   Stroke Mother    Dementia Mother  Breast cancer Sister    Thyroid cancer Sister    Ovarian cancer Sister    Stroke Maternal Grandmother    Breast cancer Paternal Grandmother    Diabetes Paternal Aunt    Diabetes Paternal Uncle    Hypertension Daughter    Other Daughter        Uterine cysts   Thyroid disease Neg Hx     ALLERGIES:  is allergic to duloxetine.  MEDICATIONS:  Current Outpatient Medications  Medication Sig Dispense Refill   fludrocortisone (FLORINEF) 0.1 MG tablet Take 1 tablet Monday/Wednesday and Friday 15 tablet 2   Fluticasone-Umeclidin-Vilant (TRELEGY ELLIPTA) 100-62.5-25 MCG/ACT AEPB Inhale 1 puff into the lungs daily. 1 each 11    hydrocortisone (CORTEF) 20 MG tablet 1 tablet in the morning and half tablet at 5 PM 135 tablet 1   lipase/protease/amylase (CREON) 36000 UNITS CPEP capsule Take 1 capsule (36,000 Units total) by mouth 3 (three) times daily with meals. May also take 1 capsule (36,000 Units total) as needed (with snacks). 90 capsule 1   loperamide (IMODIUM) 2 MG capsule Take 1 capsule (2 mg total) by mouth every 6 (six) hours as needed for diarrhea or loose stools (upto 4 doses a day). 30 capsule 0   Saccharomyces boulardii 500 MG PACK Take 500 mg by mouth in the morning and at bedtime. (Patient not taking: Reported on 12/09/2021) 20 each 0   triamcinolone (KENALOG) 0.025 % cream Apply 1 application. topically 2 (two) times daily. (Patient not taking: Reported on 12/09/2021) 80 g 2   No current facility-administered medications for this visit.    REVIEW OF SYSTEMS:   10 Point review of Systems was done is negative except as noted above.  PHYSICAL EXAMINATION: ECOG PERFORMANCE STATUS: 1 - Symptomatic but completely ambulatory  .BP 132/87 (BP Location: Left Arm, Patient Position: Sitting)   Pulse 61   Temp 98.1 F (36.7 C) (Tympanic)   Resp 18   Ht 6' (1.829 m)   Wt 167 lb 6.4 oz (75.9 kg)   SpO2 95%   BMI 22.70 kg/m  NAD GENERAL:alert, in no acute distress and comfortable SKIN: no acute rashes, no significant lesions EYES: conjunctiva are pink and non-injected, sclera anicteric OROPHARYNX: MMM, no exudates, no oropharyngeal erythema or ulceration NECK: supple, no JVD LYMPH:  no palpable lymphadenopathy in the cervical, axillary or inguinal regions LUNGS: clear to auscultation b/l with normal respiratory effort HEART: regular rate & rhythm ABDOMEN:  normoactive bowel sounds , non tender, not distended. Extremity: no pedal edema PSYCH: alert & oriented x 3 with fluent speech NEURO: no focal motor/sensory deficits   LABORATORY DATA:  I have reviewed the data as listed  .    Latest Ref Rng &  Units 02/01/2022   12:53 PM 10/06/2021    8:34 AM 10/01/2021   10:01 AM  CBC  WBC 4.0 - 10.5 K/uL 9.2  8.4  7.7   Hemoglobin 13.0 - 17.0 g/dL 16.5  15.8  15.9   Hematocrit 39.0 - 52.0 % 47.4  45.4  46.9   Platelets 150 - 400 K/uL 204  220  190    .    Latest Ref Rng & Units 02/01/2022   12:53 PM 12/15/2021    8:51 AM 10/01/2021   10:01 AM  CMP  Glucose 70 - 99 mg/dL 133  82  104   BUN 8 - 23 mg/dL 15  10  10    Creatinine 0.61 - 1.24 mg/dL 0.96  0.84  0.84  Sodium 135 - 145 mmol/L 133  137  139   Potassium 3.5 - 5.1 mmol/L 4.6  4.7  4.1   Chloride 98 - 111 mmol/L 101  104  107   CO2 22 - 32 mmol/L 23  27  24    Calcium 8.9 - 10.3 mg/dL 9.1  9.4  9.3   Total Protein 6.5 - 8.1 g/dL 7.5   8.0   Total Bilirubin 0.3 - 1.2 mg/dL 1.3   0.6   Alkaline Phos 38 - 126 U/L 137   124   AST 15 - 41 U/L 20   21   ALT 0 - 44 U/L 20   21    TSH 08/31/2021 --- 3.53.  Free T4   1.39  SURGICAL PATHOLOGY  CASE: [id number]  PATIENT: George West  Surgical Pathology Report   Clinical History: right posterior shoulder soft tissue mass (cm)   FINAL MICROSCOPIC DIAGNOSIS:   A. SOFT TISSUE MASS, RIGHT POSTERIOR SHOULDER, NEEDLE CORE BIOPSY:  -  Malignant melanoma  -  See comment   COMMENT:   By immunohistochemistry, the neoplastic cells are positive for S100,  HMB45, Sox 10 and Melan-A (patchy) but negative for CD45, CD138,  cytokeratin 7, cytokeratin 20, cytokeratin AE1/3, desmin and EMA.  The  morphology and immunophenotype are consistent with malignant melanoma.  Dr. Irene Limbo was notified of these results on November 24, 2020.  Dr. Saralyn Pilar  reviewed the case and agrees with the above diagnosis.    RADIOGRAPHIC STUDIES: I have personally reviewed the radiological images as listed and agreed with the findings in the report. No results found.   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  Unclear primary site with the patient presented with a metastatic deposit in the soft tissue/skin over the right posterior shoulder. Patient also has multiple slightly hypermetabolic lymph nodes-right axillary, subpectoral and right cervical.  These could be from metastatic melanoma or a possible indolent lymphoma related to his IgM kappa monoclonal paraproteinemia. 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  GI panel and C. difficile testing negative.  Colonoscopy with biopsy rule out any colitis or microscopic colitis.  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 Lorenso Courier from GI.  PLAN Patient's chart and gastroenterology and endocrinology notes reviewed and discussed with him. Labs done today were discussed in detail. Patient's diarrhea has significantly improved with pancreatic enzyme supplements and imaging colitis has been ruled out. We will reassess the status of his melanoma with a repeat PET CT scan and repeat MRI of the brain. We will discuss with him consideration of restarting his PD-1 inhibitor. -He will continue following with endocrinology and gastroenterology to manage his other medical issues. -He continues to smoke half a pack of cigarettes daily.   FOLLOW UP MRI brain in 3-5 days PET/CT in 3-5 days Phone visit in 10-12 days  The total time spent in the appointment was 30 minutes*.  All of the patient's questions were answered with apparent satisfaction. The patient knows to call the clinic with any problems, questions or concerns.   Sullivan Lone MD [place] AAHIVMS [facility] Hematology/Oncology Physician [place]  [place]  .*Total Encounter Time as defined by the Centers for Medicare and Medicaid Services includes, in addition to the face-to-face time of a patient visit (documented in the note above) non-face-to-face time: obtaining and reviewing outside history, ordering and reviewing medications, tests or procedures, care coordination (communications with other health care professionals or caregivers) and documentation in the medical record.

## 2022-02-08 ENCOUNTER — Encounter: Payer: Self-pay | Admitting: Hematology

## 2022-02-12 ENCOUNTER — Other Ambulatory Visit: Payer: Self-pay

## 2022-02-12 ENCOUNTER — Encounter: Payer: Self-pay | Admitting: Nurse Practitioner

## 2022-02-12 DIAGNOSIS — J449 Chronic obstructive pulmonary disease, unspecified: Secondary | ICD-10-CM

## 2022-02-12 MED ORDER — TRELEGY ELLIPTA 100-62.5-25 MCG/ACT IN AEPB: 1.0000 | INHALATION_SPRAY | Freq: Every day | RESPIRATORY_TRACT | 11 refills | Status: DC

## 2022-02-12 NOTE — Telephone Encounter (Signed)
Rx was sent to pharmacy. 

## 2022-02-22 ENCOUNTER — Ambulatory Visit (HOSPITAL_COMMUNITY)
Admission: RE | Admit: 2022-02-22 | Discharge: 2022-02-22 | Disposition: A | Discharge status: Home / Self Care | Payer: BC Managed Care – PPO | Source: Ambulatory Visit | Attending: Hematology | Admitting: Hematology

## 2022-02-22 DIAGNOSIS — C9000 Multiple myeloma not having achieved remission: Secondary | ICD-10-CM | POA: Diagnosis not present

## 2022-02-22 DIAGNOSIS — C4359 Malignant melanoma of other part of trunk: Secondary | ICD-10-CM | POA: Diagnosis not present

## 2022-02-22 DIAGNOSIS — C439 Malignant melanoma of skin, unspecified: Secondary | ICD-10-CM | POA: Diagnosis not present

## 2022-02-22 DIAGNOSIS — E237 Disorder of pituitary gland, unspecified: Secondary | ICD-10-CM | POA: Diagnosis not present

## 2022-02-22 LAB — GLUCOSE, CAPILLARY: Glucose-Capillary: 97 mg/dL (ref 70–99)

## 2022-02-22 MED ORDER — FLUDEOXYGLUCOSE F - 18 (FDG) INJECTION
8.3000 | Freq: Once | INTRAVENOUS | Status: AC
  Administered 2022-02-22: 8.3 via INTRAVENOUS

## 2022-02-22 MED ORDER — GADOPICLENOL 0.5 MMOL/ML IV SOLN
7.5000 mL | Freq: Once | INTRAVENOUS | Status: AC | PRN
  Administered 2022-02-22: 7.5 mL via INTRAVENOUS

## 2022-02-24 ENCOUNTER — Inpatient Hospital Stay: Payer: BC Managed Care – PPO | Attending: Hematology | Admitting: Hematology

## 2022-02-24 DIAGNOSIS — C4359 Malignant melanoma of other part of trunk: Secondary | ICD-10-CM

## 2022-02-24 NOTE — Progress Notes (Signed)
HEMATOLOGY/ONCOLOGY CLINIC NOTE Phone visit.  Date of Service: 02/24/22    Patient Care Team: Ronnell Freshwater, NP as PCP - General (Family Medicine) Nahser, Wonda Cheng, MD as PCP - Cardiology (Cardiology)  CHIEF COMPLAINTS/PURPOSE OF CONSULTATION:  Follow-up for continued management of metastatic melanoma  HISTORY OF PRESENTING ILLNESS:  Previous notes for details of initial presentation  INTERVAL HISTORY  ..I connected with George West on 02/24/2022 at  3:30 PM EDT by telephone visit and verified that I am speaking with the correct person using two identifiers.   I discussed the limitations, risks, security and privacy concerns of performing an evaluation and management service by telemedicine and the availability of in-person appointments. I also discussed with the patient that there may be a patient responsible charge related to this service. The patient expressed understanding and agreed to proceed.   Other persons participating in the visit and their role in the encounter: patients wife   Patient's location: home  Provider's location: [facility]   Chief Complaint: f/u for metastatic melanoma   George West is here for continued evaluation and management of his metastatic melanoma.  He was last seen by me on 02/01/2022 and was doing well.   I talked to her wife and explained the patients Pet scan and Brain MRI results. Patient was not present, but wife mentions he has been doing well. MRI showed no melanoma in the brain. However, there is microadenoma in his left pituitary but are not concerning. There is small cyst on his left neck and lymph nodes are small.Overall, his scans showed no clear evidence of residual melanoma at this time.  Her wife asked if he can get off of his treatment, which was explained that this will be a personal choice. His wife wants him finish the treatment, but the patient wants to stop treatment because his scans were normal.and since he  does not want the treatment to affect his QOL. So, we will hold off on his treatment.    MEDICAL HISTORY:  Past Medical History:  Diagnosis Date   Adrenal insufficiency (HCC)    COPD (chronic obstructive pulmonary disease) (HCC)    Depression    Dyspnea    with 77f of ambulation. 200 feet if he walks slow.   Fatty liver    History of kidney stones    seen on CT Scan   Skin cancer (melanoma) (HCC)    Thyroid disease     SURGICAL HISTORY: Past Surgical History:  Procedure Laterality Date   MASS EXCISION Right 12/15/2020   Procedure: EXCISION OF MALIGNANT MASS RIGHT POSTERIOR SHOULDER;  Surgeon: BStark Klein MD;  [place] [facility] [place]  Service: General;  Laterality: Right;   WISDOM TOOTH EXTRACTION      SOCIAL HISTORY: Social History   Socioeconomic History   Marital status: Married    Spouse name: PMardene Celeste  Number of children: 2   Years of education: Not on file   Highest education level: Not on file  Occupational History   Occupation: PPublic librarian   Comment: [employer] Tobacco Use   Smoking status: Every Day    Packs/day: 0.50    Years: 40.00    Total pack years: 20.00    Types: Cigarettes    Start date: 05/17/1981   Smokeless tobacco: Never  Vaping Use   Vaping Use: Never used  Substance and Sexual Activity   Alcohol use: Yes    Alcohol/week: 6.0 standard drinks of alcohol  Types: 4 Cans of beer, 2 Standard drinks or equivalent per week    Comment: 4 per day   Drug use: No   Sexual activity: Yes    Partners: Female    Birth control/protection: None  Other Topics Concern   Not on file  Social History Narrative   Married   2 children--- 1 daughter/1 son   6 step children   Social Determinants of Health   Financial Resource Strain: Not on file  Food Insecurity: Not on file  Transportation Needs: Not on file  Physical Activity: Not on file  Stress: Not on file  Social Connections: Not on file  Intimate Partner Violence: Not on  file    FAMILY HISTORY: Family History  Problem Relation Age of Onset   Stroke Mother    Dementia Mother    Breast cancer Sister    Thyroid cancer Sister    Ovarian cancer Sister    Stroke Maternal Grandmother    Breast cancer Paternal Grandmother    Diabetes Paternal Aunt    Diabetes Paternal Uncle    Hypertension Daughter    Other Daughter        Uterine cysts   Thyroid disease Neg Hx     ALLERGIES:  is allergic to duloxetine.  MEDICATIONS:  Current Outpatient Medications  Medication Sig Dispense Refill   fludrocortisone (FLORINEF) 0.1 MG tablet Take 1 tablet Monday/Wednesday and Friday 15 tablet 2   Fluticasone-Umeclidin-Vilant (TRELEGY ELLIPTA) 100-62.5-25 MCG/ACT AEPB Inhale 1 puff into the lungs daily. 1 each 11   hydrocortisone (CORTEF) 20 MG tablet 1 tablet in the morning and half tablet at 5 PM 135 tablet 1   lipase/protease/amylase (CREON) 36000 UNITS CPEP capsule Take 1 capsule (36,000 Units total) by mouth 3 (three) times daily with meals. May also take 1 capsule (36,000 Units total) as needed (with snacks). 90 capsule 1   loperamide (IMODIUM) 2 MG capsule Take 1 capsule (2 mg total) by mouth every 6 (six) hours as needed for diarrhea or loose stools (upto 4 doses a day). 30 capsule 0   Saccharomyces boulardii 500 MG PACK Take 500 mg by mouth in the morning and at bedtime. (Patient not taking: Reported on 12/09/2021) 20 each 0   triamcinolone (KENALOG) 0.025 % cream Apply 1 application. topically 2 (two) times daily. (Patient not taking: Reported on 12/09/2021) 80 g 2   No current facility-administered medications for this visit.    REVIEW OF SYSTEMS:   .10 Point review of Systems was done is negative except as noted above.  PHYSICAL EXAMINATION: Telemedicine visit  LABORATORY DATA:  I have reviewed the data as listed  .    Latest Ref Rng & Units 02/01/2022   12:53 PM 10/06/2021    8:34 AM 10/01/2021   10:01 AM  CBC  WBC 4.0 - 10.5 K/uL 9.2  8.4  7.7    Hemoglobin 13.0 - 17.0 g/dL 16.5  15.8  15.9   Hematocrit 39.0 - 52.0 % 47.4  45.4  46.9   Platelets 150 - 400 K/uL 204  220  190    .    Latest Ref Rng & Units 02/25/2022    8:18 AM 02/01/2022   12:53 PM 12/15/2021    8:51 AM  CMP  Glucose 70 - 99 mg/dL 120  133  82   BUN 6 - 23 mg/dL 9  15  10    Creatinine 0.40 - 1.50 mg/dL 0.89  0.96  0.84   Sodium 135 -  145 mEq/L 140  133  137   Potassium 3.5 - 5.1 mEq/L 4.3  4.6  4.7   Chloride 96 - 112 mEq/L 106  101  104   CO2 19 - 32 mEq/L 26  23  27    Calcium 8.4 - 10.5 mg/dL 9.1  9.1  9.4   Total Protein 6.5 - 8.1 g/dL  7.5    Total Bilirubin 0.3 - 1.2 mg/dL  1.3    Alkaline Phos 38 - 126 U/L  137    AST 15 - 41 U/L  20    ALT 0 - 44 U/L  20     . Lab Results  Component Value Date   LDH 144 06/16/2020    SURGICAL PATHOLOGY  CASE: [id number]  PATIENT: [facility]  Surgical Pathology Report   Clinical History: right posterior shoulder soft tissue mass (cm)   FINAL MICROSCOPIC DIAGNOSIS:   A. SOFT TISSUE MASS, RIGHT POSTERIOR SHOULDER, NEEDLE CORE BIOPSY:  -  Malignant melanoma  -  See comment   COMMENT:   By immunohistochemistry, the neoplastic cells are positive for S100,  HMB45, Sox 10 and Melan-A (patchy) but negative for CD45, CD138,  cytokeratin 7, cytokeratin 20, cytokeratin AE1/3, desmin and EMA.  The  morphology and immunophenotype are consistent with malignant melanoma.  Dr. Irene Limbo was notified of these results on November 24, 2020.  Dr. Saralyn Pilar  reviewed the case and agrees with the above diagnosis.    RADIOGRAPHIC STUDIES: I have personally reviewed the radiological images as listed and agreed with the findings in the report. NM PET Image Restage (PS) Whole Body  Result Date: 02/23/2022 CLINICAL DATA:  Subsequent treatment strategy for melanoma. EXAM: NUCLEAR MEDICINE PET WHOLE BODY TECHNIQUE: 8.37 mCi F-18 FDG was injected intravenously. Full-ring PET imaging was performed from the head to foot after the  radiotracer. CT data was obtained and used for attenuation correction and anatomic localization. Fasting blood glucose: 97 mg/dl COMPARISON:  Multiple priors including CT abdomen pelvis October 29, 2021, chest CT June 15, 2021, and PET-CT April 16, 2021 FINDINGS: Mediastinal blood pool activity: SUV max 1.75 HEAD/NECK: No hypermetabolic cervical adenopathy. Incidental CT findings: Stable non hypermetabolic well-circumscribed fluid density 24 x 16 mm lesion deep to the right sternocleidomastoid and another lower in the neck the right measuring 3.5 x 2.3 cm are stable from prior. CHEST: Decreased FDG avidity in the hypermetabolic soft tissue thickening/stranding in the subcutaneous soft tissues posterior to the right scapula on image 91/4 with a max SUV of 4.1 previously 5.2 corresponding with the previously excised primary melanoma. Similar size of the right axillary and subpectoral lymph nodes previously indexed right axillary lymph node measures 6 mm on short axis on image 99/4 previously 7 mm. Minimally metabolic mediastinal lymph nodes are similar prior for instance a high right paratracheal lymph node measuring 6 mm in short axis on image 88/4 with a max SUV of 1.8 and a low right paratracheal lymph node measuring 5 mm in short axis on image 103/4 with a max SUV of 2.3. Incidental CT findings: Similar size of the angular 5 mm right lower lobe pulmonary nodule on image 46/7 previously measuring 6 mm. Motion degraded examination of the lungs reveals no new suspicious pulmonary nodules or masses. ABDOMEN/PELVIS: No abnormal hypermetabolic activity within the liver, pancreas, adrenal glands, or spleen. No hypermetabolic lymph nodes in the abdomen or pelvis. Incidental CT findings: Bilateral adrenal glands appear normal. Similar right cortical renal scarring. Punctate nonobstructive left lower pole  renal stones. SKELETON: No focal hypermetabolic activity to suggest skeletal metastasis. Incidental CT findings: none  EXTREMITIES: No abnormal hypermetabolic activity in the lower extremities. Incidental CT findings: none IMPRESSION: 1. Decreased moderate FDG avid soft tissue thickening/stranding in the subcutaneous soft tissues posterior to the right scapula corresponding with the previously excised primary melanoma, nonspecific but with the overall stability and decreased metabolic activity suggestive postsurgical change. Suggest correlation with direct visualization and continued attention on follow-up imaging. 2. No evidence of new or progressive hypermetabolic metastatic disease in the chest, abdomen or pelvis. Electronically Signed   By: Dahlia Bailiff M.D.   On: 02/23/2022 13:52   George Brain W Wo Contrast  Result Date: 02/23/2022 CLINICAL DATA:  Melanoma, assess treatment response. EXAM: MRI HEAD WITHOUT AND WITH CONTRAST TECHNIQUE: Multiplanar, multiecho pulse sequences of the brain and surrounding structures were obtained without and with intravenous contrast. CONTRAST:  7.5 mL Vueway COMPARISON:  George head without and with contrast 05/14/2021 FINDINGS: Brain: No acute infarct, hemorrhage, or mass lesion is present. Stable focus of T2 hyperintensity within the right parietal white matter. No other significant white matter disease is present. The ventricles are of normal size. Deep brain nuclei are within normal limits. No significant extraaxial fluid collection is present. The internal auditory canals are within normal limits. The brainstem and cerebellum are within normal limits. Postcontrast images demonstrate no pathologic enhancement. Coronal images again demonstrate a hypoenhancing area on the left side of the pituitary, unchanged. This is best seen on image 21 of series 17 and image 15 of series 18. Vascular: Flow is present in the major intracranial arteries. Skull and upper cervical spine: The paranasal sinuses and mastoid air cells are clear. The globes and orbits are within normal limits. Sinuses/Orbits: The  paranasal sinuses and mastoid air cells are clear. The globes and orbits are within normal limits. IMPRESSION: 1. No evidence for metastatic disease to the brain. 2. No acute intracranial abnormality. 3. Stable hypoenhancing area on the left side of the pituitary. This may represent a pituitary microadenoma. Electronically Signed   By: San Morelle M.D.   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  Unclear primary site with the patient presented with a metastatic deposit in the soft tissue/skin over the right posterior shoulder. Patient also has multiple slightly hypermetabolic lymph nodes-right axillary, subpectoral and right cervical.  These could be from metastatic melanoma or a possible indolent lymphoma related to his IgM kappa monoclonal paraproteinemia. 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  GI panel and C. difficile testing negative.  Colonoscopy with biopsy rule out any colitis or microscopic colitis.  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 Lorenso Courier from GI.   PLAN -Discussed his PET scan and brain MRI with his wife over the phone. -Will hold off on treatment from today and monitor his melanoma with active surveillance over time. -I discussed that we woul dtypically want his melanoma to be stable/NED for 2 years prior to holding immunotherapy but patient prefers to hold the treatment now   FOLLOW UP RTC with Dr Irene Limbo with labs in  4 months  The total time spent in the appointment was 15  minutes* .  All of the patient's questions were answered with apparent satisfaction. The patient knows to call the clinic with any problems, questions or concerns.   I,Param Shah,acting as a Education administrator for Sullivan Lone, MD.,have documented all relevant documentation on the behalf of Sullivan Lone, MD,as directed by  Sullivan Lone, MD while in the presence of Sullivan Lone, MD.   Sullivan Lone MD [place] AAHIVMS [facility] Hematology/Oncology Physician [facility]  .*Total Encounter Time as defined by the Centers for Medicare and Medicaid Services includes, in addition to the face-to-face time of a patient visit (documented in the note above) non-face-to-face time: obtaining and reviewing outside history, ordering and reviewing medications, tests or procedures, care coordination (communications with other health care professionals or caregivers) and documentation in the medical record.

## 2022-02-25 ENCOUNTER — Other Ambulatory Visit (INDEPENDENT_AMBULATORY_CARE_PROVIDER_SITE_OTHER): Payer: BC Managed Care – PPO

## 2022-02-25 DIAGNOSIS — E271 Primary adrenocortical insufficiency: Secondary | ICD-10-CM | POA: Diagnosis not present

## 2022-02-25 LAB — BASIC METABOLIC PANEL
BUN: 9 mg/dL (ref 6–23)
CO2: 26 mEq/L (ref 19–32)
Calcium: 9.1 mg/dL (ref 8.4–10.5)
Chloride: 106 mEq/L (ref 96–112)
Creatinine, Ser: 0.89 mg/dL (ref 0.40–1.50)
GFR: 92.09 mL/min (ref >60.00)
Glucose, Bld: 120 mg/dL — ABNORMAL HIGH (ref 70–99)
Potassium: 4.3 mEq/L (ref 3.5–5.1)
Sodium: 140 mEq/L (ref 135–145)

## 2022-03-02 ENCOUNTER — Encounter: Payer: Self-pay | Admitting: Hematology

## 2022-03-03 ENCOUNTER — Encounter: Payer: Self-pay | Admitting: Hematology

## 2022-03-04 ENCOUNTER — Encounter: Payer: Self-pay | Admitting: Endocrinology

## 2022-03-04 ENCOUNTER — Ambulatory Visit (INDEPENDENT_AMBULATORY_CARE_PROVIDER_SITE_OTHER): Payer: BC Managed Care – PPO | Admitting: Endocrinology

## 2022-03-04 VITALS — BP 128/78 | HR 63 | Ht 72.0 in | Wt 173.2 lb

## 2022-03-04 DIAGNOSIS — E271 Primary adrenocortical insufficiency: Secondary | ICD-10-CM

## 2022-03-04 DIAGNOSIS — R7301 Impaired fasting glucose: Secondary | ICD-10-CM | POA: Diagnosis not present

## 2022-03-04 LAB — POCT GLYCOSYLATED HEMOGLOBIN (HGB A1C): Hemoglobin A1C: 5.7 % — AB (ref 4.0–5.6)

## 2022-03-04 NOTE — Progress Notes (Signed)
Patient ID: George West, male   DOB: 14-Nov-1960, 61 y.o.   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 after treatment with ipilimumab His main symptom was weight loss, tiredness, weakness and lightheadedness along with passing out starting in January 2023 This was diagnosed by his oncologist Baseline cortisol level was 1.1 and ACTH level was undetectable in 1/23 Although initially was treated with hydrocortisone and continued to have some syncope and Florinef was added He was also switched to prednisone 5 mg daily instead of hydrocortisone   Subsequently Florinef had been stopped because of high blood pressure and headache  However about 3 weeks prior to his follow-up in 8/23 he started feeling somewhat weak, lightheaded and tired and increase the prednisone to 10 mg on his own with some improvement Because of low normal standing blood pressures in 8/23 he was started on Florinef 3 days a week and switched back to hydrocortisone 20 mg in the morning and 10 mg in the evening  Has however being off the Florinef again because of rising blood pressure up to 164 and symptoms of throbbing in the head and eye pain this was stopped again  Recent blood pressure readings have been normal at home subsequently  Home he recently 134/78  Overall since switching back to hydrocortisone he has felt little more energetic although he still thinks he feels tired He has gained about 6 pounds He thinks he is having bruises without significant trauma on his arms  Pituitary MRI showed 4 mm nodule  Lab Results  Component Value Date   CRTSLPL 6.5 06/23/2021   CRTSLPL 0.7 06/23/2021   CRTSLPL 1.1 05/22/2021    PITUITARY functions:  At baseline he had the following labs Prolactin: borderline high.   Total testosterone 481 but free testosterone low at 1.6  THYROID:  TSH  level was suppressed in 11/22 and free T4 was above normal only transiently in 1/23 at 1.82  He was given methimazole for short time but this was stopped when TSH went up higher at 11  Wt Readings from Last 3 Encounters:  03/04/22 173 lb 3.2 oz (78.6 kg)  02/01/22 167 lb 6.4 oz (75.9 kg)  12/17/21 166 lb 3.2 oz (75.4 kg)   Lab Results  Component Value Date   TSH 1.512 02/01/2022   TSH 3.325 10/01/2021   TSH 3.377 09/04/2021   FREET4 1.16 12/15/2021   FREET4 1.39 08/31/2021   FREET4 0.92 06/23/2021     No visits with results within 1 Week(s) from this visit.  Latest known visit with results is:  Lab on 02/25/2022  Component Date Value Ref Range Status   Sodium 02/25/2022 140  135 - 145 mEq/L Final   Potassium 02/25/2022 4.3  3.5 - 5.1 mEq/L Final   Chloride 02/25/2022 106  96 - 112 mEq/L Final   CO2 02/25/2022 26  19 - 32 mEq/L Final   Glucose, Bld 02/25/2022 120 (H)  70 - 99 mg/dL Final   BUN 02/25/2022 9  6 - 23 mg/dL Final   Creatinine, Ser 02/25/2022 0.89  0.40 - 1.50 mg/dL Final   GFR 02/25/2022 92.09  >60.00 mL/min Final   Calculated using the CKD-EPI Creatinine Equation (2021)   Calcium 02/25/2022 9.1  8.4 - 10.5 mg/dL Final    Allergies as of 03/04/2022       Reactions  Duloxetine    dizziness        Medication List        Accurate as of March 04, 2022 10:01 AM. If you have any questions, ask your nurse or doctor.          fludrocortisone 0.1 MG tablet Commonly known as: FLORINEF Take 1 tablet Monday/Wednesday and Friday   hydrocortisone 20 MG tablet Commonly known as: CORTEF 1 tablet in the morning and half tablet at 5 PM   lipase/protease/amylase 36000 UNITS Cpep capsule Commonly known as: Creon Take 1 capsule (36,000 Units total) by mouth 3 (three) times daily with meals. May also take 1 capsule (36,000 Units total) as needed (with snacks).   loperamide 2 MG capsule Commonly known as: IMODIUM Take 1 capsule (2 mg total) by mouth every  6 (six) hours as needed for diarrhea or loose stools (upto 4 doses a day).   Saccharomyces boulardii 500 MG Pack Take 500 mg by mouth in the morning and at bedtime.   Trelegy Ellipta 100-62.5-25 MCG/ACT Aepb Generic drug: Fluticasone-Umeclidin-Vilant Inhale 1 puff into the lungs daily.   triamcinolone 0.025 % cream Commonly known as: KENALOG Apply 1 application. topically 2 (two) times daily.        Allergies:  Allergies  Allergen Reactions   Duloxetine     dizziness    Past Medical History:  Diagnosis Date   Adrenal insufficiency (HCC)    COPD (chronic obstructive pulmonary disease) (HCC)    Depression    Dyspnea    with 36f of ambulation. 200 feet if he walks slow.   Fatty liver    History of kidney stones    seen on CT Scan   Skin cancer (melanoma) (HCC)    Thyroid disease     Past Surgical History:  Procedure Laterality Date   MASS EXCISION Right 12/15/2020   Procedure: EXCISION OF MALIGNANT MASS RIGHT POSTERIOR SHOULDER;  Surgeon: BStark Klein MD;  [place] [facility]  Service: General;  Laterality: Right;   WISDOM TOOTH EXTRACTION      Family History  Problem Relation Age of Onset   Stroke Mother    Dementia Mother    Breast cancer Sister    Thyroid cancer Sister    Ovarian cancer Sister    Stroke Maternal Grandmother    Breast cancer Paternal Grandmother    Diabetes Paternal Aunt    Diabetes Paternal Uncle    Hypertension Daughter    Other Daughter        Uterine cysts   Thyroid disease Neg Hx     Social History:  reports that he has been smoking cigarettes. He started smoking about 40 years ago. He has a 20.00 pack-year smoking history. He has never used smokeless tobacco. He reports current alcohol use of about 6.0 standard drinks of alcohol per week. He reports that he does not use drugs.  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  Currently taking hydrocortisone only and subjectively doing fairly well with a total of 30 mg a day in divided doses However he is tending to gain weight, having a higher fasting glucose and some bruising on his arms despite not having cushingoid appearance  His blood  pressure is maintained in the normal range without orthostatic changes without Florinef now  We will see if he is able to get by with 10 mg twice daily of hydrocortisone and reduce the dose in the morning to half tablet also He will let us know if he starts having any symptoms of fatigue or weakness Continue to check blood pressure regularly  Impaired fasting glucose of 120: Currently A1c is upper normal at 5.7 May improved with reduced hydrocortisone Will periodically will check glucose and A1c  TSH levels normal  Jamea Robicheaux 03/04/2022, 10:01 AM

## 2022-03-04 NOTE — Patient Instructions (Addendum)
Try 1/2 and 1/2 on Hydrocortisone

## 2022-03-05 ENCOUNTER — Other Ambulatory Visit: Payer: Self-pay

## 2022-03-12 ENCOUNTER — Telehealth: Payer: Self-pay

## 2022-03-12 NOTE — Telephone Encounter (Signed)
Contacted pt's wife regarding MyChart message. Discussed options for starting tx since Dr Grier Mitts understanding was pt wanted to wait until something changes before he starts tx. Per Dr Irene Limbo he would prefer pt if /when he decides to do tx that it would be for 2 years. Wife states per pt he is ok to wait . Pt to call if any issues arise.

## 2022-06-16 ENCOUNTER — Other Ambulatory Visit: Payer: Self-pay | Admitting: Endocrinology

## 2022-06-28 ENCOUNTER — Other Ambulatory Visit: Payer: Self-pay

## 2022-06-28 DIAGNOSIS — C4359 Malignant melanoma of other part of trunk: Secondary | ICD-10-CM

## 2022-06-29 ENCOUNTER — Inpatient Hospital Stay: Payer: BC Managed Care – PPO | Attending: Hematology

## 2022-06-29 ENCOUNTER — Inpatient Hospital Stay (HOSPITAL_BASED_OUTPATIENT_CLINIC_OR_DEPARTMENT_OTHER): Payer: BC Managed Care – PPO | Admitting: Hematology

## 2022-06-29 VITALS — BP 128/90 | HR 65 | Temp 97.9°F | Resp 20 | Wt 179.8 lb

## 2022-06-29 DIAGNOSIS — C799 Secondary malignant neoplasm of unspecified site: Secondary | ICD-10-CM | POA: Diagnosis not present

## 2022-06-29 DIAGNOSIS — D472 Monoclonal gammopathy: Secondary | ICD-10-CM | POA: Insufficient documentation

## 2022-06-29 DIAGNOSIS — Z79620 Long term (current) use of immunosuppressive biologic: Secondary | ICD-10-CM | POA: Insufficient documentation

## 2022-06-29 DIAGNOSIS — C4360 Malignant melanoma of unspecified upper limb, including shoulder: Secondary | ICD-10-CM | POA: Diagnosis not present

## 2022-06-29 DIAGNOSIS — C4359 Malignant melanoma of other part of trunk: Secondary | ICD-10-CM | POA: Diagnosis not present

## 2022-06-29 LAB — CBC WITH DIFFERENTIAL (CANCER CENTER ONLY)
Abs Immature Granulocytes: 0.03 10*3/uL (ref 0.00–0.07)
Basophils Absolute: 0.1 10*3/uL (ref 0.0–0.1)
Basophils Relative: 1 %
Eosinophils Absolute: 0.1 10*3/uL (ref 0.0–0.5)
Eosinophils Relative: 1 %
HCT: 45.1 % (ref 39.0–52.0)
Hemoglobin: 15.2 g/dL (ref 13.0–17.0)
Immature Granulocytes: 0 %
Lymphocytes Relative: 20 %
Lymphs Abs: 1.7 10*3/uL (ref 0.7–4.0)
MCH: 33.6 pg (ref 26.0–34.0)
MCHC: 33.7 g/dL (ref 30.0–36.0)
MCV: 99.8 fL (ref 80.0–100.0)
Monocytes Absolute: 0.5 10*3/uL (ref 0.1–1.0)
Monocytes Relative: 6 %
Neutro Abs: 5.8 10*3/uL (ref 1.7–7.7)
Neutrophils Relative %: 72 %
Platelet Count: 207 10*3/uL (ref 150–400)
RBC: 4.52 MIL/uL (ref 4.22–5.81)
RDW: 12.7 % (ref 11.5–15.5)
WBC Count: 8.1 10*3/uL (ref 4.0–10.5)
nRBC: 0 % (ref 0.0–0.2)

## 2022-06-29 LAB — CMP (CANCER CENTER ONLY)
ALT: 25 U/L (ref 0–44)
AST: 23 U/L (ref 15–41)
Albumin: 4.1 g/dL (ref 3.5–5.0)
Alkaline Phosphatase: 95 U/L (ref 38–126)
Anion gap: 5 (ref 5–15)
BUN: 14 mg/dL (ref 8–23)
CO2: 29 mmol/L (ref 22–32)
Calcium: 9 mg/dL (ref 8.9–10.3)
Chloride: 106 mmol/L (ref 98–111)
Creatinine: 0.96 mg/dL (ref 0.61–1.24)
GFR, Estimated: >60 mL/min (ref >60)
Glucose, Bld: 126 mg/dL — ABNORMAL HIGH (ref 70–99)
Potassium: 5.2 mmol/L — ABNORMAL HIGH (ref 3.5–5.1)
Sodium: 140 mmol/L (ref 135–145)
Total Bilirubin: 0.4 mg/dL (ref 0.3–1.2)
Total Protein: 7.2 g/dL (ref 6.5–8.1)

## 2022-06-29 LAB — TSH: TSH: 1.479 u[IU]/mL (ref 0.350–4.500)

## 2022-06-29 NOTE — Progress Notes (Signed)
HEMATOLOGY/ONCOLOGY CLINIC NOTE  Date of Service: 06/29/22    Patient Care Team: Ronnell Freshwater, NP as PCP - General (Family Medicine) Nahser, Wonda Cheng, MD as PCP - Cardiology (Cardiology)  CHIEF COMPLAINTS/PURPOSE OF CONSULTATION:  Follow-up for continued management of metastatic melanoma  HISTORY OF PRESENTING ILLNESS:  Previous notes for details of initial presentation  INTERVAL HISTORY  George West is a 62 year old male is here for continued evaluation and management of metastatic melanoma.   I had a phone visit with the patient on 02/24/2022 and talked to his wife since patient was not present. She reported that the patient was doing well overall. During the phone visit, I discussed the recent PET scan and Brain MRI results which did not show ay clear evidence of residual melanoma. Patient and his wife had decided to hold off on his treatment.   Patient is accompanied by her wife during this visit. He reports he has been doing well overall without any new medical concerns since our last visit. He denies infection issues, skin lesions, fever, chills, night sweats, back pain, chest pain, testicular pain/swelling, or leg swelling.   Patient reports he had few mild episodes of dizziness, but they have not been severe. Patient reports that Creon 3600 unit did not help with his diarrhea, so he discontinued it. He reports of diarrhea around 4 times a day.   He complains of occasional mild headache and mild bilateral foot neuropathy. He describes the neuropathy as numbness and it gets better when he walks. Patient's wife notes that he was previously taking medication for neuropathy, but it did not help. He currently does not take any medication for neuropathy.   He still smokes cigarettes, but has cut down.   Patient does not have a dermatologist.    MEDICAL HISTORY:  Past Medical History:  Diagnosis Date   Adrenal insufficiency (HCC)    COPD (chronic obstructive  pulmonary disease) (HCC)    Depression    Dyspnea    with 29f of ambulation. 200 feet if he walks slow.   Fatty liver    History of kidney stones    seen on CT Scan   Skin cancer (melanoma) (HCC)    Thyroid disease     SURGICAL HISTORY: Past Surgical History:  Procedure Laterality Date   MASS EXCISION Right 12/15/2020   Procedure: EXCISION OF MALIGNANT MASS RIGHT POSTERIOR SHOULDER;  Surgeon: BStark Klein MD;  Location: [facility]  Service: General;  Laterality: Right;   WISDOM TOOTH EXTRACTION      SOCIAL HISTORY: Social History   Socioeconomic History   Marital status: Married    Spouse name: PMardene Celeste  Number of children: 2   Years of education: Not on file   Highest education level: Not on file  Occupational History   Occupation: PPublic librarian   Comment: [employer] Tobacco Use   Smoking status: Every Day    Packs/day: 0.50    Years: 40.00    Total pack years: 20.00    Types: Cigarettes    Start date: 05/17/1981   Smokeless tobacco: Never  Vaping Use   Vaping Use: Never used  Substance and Sexual Activity   Alcohol use: Yes    Alcohol/week: 6.0 standard drinks of alcohol    Types: 4 Cans of beer, 2 Standard drinks or equivalent per week    Comment: 4 per day   Drug use: No   Sexual activity: Yes  Partners: Female    Birth control/protection: None  Other Topics Concern   Not on file  Social History Narrative   Married   2 children--- 1 daughter/1 son   6 step children   Social Determinants of Health   Financial Resource Strain: Not on file  Food Insecurity: Not on file  Transportation Needs: Not on file  Physical Activity: Not on file  Stress: Not on file  Social Connections: Not on file  Intimate Partner Violence: Not on file    FAMILY HISTORY: Family History  Problem Relation Age of Onset   Stroke Mother    Dementia Mother    Breast cancer Sister    Thyroid cancer Sister    Ovarian cancer Sister    Stroke Maternal  Grandmother    Breast cancer Paternal Grandmother    Diabetes Paternal Aunt    Diabetes Paternal Uncle    Hypertension Daughter    Other Daughter        Uterine cysts   Thyroid disease Neg Hx     ALLERGIES:  is allergic to duloxetine.  MEDICATIONS:  Current Outpatient Medications  Medication Sig Dispense Refill   fludrocortisone (FLORINEF) 0.1 MG tablet Take 1 tablet Monday/Wednesday and Friday (Patient not taking: Reported on 03/04/2022) 15 tablet 2   Fluticasone-Umeclidin-Vilant (TRELEGY ELLIPTA) 100-62.5-25 MCG/ACT AEPB Inhale 1 puff into the lungs daily. 1 each 11   hydrocortisone (CORTEF) 20 MG tablet TAKE 1 TABLET BY MOUTH IN THE MORNING AND 1/2 TABLET AT 5:00 PM 135 tablet 1   lipase/protease/amylase (CREON) 36000 UNITS CPEP capsule Take 1 capsule (36,000 Units total) by mouth 3 (three) times daily with meals. May also take 1 capsule (36,000 Units total) as needed (with snacks). 90 capsule 1   loperamide (IMODIUM) 2 MG capsule Take 1 capsule (2 mg total) by mouth every 6 (six) hours as needed for diarrhea or loose stools (upto 4 doses a day). 30 capsule 0   Saccharomyces boulardii 500 MG PACK Take 500 mg by mouth in the morning and at bedtime. (Patient not taking: Reported on 12/09/2021) 20 each 0   triamcinolone (KENALOG) 0.025 % cream Apply 1 application. topically 2 (two) times daily. (Patient not taking: Reported on 12/09/2021) 80 g 2   No current facility-administered medications for this visit.    REVIEW OF SYSTEMS:   .10 Point review of Systems was done is negative except as noted above.  PHYSICAL EXAMINATION: .BP (!) 128/90   Pulse 65   Temp 97.9 F (36.6 C)   Resp 20   Wt 179 lb 12.8 oz (81.6 kg)   SpO2 97%   BMI 24.39 kg/m  . GENERAL:alert, in no acute distress and comfortable SKIN: no acute rashes, no significant lesions EYES: conjunctiva are pink and non-injected, sclera anicteric OROPHARYNX: MMM, no exudates, no oropharyngeal erythema or ulceration NECK:  supple, no JVD LYMPH:  no palpable lymphadenopathy in the cervical, axillary or inguinal regions LUNGS: clear to auscultation b/l with normal respiratory effort HEART: regular rate & rhythm ABDOMEN:  normoactive bowel sounds , non tender, not distended. Extremity: no pedal edema PSYCH: alert & oriented x 3 with fluent speech NEURO: no focal motor/sensory deficits   LABORATORY DATA:  I have reviewed the data as listed  .    Latest Ref Rng & Units 06/29/2022   11:52 AM 02/01/2022   12:53 PM 10/06/2021    8:34 AM  CBC  WBC 4.0 - 10.5 K/uL 8.1  9.2  8.4   Hemoglobin 13.0 -  17.0 g/dL 15.2  16.5  15.8   Hematocrit 39.0 - 52.0 % 45.1  47.4  45.4   Platelets 150 - 400 K/uL 207  204  220    .    Latest Ref Rng & Units 07/01/2022    8:05 AM 06/29/2022   11:52 AM 02/25/2022    8:18 AM  CMP  Glucose 70 - 99 mg/dL 97  126  120   BUN 6 - 23 mg/dL 16  14  9   $ Creatinine 0.40 - 1.50 mg/dL 0.98  0.96  0.89   Sodium 135 - 145 mEq/L 140  140  140   Potassium 3.5 - 5.1 mEq/L 4.3  5.2  4.3   Chloride 96 - 112 mEq/L 105  106  106   CO2 19 - 32 mEq/L 27  29  26   $ Calcium 8.4 - 10.5 mg/dL 9.3  9.0  9.1   Total Protein 6.5 - 8.1 g/dL  7.2    Total Bilirubin 0.3 - 1.2 mg/dL  0.4    Alkaline Phos 38 - 126 U/L  95    AST 15 - 41 U/L  23    ALT 0 - 44 U/L  25     . Lab Results  Component Value Date   LDH 144 06/16/2020    SURGICAL PATHOLOGY  CASE: [id number]  PATIENT: [facility]  Surgical Pathology Report   Clinical History: right posterior shoulder soft tissue mass (cm)   FINAL MICROSCOPIC DIAGNOSIS:   A. SOFT TISSUE MASS, RIGHT POSTERIOR SHOULDER, NEEDLE CORE BIOPSY:  -  Malignant melanoma  -  See comment   COMMENT:   By immunohistochemistry, the neoplastic cells are positive for S100,  HMB45, Sox 10 and Melan-A (patchy) but negative for CD45, CD138,  cytokeratin 7, cytokeratin 20, cytokeratin AE1/3, desmin and EMA.  The  morphology and immunophenotype are consistent  with malignant melanoma.  Dr. Irene Limbo was notified of these results on November 24, 2020.  Dr. Saralyn Pilar  reviewed the case and agrees with the above diagnosis.    RADIOGRAPHIC STUDIES: I have personally reviewed the radiological images as listed and agreed with the findings in the report. No results found.   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  Unclear primary site with the patient presented with a metastatic deposit in the soft tissue/skin over the right posterior shoulder. Patient also has multiple slightly hypermetabolic lymph nodes-right axillary, subpectoral and right cervical.  These could be from metastatic melanoma or a possible indolent lymphoma related to his IgM kappa monoclonal paraproteinemia. 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  GI panel and C. difficile testing negative.  Colonoscopy with biopsy rule out any colitis or microscopic colitis.  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 Lorenso Courier from GI.   PLAN: -discussed lab results from today, 06/29/2022, with the patient. CBC is stable. CMP is stable.  -Discussed the option of referring to dermatologist, but patient declined referral to see dermatologist.  -Answered all of patient's questions.  -Recommended PET/CT scan before our next visit. Patient agrees to the option of scans.  -Recommended to follow-up with PCP  regarding recurrent diarrhea prviously noted to be related to pancreatic insuff per GI. -patient currently has no overt symptoms suggestive of melanoma  recurrence/progression at this time.    FOLLOW UP: PET/CT in 14 weeks RTC with Dr Irene Limbo with labs in 16 weeks  The total time spent in the appointment was 25 minutes* .  All of the patient's questions were answered with apparent satisfaction. The patient knows to call the clinic with any problems, questions or concerns.   Sullivan Lone MD MS AAHIVMS [facility] Hematology/Oncology Physician [facility]  .*Total Encounter Time as defined by the Centers for Medicare and Medicaid Services includes, in addition to the face-to-face time of a patient visit (documented in the note above) non-face-to-face time: obtaining and reviewing outside history, ordering and reviewing medications, tests or procedures, care coordination (communications with other health care professionals or caregivers) and documentation in the medical record.   I, Cleda Mccreedy, am acting as a Education administrator for Sullivan Lone, MD. .I have reviewed the above documentation for accuracy and completeness, and I agree with the above. Brunetta Genera MD

## 2022-06-30 ENCOUNTER — Telehealth: Payer: Self-pay | Admitting: Hematology

## 2022-06-30 NOTE — Telephone Encounter (Signed)
Called patient per 2/13 los notes to schedule f/u. Left voicemail with new appointment information and contact details if needing to reschedule.

## 2022-07-01 ENCOUNTER — Other Ambulatory Visit (INDEPENDENT_AMBULATORY_CARE_PROVIDER_SITE_OTHER): Payer: BC Managed Care – PPO

## 2022-07-01 DIAGNOSIS — E271 Primary adrenocortical insufficiency: Secondary | ICD-10-CM | POA: Diagnosis not present

## 2022-07-01 LAB — BASIC METABOLIC PANEL
BUN: 16 mg/dL (ref 6–23)
CO2: 27 mEq/L (ref 19–32)
Calcium: 9.3 mg/dL (ref 8.4–10.5)
Chloride: 105 mEq/L (ref 96–112)
Creatinine, Ser: 0.98 mg/dL (ref 0.40–1.50)
GFR: 82.67 mL/min (ref >60.00)
Glucose, Bld: 97 mg/dL (ref 70–99)
Potassium: 4.3 mEq/L (ref 3.5–5.1)
Sodium: 140 mEq/L (ref 135–145)

## 2022-07-06 ENCOUNTER — Encounter: Payer: Self-pay | Admitting: Endocrinology

## 2022-07-06 ENCOUNTER — Ambulatory Visit (INDEPENDENT_AMBULATORY_CARE_PROVIDER_SITE_OTHER): Payer: BC Managed Care – PPO | Admitting: Endocrinology

## 2022-07-06 ENCOUNTER — Other Ambulatory Visit: Payer: Self-pay

## 2022-07-06 VITALS — BP 70/60 | HR 80 | Ht 72.0 in | Wt 170.6 lb

## 2022-07-06 DIAGNOSIS — R7301 Impaired fasting glucose: Secondary | ICD-10-CM

## 2022-07-06 DIAGNOSIS — E2740 Unspecified adrenocortical insufficiency: Secondary | ICD-10-CM | POA: Diagnosis not present

## 2022-07-06 DIAGNOSIS — I959 Hypotension, unspecified: Secondary | ICD-10-CM

## 2022-07-06 MED ORDER — CORTEF 20 MG PO TABS: 20.0000 mg | ORAL_TABLET | Freq: Every day | ORAL | 3 refills | Status: DC

## 2022-07-06 NOTE — Patient Instructions (Addendum)
Florinef 1 daily till standing BP > 100 then 1/2 daily  Double dose Rx till Saturday

## 2022-07-06 NOTE — Progress Notes (Signed)
Patient ID: George West, male   DOB: [birth year], 62 y.o.   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 after treatment with ipilimumab His main symptom was weight loss, tiredness, weakness and lightheadedness along with passing out starting in January 2023 This was diagnosed by his oncologist Baseline cortisol level was 1.1 and ACTH level was undetectable in 1/23 Although initially was treated with hydrocortisone and continued to have some syncope and Florinef was added but this was stopped subsequently when blood pressure went up  Because of low normal standing blood pressures in 8/23 he was started on Florinef 3 days a week and switched from prednisone to hydrocortisone 20 mg in the morning and 10 mg in the evening  He had stopped Florinef for a second time because of rising blood pressure up to 164 and symptoms of throbbing in the head and eye pain He has continued to take his hydrocortisone regularly Although in 2023 he tried to take a total of 20 mg hydrocortisone instead of 30 because of weight gain he started having decreased appetite and lethargy with this  Recent blood pressure readings have been around [id number] systolic at home About 4 to 5 days ago he started having decreased appetite and some malaise and for 2 or 3 days has been feeling lightheaded and feeling like passing out, did not report this until this morning His sitting blood pressure has been around 100 at home but he has been mostly lying down since yesterday  He apparently got a new prescription filled for the hydrocortisone about 2 weeks ago  Pituitary MRI showed 4 mm nodule  Lab Results  Component Value Date   CRTSLPL 6.5 06/23/2021   CRTSLPL 0.7 06/23/2021   CRTSLPL 1.1 05/22/2021    PITUITARY functions:  At baseline he had the following labs Prolactin: borderline high.   Total  testosterone 481 but free testosterone low at 1.6  THYROID:  TSH level was suppressed in 11/22 and free T4 was above normal only transiently in 1/23 at 1.82  He was given methimazole for short time but this was stopped when TSH went up higher at 11  Wt Readings from Last 3 Encounters:  07/06/22 170 lb 9.6 oz (77.4 kg)  06/29/22 179 lb 12.8 oz (81.6 kg)  03/04/22 173 lb 3.2 oz (78.6 kg)   Lab Results  Component Value Date   TSH 1.479 06/29/2022   TSH 1.512 02/01/2022   TSH 3.325 10/01/2021   FREET4 1.16 12/15/2021   FREET4 1.39 08/31/2021   FREET4 0.92 06/23/2021     Lab on 07/01/2022  Component Date Value Ref Range Status   Sodium 07/01/2022 140  135 - 145 mEq/L Final   Potassium 07/01/2022 4.3  3.5 - 5.1 mEq/L Final   Chloride 07/01/2022 105  96 - 112 mEq/L Final   CO2 07/01/2022 27  19 - 32 mEq/L Final   Glucose, Bld 07/01/2022 97  70 - 99 mg/dL Final   BUN 07/01/2022 16  6 - 23 mg/dL Final   Creatinine, Ser 07/01/2022 0.98  0.40 - 1.50 mg/dL Final   GFR 07/01/2022 82.67  >60.00 mL/min Final   Calculated using the CKD-EPI Creatinine Equation (2021)   Calcium 07/01/2022 9.3  8.4 - 10.5 mg/dL Final    Allergies as of 07/06/2022       Reactions   Duloxetine  dizziness        Medication List        Accurate as of July 06, 2022  9:22 AM. If you have any questions, ask your nurse or doctor.          fludrocortisone 0.1 MG tablet Commonly known as: FLORINEF Take 1 tablet Monday/Wednesday and Friday   hydrocortisone 20 MG tablet Commonly known as: CORTEF TAKE 1 TABLET BY MOUTH IN THE MORNING AND 1/2 TABLET AT 5:00 PM   lipase/protease/amylase 36000 UNITS Cpep capsule Commonly known as: Creon Take 1 capsule (36,000 Units total) by mouth 3 (three) times daily with meals. May also take 1 capsule (36,000 Units total) as needed (with snacks).   loperamide 2 MG capsule Commonly known as: IMODIUM Take 1 capsule (2 mg total) by mouth every 6 (six) hours  as needed for diarrhea or loose stools (upto 4 doses a day).   Saccharomyces boulardii 500 MG Pack Take 500 mg by mouth in the morning and at bedtime.   Trelegy Ellipta 100-62.5-25 MCG/ACT Aepb Generic drug: Fluticasone-Umeclidin-Vilant Inhale 1 puff into the lungs daily.   triamcinolone 0.025 % cream Commonly known as: KENALOG Apply 1 application. topically 2 (two) times daily.        Allergies:  Allergies  Allergen Reactions   Duloxetine     dizziness    Past Medical History:  Diagnosis Date   Adrenal insufficiency (HCC)    COPD (chronic obstructive pulmonary disease) (HCC)    Depression    Dyspnea    with 56f of ambulation. 200 feet if he walks slow.   Fatty liver    History of kidney stones    seen on CT Scan   Skin cancer (melanoma) (HCC)    Thyroid disease     Past Surgical History:  Procedure Laterality Date   MASS EXCISION Right 12/15/2020   Procedure: EXCISION OF MALIGNANT MASS RIGHT POSTERIOR SHOULDER;  Surgeon: BStark Klein MD;  Location: [facility]  Service: General;  Laterality: Right;   WISDOM TOOTH EXTRACTION      Family History  Problem Relation Age of Onset   Stroke Mother    Dementia Mother    Breast cancer Sister    Thyroid cancer Sister    Ovarian cancer Sister    Stroke Maternal Grandmother    Breast cancer Paternal Grandmother    Diabetes Paternal Aunt    Diabetes Paternal Uncle    Hypertension Daughter    Other Daughter        Uterine cysts   Thyroid disease Neg Hx     Social History:  reports that he has been smoking cigarettes. He started smoking about 41 years ago. He has a 20.00 pack-year smoking history. He has never used smokeless tobacco. He reports current alcohol use of about 6.0 standard drinks of alcohol per week. He reports that he does not use drugs.  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  Currently taking hydrocortisone  Previously was feeling fairly good with a total of 30 mg a day in divided doses until about 4 days ago He now appears to be having addisonian  symptoms with decreased appetite some weight loss and significant decrease in blood pressure  Not clear if this is related to switching hydrocortisone preparation to a different generic preparation 2 weeks ago  For now he will try to get the brand-name Cortef Also in the next 5 days he will take double the dose of hydrocortisone, extra doses were given in the office from his home prescription He will also start using Florinef at least 1 tablet daily till his blood pressure is significantly better and he has no lightheadedness and then go down to half a tablet today He will increase sodium intake  Need to check blood pressure regularly including standing at home  Impaired fasting glucose, previously 120 this is now normal   TSH levels normal, will also check free T4 on the next visit  Elayne Snare 07/06/2022, 9:22 AM    Total visit time for evaluation and management and counseling = 30 minutes

## 2022-07-09 MED ORDER — CORTEF 20 MG PO TABS: 20.0000 mg | ORAL_TABLET | Freq: Every day | ORAL | 3 refills | Status: DC

## 2022-07-09 NOTE — Addendum Note (Signed)
Addended by: Sarina Ill on: 07/09/2022 03:42 PM   Modules accepted: Orders

## 2022-07-28 ENCOUNTER — Ambulatory Visit (INDEPENDENT_AMBULATORY_CARE_PROVIDER_SITE_OTHER): Payer: BC Managed Care – PPO | Admitting: Endocrinology

## 2022-07-28 ENCOUNTER — Encounter: Payer: Self-pay | Admitting: Endocrinology

## 2022-07-28 VITALS — BP 132/86 | HR 66 | Ht 72.0 in | Wt 178.8 lb

## 2022-07-28 DIAGNOSIS — D352 Benign neoplasm of pituitary gland: Secondary | ICD-10-CM

## 2022-07-28 DIAGNOSIS — E2740 Unspecified adrenocortical insufficiency: Secondary | ICD-10-CM | POA: Diagnosis not present

## 2022-07-28 LAB — BASIC METABOLIC PANEL
BUN: 16 mg/dL (ref 6–23)
CO2: 25 mEq/L (ref 19–32)
Calcium: 9.7 mg/dL (ref 8.4–10.5)
Chloride: 104 mEq/L (ref 96–112)
Creatinine, Ser: 0.85 mg/dL (ref 0.40–1.50)
GFR: 93.11 mL/min (ref >60.00)
Glucose, Bld: 106 mg/dL — ABNORMAL HIGH (ref 70–99)
Potassium: 4.9 mEq/L (ref 3.5–5.1)
Sodium: 137 mEq/L (ref 135–145)

## 2022-07-28 LAB — TESTOSTERONE: Testosterone: 374.29 ng/dL (ref 300.00–890.00)

## 2022-07-28 LAB — T4, FREE: Free T4: 0.94 ng/dL (ref 0.60–1.60)

## 2022-07-28 NOTE — Progress Notes (Unsigned)
Patient ID: George West, male   DOB: Mar 17, 1961, 62 y.o.   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 after treatment with ipilimumab His main symptom was weight loss, tiredness, weakness and lightheadedness along with passing out starting in January 2023 This was diagnosed by his oncologist Baseline cortisol level was 1.1 and ACTH level was undetectable in 1/23 Although initially was treated with hydrocortisone and continued to have some syncope and Florinef was added but this was stopped subsequently when blood pressure went up  Because of low normal standing blood pressures in 8/23 he was started on Florinef 3 days a week and switched from prednisone to hydrocortisone 20 mg in the morning and 10 mg in the evening  He had stopped Florinef for a second time because of rising blood pressure up to 164 and symptoms of throbbing in the head and eye pain He has continued to take his hydrocortisone regularly Although in 2023 he tried to take a total of 20 mg hydrocortisone instead of 30 because of weight gain he started having decreased appetite and lethargy with this  Recent blood pressure readings have been around [id number] systolic at home About 4 to 5 days ago he started having decreased appetite and some malaise and for 2 or 3 days has been feeling lightheaded and feeling like passing out, did not report this until this morning  130/70  His sitting blood pressure has been around 100 at home but he has been mostly lying down since yesterday    Pituitary MRI showed 4 mm nodule  Lab Results  Component Value Date   CRTSLPL 6.5 06/23/2021   CRTSLPL 0.7 06/23/2021   CRTSLPL 1.1 05/22/2021    PITUITARY functions:  At baseline he had the following labs Prolactin: borderline high.   Total testosterone 481 but free testosterone low at 1.6  THYROID:  TSH level  was suppressed in 11/22 and free T4 was above normal only transiently in 1/23 at 1.82  He was given methimazole for short time but this was stopped when TSH went up higher at 11  Wt Readings from Last 3 Encounters:  07/28/22 178 lb 12.8 oz (81.1 kg)  07/06/22 170 lb 9.6 oz (77.4 kg)  06/29/22 179 lb 12.8 oz (81.6 kg)   Lab Results  Component Value Date   TSH 1.479 06/29/2022   TSH 1.512 02/01/2022   TSH 3.325 10/01/2021   FREET4 1.16 12/15/2021   FREET4 1.39 08/31/2021   FREET4 0.92 06/23/2021     No visits with results within 1 Week(s) from this visit.  Latest known visit with results is:  Lab on 07/01/2022  Component Date Value Ref Range Status   Sodium 07/01/2022 140  135 - 145 mEq/L Final   Potassium 07/01/2022 4.3  3.5 - 5.1 mEq/L Final   Chloride 07/01/2022 105  96 - 112 mEq/L Final   CO2 07/01/2022 27  19 - 32 mEq/L Final   Glucose, Bld 07/01/2022 97  70 - 99 mg/dL Final   BUN 07/01/2022 16  6 - 23 mg/dL Final   Creatinine, Ser 07/01/2022 0.98  0.40 - 1.50 mg/dL Final   GFR 07/01/2022 82.67  >60.00 mL/min Final   Calculated using the CKD-EPI Creatinine Equation (2021)   Calcium 07/01/2022 9.3  8.4 - 10.5 mg/dL Final    Allergies as of 07/28/2022  Reactions   Duloxetine    dizziness        Medication List        Accurate as of July 28, 2022  1:39 PM. If you have any questions, ask your nurse or doctor.          fludrocortisone 0.1 MG tablet Commonly known as: FLORINEF Take 1 tablet Monday/Wednesday and Friday   hydrocortisone 20 MG tablet Commonly known as: CORTEF TAKE 1 TABLET BY MOUTH IN THE MORNING AND 1/2 TABLET AT 5:00 PM   Cortef 20 MG tablet Generic drug: hydrocortisone Take 1 tablet (20 mg total) by mouth daily. 1 tab in am and 1/2 at 5 pm   lipase/protease/amylase 36000 UNITS Cpep capsule Commonly known as: Creon Take 1 capsule (36,000 Units total) by mouth 3 (three) times daily with meals. May also take 1 capsule (36,000 Units  total) as needed (with snacks).   loperamide 2 MG capsule Commonly known as: IMODIUM Take 1 capsule (2 mg total) by mouth every 6 (six) hours as needed for diarrhea or loose stools (upto 4 doses a day).   Saccharomyces boulardii 500 MG Pack Take 500 mg by mouth in the morning and at bedtime.   Trelegy Ellipta 100-62.5-25 MCG/ACT Aepb Generic drug: Fluticasone-Umeclidin-Vilant Inhale 1 puff into the lungs daily.   triamcinolone 0.025 % cream Commonly known as: KENALOG Apply 1 application. topically 2 (two) times daily.        Allergies:  Allergies  Allergen Reactions   Duloxetine     dizziness    Past Medical History:  Diagnosis Date   Adrenal insufficiency (HCC)    COPD (chronic obstructive pulmonary disease) (HCC)    Depression    Dyspnea    with 17f of ambulation. 200 feet if he walks slow.   Fatty liver    History of kidney stones    seen on CT Scan   Skin cancer (melanoma) (HCC)    Thyroid disease     Past Surgical History:  Procedure Laterality Date   MASS EXCISION Right 12/15/2020   Procedure: EXCISION OF MALIGNANT MASS RIGHT POSTERIOR SHOULDER;  Surgeon: BStark Klein MD;  [facility] [facility]  Service: General;  Laterality: Right;   WISDOM TOOTH EXTRACTION      Family History  Problem Relation Age of Onset   Stroke Mother    Dementia Mother    Breast cancer Sister    Thyroid cancer Sister    Ovarian cancer Sister    Stroke Maternal Grandmother    Breast cancer Paternal Grandmother    Diabetes Paternal Aunt    Diabetes Paternal Uncle    Hypertension Daughter    Other Daughter        Uterine cysts   Thyroid disease Neg Hx     Social History:  reports that he has been smoking cigarettes. He started smoking about 41 years ago. He has a 20.00 pack-year smoking history. He has never used smokeless tobacco. He reports current alcohol use of about 6.0 standard drinks of alcohol per week. He reports that he does not use drugs.  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  Currently taking hydrocortisone  Previously was feeling  fairly good with a total of 30 mg a day in divided doses until about 4 days ago He now appears to be having addisonian symptoms with decreased appetite some weight loss and significant decrease in blood pressure  Not clear if this is related to switching hydrocortisone preparation to a different generic preparation 2 weeks ago  For now he will try to get the brand-name Cortef Also in the next 5 days he will take double the dose of hydrocortisone, extra doses were given in the office from his home prescription He will also start using Florinef at least 1 tablet daily till his blood pressure is significantly better and he has no lightheadedness and then go down to half a tablet today He will increase sodium intake  Need to check blood pressure regularly including standing at home  Impaired fasting glucose, previously 120 this is now normal   TSH levels normal, will also check free T4 on the next visit  Elayne Snare 07/28/2022, 1:39 PM

## 2022-09-20 ENCOUNTER — Other Ambulatory Visit: Payer: Self-pay | Admitting: Endocrinology

## 2022-09-20 ENCOUNTER — Other Ambulatory Visit: Payer: Self-pay | Admitting: Nurse Practitioner

## 2022-09-20 DIAGNOSIS — J449 Chronic obstructive pulmonary disease, unspecified: Secondary | ICD-10-CM

## 2022-09-20 NOTE — Telephone Encounter (Signed)
Appt has been scheduled for 10/19/22

## 2022-09-21 ENCOUNTER — Telehealth: Payer: Self-pay

## 2022-09-21 DIAGNOSIS — E0590 Thyrotoxicosis, unspecified without thyrotoxic crisis or storm: Secondary | ICD-10-CM

## 2022-09-21 NOTE — Telephone Encounter (Signed)
Orders Placed This Encounter  Procedures   T3, free    Standing Status:   Future    Standing Expiration Date:   03/24/2023   T4, free    Standing Status:   Future    Standing Expiration Date:   03/24/2023   Thyroid stimulating immunoglobulin    Standing Status:   Future    Standing Expiration Date:   03/24/2023   Thyrotropin receptor autoabs    Standing Status:   Future    Standing Expiration Date:   03/24/2023   TSH    Standing Status:   Future    Standing Expiration Date:   03/24/2023

## 2022-09-22 ENCOUNTER — Other Ambulatory Visit (INDEPENDENT_AMBULATORY_CARE_PROVIDER_SITE_OTHER): Payer: BC Managed Care – PPO

## 2022-09-22 DIAGNOSIS — E2740 Unspecified adrenocortical insufficiency: Secondary | ICD-10-CM

## 2022-09-22 DIAGNOSIS — E0590 Thyrotoxicosis, unspecified without thyrotoxic crisis or storm: Secondary | ICD-10-CM | POA: Diagnosis not present

## 2022-09-22 LAB — TSH: TSH: 2.19 u[IU]/mL (ref 0.35–5.50)

## 2022-09-22 LAB — T4, FREE: Free T4: 1.14 ng/dL (ref 0.60–1.60)

## 2022-09-22 LAB — BASIC METABOLIC PANEL
BUN: 11 mg/dL (ref 6–23)
CO2: 22 mEq/L (ref 19–32)
Calcium: 8.8 mg/dL (ref 8.4–10.5)
Chloride: 104 mEq/L (ref 96–112)
Creatinine, Ser: 0.87 mg/dL (ref 0.40–1.50)
GFR: 92.36 mL/min (ref >60.00)
Glucose, Bld: 105 mg/dL — ABNORMAL HIGH (ref 70–99)
Potassium: 4.1 mEq/L (ref 3.5–5.1)
Sodium: 139 mEq/L (ref 135–145)

## 2022-09-22 LAB — T3, FREE: T3, Free: 3.4 pg/mL (ref 2.3–4.2)

## 2022-09-23 LAB — THYROTROPIN RECEPTOR AUTOABS: Thyrotropin Receptor Ab: 2.95 IU/L — ABNORMAL HIGH (ref 0.00–1.75)

## 2022-09-24 ENCOUNTER — Encounter: Payer: Self-pay | Admitting: "Endocrinology

## 2022-09-24 ENCOUNTER — Ambulatory Visit (INDEPENDENT_AMBULATORY_CARE_PROVIDER_SITE_OTHER): Payer: BC Managed Care – PPO | Admitting: "Endocrinology

## 2022-09-24 VITALS — BP 138/70 | HR 80 | Ht 72.0 in | Wt 182.2 lb

## 2022-09-24 DIAGNOSIS — D352 Benign neoplasm of pituitary gland: Secondary | ICD-10-CM | POA: Diagnosis not present

## 2022-09-24 DIAGNOSIS — E2740 Unspecified adrenocortical insufficiency: Secondary | ICD-10-CM

## 2022-09-24 LAB — THYROID STIMULATING IMMUNOGLOBULIN: TSI: <89 % baseline (ref <140)

## 2022-09-24 MED ORDER — HYDROCORTISONE 10 MG PO TABS: 30.0000 mg | ORAL_TABLET | Freq: Every day | ORAL | 2 refills | Status: AC

## 2022-09-24 NOTE — Progress Notes (Signed)
George [place], MD 09/24/22   Patient ID: George West, male   DOB: 18-Apr-1961, 62 y.o.   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 after treatment with ipilimumab His main symptom was weight loss, tiredness, weakness and lightheadedness along with passing out starting in January 2023 This was diagnosed by his oncologist Baseline cortisol level was 1.1 and ACTH level was undetectable in 1/23 Although initially was treated with hydrocortisone and continued to have some syncope and Florinef was added but this was stopped subsequently when blood pressure went up  Because of low normal standing blood pressures in 8/23 he was started on Florinef 3 days a week and switched from prednisone to hydrocortisone 20 mg in the morning and 10 mg in the evening  He had stopped Florinef for a second time because of rising blood pressure up to 164 and symptoms of throbbing in the head and eye pain  Recent history:  He has continued to take his hydrocortisone regularly He is usually not able to take fludrocortisone except for a short time because of rising blood pressure and headaches  On his last visit blood pressure readings had been around 100 systolic at home and he was having significant decreased appetite, feeling lightheaded and feeling like passing out This improved significantly after doubling his hydrocortisone for a week However he says that when he went back to the regimen of 20 mg hydrocortisone in the morning and 10 in the afternoon he started getting fatigued and drowsy in the early afternoon With this he has started taking an extra half a tablet of Cortef around 1 PM with improvement in symptoms but feels the same today in the office He says he is getting the brand-name Cortef  Recent blood pressure at home apparently about 138/70  C/o thinning skin  04/2021  MRI HEAD WITHOUT AND WITH CONTRAST: Questionable pituitary MRI showed 4 mm nodule 02/2022 MRI HEAD WITHOUT AND WITH CONTRAST: Stable hypoenhancing area on the left side of the pituitary. Thismay represent a pituitary microadenoma    Lab Results  Component Value Date   CRTSLPL 6.5 06/23/2021   CRTSLPL 0.7 06/23/2021   CRTSLPL 1.1 05/22/2021    PITUITARY functions:  At baseline he had the following labs Prolactin: borderline high.   Total testosterone 481 but free testosterone low at 1.6  THYROID:  TSH level was suppressed in 11/22 and free T4 was above normal only transiently in 1/23 at 1.82  He was given methimazole for short time but this was stopped when TSH went up higher at 11  Wt Readings from Last 3 Encounters:  09/24/22 182 lb 3.2 oz (82.6 kg)  07/28/22 178 lb 12.8 oz (81.1 kg)  07/06/22 170 lb 9.6 oz (77.4 kg)   Lab Results  Component Value Date   TSH 2.19 09/22/2022   TSH 1.479 06/29/2022   TSH 1.512 02/01/2022   FREET4 1.14 09/22/2022   FREET4 0.94 07/28/2022   FREET4 1.16 12/15/2021   Component     Latest Ref Rng 06/23/2021 12/15/2021 07/28/2022  Testosterone     300.00 - 890.00 ng/dL 409  811.91  478.29      Lab on 09/22/2022  Component Date Value Ref Range Status   TSH 09/22/2022 2.19  0.35 - 5.50 uIU/mL Final   Thyrotropin Receptor Ab 09/22/2022 2.95 (H)  0.00 - 1.75 IU/L Final   Free T4 09/22/2022 1.14  0.60 - 1.60 ng/dL Final   Comment: Specimens from patients who are undergoing biotin therapy and /or ingesting biotin supplements may contain high levels of biotin.  The higher biotin concentration in these specimens interferes with this Free T4 assay.  Specimens that contain high levels  of biotin may cause false high results for this Free T4 assay.  Please interpret results in light of the total clinical presentation of the patient.     T3, Free 09/22/2022 3.4  2.3 - 4.2 pg/mL Final   Sodium 09/22/2022 139  135 - 145 mEq/L Final   Potassium 09/22/2022  4.1  3.5 - 5.1 mEq/L Final   Chloride 09/22/2022 104  96 - 112 mEq/L Final   CO2 09/22/2022 22  19 - 32 mEq/L Final   Glucose, Bld 09/22/2022 105 (H)  70 - 99 mg/dL Final   BUN 16/02/9603 11  6 - 23 mg/dL Final   Creatinine, Ser 09/22/2022 0.87  0.40 - 1.50 mg/dL Final   GFR 54/01/8118 92.36  >60.00 mL/min Final   Calculated using the CKD-EPI Creatinine Equation (2021)   Calcium 09/22/2022 8.8  8.4 - 10.5 mg/dL Final    Allergies as of 09/24/2022       Reactions   Duloxetine    dizziness        Medication List        Accurate as of Sep 24, 2022 10:54 AM. If you have any questions, ask your nurse or doctor.          fludrocortisone 0.1 MG tablet Commonly known as: FLORINEF Take 1 tablet Monday/Wednesday and Friday   hydrocortisone 10 MG tablet Commonly known as: CORTEF Take 3 tablets (30 mg total) by mouth daily. What changed:  medication strength how much to take additional instructions Another medication with the same name was removed. Continue taking this medication, and follow the directions you see here. Changed by: George [place], MD   lipase/protease/amylase 14782 UNITS Cpep capsule Commonly known as: Creon Take 1 capsule (36,000 Units total) by mouth 3 (three) times daily with meals. May also take 1 capsule (36,000 Units total) as needed (with snacks).   loperamide 2 MG capsule Commonly known as: IMODIUM Take 1 capsule (2 mg total) by mouth every 6 (six) hours as needed for diarrhea or loose stools (upto 4 doses a day).   Saccharomyces boulardii 500 MG Pack Take 500 mg by mouth in the morning and at bedtime.   Trelegy Ellipta 100-62.5-25 MCG/ACT Aepb Generic drug: Fluticasone-Umeclidin-Vilant INHALE 1 PUFF INTO LUNGS ONCE DAILY   triamcinolone 0.025 % cream Commonly known as: KENALOG Apply 1 application. topically 2 (two) times daily.        Allergies:  Allergies  Allergen Reactions   Duloxetine     dizziness    Past Medical History:   Diagnosis Date   Adrenal insufficiency (HCC)    COPD (chronic obstructive pulmonary disease) (HCC)    Depression    Dyspnea    with 9ft of ambulation. 200 feet if he walks slow.   Fatty liver    History of kidney stones    seen on CT Scan   Skin cancer (melanoma) (HCC)    Thyroid disease     Past Surgical History:  Procedure Laterality Date   MASS EXCISION Right 12/15/2020   Procedure: EXCISION OF MALIGNANT MASS RIGHT POSTERIOR SHOULDER;  Surgeon: Almond Lint, MD;  Location: [facility];  Service: General;  Laterality: Right;   WISDOM TOOTH EXTRACTION  Family History  Problem Relation Age of Onset   Stroke Mother    Dementia Mother    Breast cancer Sister    Thyroid cancer Sister    Ovarian cancer Sister    Stroke Maternal Grandmother    Breast cancer Paternal Grandmother    Diabetes Paternal Aunt    Diabetes Paternal Uncle    Hypertension Daughter    Other Daughter        Uterine cysts   Thyroid disease Neg Hx     Social History:  reports that he has been smoking cigarettes. He started smoking about 41 years ago. He has a 20.00 pack-year smoking history. He has never used smokeless tobacco. He reports current alcohol use of about 6.0 standard drinks of alcohol per week. He reports that he does not use drugs.  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 Currently taking Cortef with a total of 40 mg a day in divided 3 doses  Discussed patient  the side effects of excessive hydrocortisone, patient agrees and feels that he has been dealing with the same including behavioral issues as well as skin thinning Patient finds no difference between brand Cortef and generic hydrocortisone Instructed to take 20 mg at 7 in the morning and 10 mg at 3 PM Instructed to continue checking blood pressure at home, if blood pressure drops, try half a pill of 0.1 mg Florinef as tolerated If continues to have syncope, follow-up with nephrology Reports already following with cardiac etiology and neurology in the past.    2024 testosterone and free T4, free T3, TSH WNL   Sanayah Munro 09/24/2022, 10:54 AM

## 2022-09-27 ENCOUNTER — Ambulatory Visit: Payer: BC Managed Care – PPO | Admitting: "Endocrinology

## 2022-10-14 ENCOUNTER — Encounter (HOSPITAL_COMMUNITY): Payer: Self-pay

## 2022-10-14 ENCOUNTER — Encounter: Payer: Self-pay | Admitting: Hematology

## 2022-10-14 ENCOUNTER — Encounter (HOSPITAL_COMMUNITY)
Admission: RE | Admit: 2022-10-14 | Discharge: 2022-10-14 | Disposition: A | Discharge status: Home / Self Care | Payer: BC Managed Care – PPO | Source: Ambulatory Visit | Attending: Hematology | Admitting: Hematology

## 2022-10-14 DIAGNOSIS — C4359 Malignant melanoma of other part of trunk: Secondary | ICD-10-CM | POA: Insufficient documentation

## 2022-10-14 MED ORDER — FLUDEOXYGLUCOSE F - 18 (FDG) INJECTION: 9.1000 | Freq: Once | INTRAVENOUS | Status: DC

## 2022-10-18 ENCOUNTER — Other Ambulatory Visit: Payer: Self-pay

## 2022-10-18 DIAGNOSIS — C4359 Malignant melanoma of other part of trunk: Secondary | ICD-10-CM

## 2022-10-19 ENCOUNTER — Encounter: Payer: Self-pay | Admitting: "Endocrinology

## 2022-10-19 ENCOUNTER — Other Ambulatory Visit: Payer: Self-pay | Admitting: Nurse Practitioner

## 2022-10-19 ENCOUNTER — Encounter: Payer: Self-pay | Admitting: Nurse Practitioner

## 2022-10-19 ENCOUNTER — Ambulatory Visit (INDEPENDENT_AMBULATORY_CARE_PROVIDER_SITE_OTHER): Payer: BC Managed Care – PPO | Admitting: Nurse Practitioner

## 2022-10-19 ENCOUNTER — Inpatient Hospital Stay (HOSPITAL_BASED_OUTPATIENT_CLINIC_OR_DEPARTMENT_OTHER): Payer: BC Managed Care – PPO | Admitting: Hematology

## 2022-10-19 ENCOUNTER — Inpatient Hospital Stay: Payer: BC Managed Care – PPO | Attending: Hematology

## 2022-10-19 VITALS — BP 116/73 | HR 68 | Ht 72.0 in | Wt 177.8 lb

## 2022-10-19 VITALS — BP 122/71 | HR 61 | Temp 97.9°F | Resp 20 | Wt 179.5 lb

## 2022-10-19 DIAGNOSIS — E0590 Thyrotoxicosis, unspecified without thyrotoxic crisis or storm: Secondary | ICD-10-CM

## 2022-10-19 DIAGNOSIS — R7301 Impaired fasting glucose: Secondary | ICD-10-CM | POA: Diagnosis not present

## 2022-10-19 DIAGNOSIS — C4359 Malignant melanoma of other part of trunk: Secondary | ICD-10-CM

## 2022-10-19 DIAGNOSIS — C4360 Malignant melanoma of unspecified upper limb, including shoulder: Secondary | ICD-10-CM | POA: Insufficient documentation

## 2022-10-19 DIAGNOSIS — J449 Chronic obstructive pulmonary disease, unspecified: Secondary | ICD-10-CM

## 2022-10-19 DIAGNOSIS — D472 Monoclonal gammopathy: Secondary | ICD-10-CM | POA: Insufficient documentation

## 2022-10-19 DIAGNOSIS — Z79899 Other long term (current) drug therapy: Secondary | ICD-10-CM | POA: Diagnosis not present

## 2022-10-19 DIAGNOSIS — C880 Waldenstrom macroglobulinemia: Secondary | ICD-10-CM | POA: Insufficient documentation

## 2022-10-19 DIAGNOSIS — Z720 Tobacco use: Secondary | ICD-10-CM | POA: Diagnosis not present

## 2022-10-19 DIAGNOSIS — F17210 Nicotine dependence, cigarettes, uncomplicated: Secondary | ICD-10-CM | POA: Diagnosis not present

## 2022-10-19 DIAGNOSIS — Z122 Encounter for screening for malignant neoplasm of respiratory organs: Secondary | ICD-10-CM

## 2022-10-19 LAB — CMP (CANCER CENTER ONLY)
ALT: 23 U/L (ref 0–44)
AST: 25 U/L (ref 15–41)
Albumin: 4.2 g/dL (ref 3.5–5.0)
Alkaline Phosphatase: 115 U/L (ref 38–126)
Anion gap: 6 (ref 5–15)
BUN: 11 mg/dL (ref 8–23)
CO2: 28 mmol/L (ref 22–32)
Calcium: 9.4 mg/dL (ref 8.9–10.3)
Chloride: 104 mmol/L (ref 98–111)
Creatinine: 1.05 mg/dL (ref 0.61–1.24)
GFR, Estimated: >60 mL/min (ref >60)
Glucose, Bld: 172 mg/dL — ABNORMAL HIGH (ref 70–99)
Potassium: 4 mmol/L (ref 3.5–5.1)
Sodium: 138 mmol/L (ref 135–145)
Total Bilirubin: 0.8 mg/dL (ref 0.3–1.2)
Total Protein: 7.6 g/dL (ref 6.5–8.1)

## 2022-10-19 LAB — CBC WITH DIFFERENTIAL (CANCER CENTER ONLY)
Abs Immature Granulocytes: 0.03 10*3/uL (ref 0.00–0.07)
Basophils Absolute: 0.1 10*3/uL (ref 0.0–0.1)
Basophils Relative: 1 %
Eosinophils Absolute: 0.1 10*3/uL (ref 0.0–0.5)
Eosinophils Relative: 2 %
HCT: 46.6 % (ref 39.0–52.0)
Hemoglobin: 16.1 g/dL (ref 13.0–17.0)
Immature Granulocytes: 0 %
Lymphocytes Relative: 19 %
Lymphs Abs: 1.7 10*3/uL (ref 0.7–4.0)
MCH: 33.6 pg (ref 26.0–34.0)
MCHC: 34.5 g/dL (ref 30.0–36.0)
MCV: 97.3 fL (ref 80.0–100.0)
Monocytes Absolute: 0.5 10*3/uL (ref 0.1–1.0)
Monocytes Relative: 6 %
Neutro Abs: 6.4 10*3/uL (ref 1.7–7.7)
Neutrophils Relative %: 72 %
Platelet Count: 224 10*3/uL (ref 150–400)
RBC: 4.79 MIL/uL (ref 4.22–5.81)
RDW: 12 % (ref 11.5–15.5)
WBC Count: 8.8 10*3/uL (ref 4.0–10.5)
nRBC: 0 % (ref 0.0–0.2)

## 2022-10-19 LAB — TSH: TSH: 1.93 u[IU]/mL (ref 0.350–4.500)

## 2022-10-19 NOTE — Assessment & Plan Note (Signed)
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  Continue current medication.

## 2022-10-19 NOTE — Assessment & Plan Note (Signed)
Current and everyday smoker.  -CT lung cancer screening ordered for further evaluation  -patient not ready to quit smoking at this time.

## 2022-10-19 NOTE — Assessment & Plan Note (Signed)
Recent labs show stability  -continue regular visits with endocrinology as scheduled

## 2022-10-19 NOTE — Telephone Encounter (Signed)
Please Advise

## 2022-10-19 NOTE — Progress Notes (Signed)
HEMATOLOGY/ONCOLOGY CLINIC NOTE  Date of Service: 10/19/22   Patient Care Team: Carlean Jews, NP as PCP - General (Family Medicine) Nahser, Deloris Ping, MD as PCP - Cardiology (Cardiology)  CHIEF COMPLAINTS/PURPOSE OF CONSULTATION:  Follow-up for continued management of metastatic melanoma  HISTORY OF PRESENTING ILLNESS:  Previous notes for details of initial presentation  INTERVAL HISTORY  George West is a 62 year old male is here for continued evaluation and management of metastatic melanoma.   Patient was last seen by me on 06/29/2022 and he complained of mild dizziness, diarrhea, occasional mild headaches, mild bilateral feet neuropathy.  Patient is accompanied by his wife during this visit. He reports he has been doing well overall without any severe medical concerns. He does complain of fatigue since our last visit.   Patient notes he continues to follow-up with his endocrinologist and changed his Hydrocortisone from 30 mg to 20 mg and Fludrocortisone to 1 pill everyday instead of 3 times a week.   His blood pressure during this visit is 122/71 with pulse rate of 61. His wife notes his blood pressure was low at home around 1-2 days ago.   He denies any new infection issues, fever, chills, night sweats, unexpected weight loss, diarrhea, abdominal pain, new lumps/bumps, skin lesions, chest pain, back pain, headaches, vision changes, or leg swelling. He does complains of bilateral leg neuropathy when he is seating. Patient has a right shoulder nodule during today's examination. He does complain of right shoulder soreness.  Patient complains of mild blistering sunburns.   Patient's wife notes that the patient has been having memory issues. He is having trouble with short-term memory problems.    MEDICAL HISTORY:  Past Medical History:  Diagnosis Date   Adrenal insufficiency (HCC)    COPD (chronic obstructive pulmonary disease) (HCC)    Depression    Dyspnea     with 108ft of ambulation. 200 feet if he walks slow.   Fatty liver    History of kidney stones    seen on CT Scan   Skin cancer (melanoma) (HCC)    Thyroid disease     SURGICAL HISTORY: Past Surgical History:  Procedure Laterality Date   MASS EXCISION Right 12/15/2020   Procedure: EXCISION OF MALIGNANT MASS RIGHT POSTERIOR SHOULDER;  Surgeon: Almond Lint, MD;  Location: [facility] OR;  Service: General;  Laterality: Right;   WISDOM TOOTH EXTRACTION      SOCIAL HISTORY: Social History   Socioeconomic History   Marital status: Married    Spouse name: Elease Hashimoto   Number of children: 2   Years of education: Not on file   Highest education level: Not on file  Occupational History   Occupation: Public librarian    Comment: [name]  Tobacco Use   Smoking status: Every Day    Packs/day: 0.50    Years: 40.00    Additional pack years: 0.00    Total pack years: 20.00    Types: Cigarettes    Start date: 05/17/1981   Smokeless tobacco: Never  Vaping Use   Vaping Use: Never used  Substance and Sexual Activity   Alcohol use: Yes    Alcohol/week: 6.0 standard drinks of alcohol    Types: 4 Cans of beer, 2 Standard drinks or equivalent per week    Comment: 4 per day   Drug use: No   Sexual activity: Yes    Partners: Female    Birth control/protection: None  Other Topics Concern  Not on file  Social History Narrative   Married   2 children--- 1 daughter/1 son   6 step children   Social Determinants of Corporate investment banker Strain: Not on file  Food Insecurity: Not on file  Transportation Needs: Not on file  Physical Activity: Not on file  Stress: Not on file  Social Connections: Not on file  Intimate Partner Violence: Not on file    FAMILY HISTORY: Family History  Problem Relation Age of Onset   Stroke Mother    Dementia Mother    Breast cancer Sister    Thyroid cancer Sister    Ovarian cancer Sister    Stroke Maternal Grandmother    Breast cancer  Paternal Grandmother    Diabetes Paternal Aunt    Diabetes Paternal Uncle    Hypertension Daughter    Other Daughter        Uterine cysts   Thyroid disease Neg Hx     ALLERGIES:  is allergic to duloxetine.  MEDICATIONS:  Current Outpatient Medications  Medication Sig Dispense Refill   fludrocortisone (FLORINEF) 0.1 MG tablet Take 1 tablet Monday/Wednesday and Friday (Patient not taking: Reported on 09/24/2022) 15 tablet 2   hydrocortisone (CORTEF) 10 MG tablet Take 3 tablets (30 mg total) by mouth daily. 90 tablet 2   Saccharomyces boulardii 500 MG PACK Take 500 mg by mouth in the morning and at bedtime. (Patient not taking: Reported on 09/24/2022) 20 each 0   TRELEGY ELLIPTA 100-62.5-25 MCG/ACT AEPB INHALE 1 PUFF ONCE DAILY 60 each 0   No current facility-administered medications for this visit.    REVIEW OF SYSTEMS:   .10 Point review of Systems was done is negative except as noted above.  PHYSICAL EXAMINATION: .BP 122/71   Pulse 61   Temp 97.9 F (36.6 C)   Resp 20   Wt 179 lb 8 oz (81.4 kg)   SpO2 95%   BMI 24.34 kg/m  . GENERAL:alert, in no acute distress and comfortable SKIN: no acute rashes, no significant lesions EYES: conjunctiva are pink and non-injected, sclera anicteric OROPHARYNX: MMM, no exudates, no oropharyngeal erythema or ulceration NECK: supple, no JVD LYMPH:  no palpable lymphadenopathy in the cervical, axillary or inguinal regions LUNGS: clear to auscultation b/l with normal respiratory effort HEART: regular rate & rhythm ABDOMEN:  normoactive bowel sounds , non tender, not distended. Extremity: no pedal edema PSYCH: alert & oriented x 3 with fluent speech NEURO: no focal motor/sensory deficits   LABORATORY DATA:  I have reviewed the data as listed  .    Latest Ref Rng & Units 10/19/2022   11:38 AM 06/29/2022   11:52 AM 02/01/2022   12:53 PM  CBC  WBC 4.0 - 10.5 K/uL 8.8  8.1  9.2   Hemoglobin 13.0 - 17.0 g/dL 16.1  09.6  04.5   Hematocrit  39.0 - 52.0 % 46.6  45.1  47.4   Platelets 150 - 400 K/uL 224  207  204    .    Latest Ref Rng & Units 10/19/2022   11:38 AM 09/22/2022    8:22 AM 07/28/2022    2:02 PM  CMP  Glucose 70 - 99 mg/dL 409  811  914   BUN 8 - 23 mg/dL 11  11  16    Creatinine 0.61 - 1.24 mg/dL 7.82  9.56  2.13   Sodium 135 - 145 mmol/L 138  139  137   Potassium 3.5 - 5.1 mmol/L 4.0  4.1  4.9   Chloride 98 - 111 mmol/L 104  104  104   CO2 22 - 32 mmol/L 28  22  25    Calcium 8.9 - 10.3 mg/dL 9.4  8.8  9.7   Total Protein 6.5 - 8.1 g/dL 7.6     Total Bilirubin 0.3 - 1.2 mg/dL 0.8     Alkaline Phos 38 - 126 U/L 115     AST 15 - 41 U/L 25     ALT 0 - 44 U/L 23      . Lab Results  Component Value Date   LDH 144 06/16/2020    SURGICAL PATHOLOGY  CASE: [id number]  PATIENT: [facility]  Surgical Pathology Report   Clinical History: right posterior shoulder soft tissue mass (cm)   FINAL MICROSCOPIC DIAGNOSIS:   A. SOFT TISSUE MASS, RIGHT POSTERIOR SHOULDER, NEEDLE CORE BIOPSY:  -  Malignant melanoma  -  See comment   COMMENT:   By immunohistochemistry, the neoplastic cells are positive for S100,  HMB45, Sox 10 and Melan-A (patchy) but negative for CD45, CD138,  cytokeratin 7, cytokeratin 20, cytokeratin AE1/3, desmin and EMA.  The  morphology and immunophenotype are consistent with malignant melanoma.  Dr. Candise Che was notified of these results on November 24, 2020.  Dr. Luisa Hart  reviewed the case and agrees with the above diagnosis.    RADIOGRAPHIC STUDIES: I have personally reviewed the radiological images as listed and agreed with the findings in the report. No results found.   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  Unclear primary site with the patient presented with a metastatic deposit in the soft tissue/skin over the right posterior shoulder. Patient also has  multiple slightly hypermetabolic lymph nodes-right axillary, subpectoral and right cervical.  These could be from metastatic melanoma or a possible indolent lymphoma related to his IgM kappa monoclonal paraproteinemia. 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  GI panel and C. difficile testing negative.  Colonoscopy with biopsy rule out any colitis or microscopic colitis.  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 Leonides Schanz from GI.  PLAN: -Discussed lab results from today, 10/19/2022, with the patient. CBC and CMP stable.  -Discussed the option of CT scan in the next couple of weeks. Patient agrees. -Schedule patient for CT scan.  -Continue to follow-up with endocrinologist.  -Discussed with the patient that we need to biopsy the nodule near his right shoulder. Patient agrees. -Will refer the patient to his previous surgery team for the biopsy.  -Answered all of patient's questions.   FOLLOW UP: Referral to [place] surgery for new skin nodule at site of previous melanoma resection -ct neck/CAP in 1 week Phone visit with Dr Candise Che in 3 weeks   The total time spent in the appointment was 20 minutes* .  All of the patient's questions were answered with apparent satisfaction. The patient knows to call the clinic with any problems, questions or concerns.   Wyvonnia Lora MD MS AAHIVMS [facility] Hematology/Oncology Physician [facility]  .*Total Encounter Time as defined by the Centers for Medicare and Medicaid Services includes, in addition to  the face-to-face time of a patient visit (documented in the note above) non-face-to-face time: obtaining  and reviewing outside history, ordering and reviewing medications, tests or procedures, care coordination (communications with other health care professionals or caregivers) and documentation in the medical record.  I, Ok Edwards, am acting as a Neurosurgeon for Wyvonnia Lora, MD. .I have reviewed the above documentation for accuracy and completeness, and I agree with the above. Johney Maine MD

## 2022-10-19 NOTE — Assessment & Plan Note (Signed)
Check HgbA1c today with lab draw at [facility].  Recent glucose 105

## 2022-10-19 NOTE — Progress Notes (Signed)
Established patient visit   Patient: George West   DOB: 04-08-1961   62 y.o. Male  MRN: [id number] Visit Date: 10/19/2022   Chief Complaint  Patient presents with   Medical Management of Chronic Issues   Subjective    HPI  Follow up  -CoPD Stable.  Uses trelegy daily Does not need rescue inhaler  Due to have lung cancer screen Treatment per endocrinology for hyperthyroid and adrenal insufficiency He does go to acncer center every 4 months for management of malignant melanoma  -He denies chest pain, chest pressure, or shortness of breath. He denies headaches or visual disturbances. He denies abdominal pain, nausea, vomiting, or changes in bowel or bladder habits.    Past Medical History:  Diagnosis Date   Adrenal insufficiency (HCC)    COPD (chronic obstructive pulmonary disease) (HCC)    Depression    Dyspnea    with 60ft of ambulation. 200 feet if he walks slow.   Fatty liver    History of kidney stones    seen on CT Scan   Skin cancer (melanoma) (HCC)    Thyroid disease     Medications: Outpatient Medications Prior to Visit  Medication Sig   hydrocortisone (CORTEF) 10 MG tablet Take 3 tablets (30 mg total) by mouth daily.   [DISCONTINUED] TRELEGY ELLIPTA 100-62.5-25 MCG/ACT AEPB INHALE 1 PUFF INTO LUNGS ONCE DAILY   fludrocortisone (FLORINEF) 0.1 MG tablet Take 1 tablet Monday/Wednesday and Friday (Patient not taking: Reported on 09/24/2022)   Saccharomyces boulardii 500 MG PACK Take 500 mg by mouth in the morning and at bedtime. (Patient not taking: Reported on 09/24/2022)   [DISCONTINUED] lipase/protease/amylase (CREON) 36000 UNITS CPEP capsule Take 1 capsule (36,000 Units total) by mouth 3 (three) times daily with meals. May also take 1 capsule (36,000 Units total) as needed (with snacks). (Patient not taking: Reported on 09/24/2022)   [DISCONTINUED] loperamide (IMODIUM) 2 MG capsule Take 1 capsule (2 mg total) by mouth every 6 (six) hours as needed for diarrhea or  loose stools (upto 4 doses a day). (Patient not taking: Reported on 09/24/2022)   [DISCONTINUED] triamcinolone (KENALOG) 0.025 % cream Apply 1 application. topically 2 (two) times daily. (Patient not taking: Reported on 09/24/2022)   No facility-administered medications prior to visit.    Review of Systems See HPI     Last CBC Lab Results  Component Value Date   WBC 8.1 06/29/2022   HGB 15.2 06/29/2022   HCT 45.1 06/29/2022   MCV 99.8 06/29/2022   MCH 33.6 06/29/2022   RDW 12.7 06/29/2022   PLT 207 06/29/2022   Last metabolic panel Lab Results  Component Value Date   GLUCOSE 105 (H) 09/22/2022   NA 139 09/22/2022   K 4.1 09/22/2022   CL 104 09/22/2022   CO2 22 09/22/2022   BUN 11 09/22/2022   CREATININE 0.87 09/22/2022   GFRNONAA >60 06/29/2022   CALCIUM 8.8 09/22/2022   PHOS 3.6 06/16/2021   PROT 7.2 06/29/2022   ALBUMIN 4.1 06/29/2022   LABGLOB 3.0 12/03/2020   AGRATIO 1.5 08/25/2017   BILITOT 0.4 06/29/2022   ALKPHOS 95 06/29/2022   AST 23 06/29/2022   ALT 25 06/29/2022   ANIONGAP 5 06/29/2022   Last lipids Lab Results  Component Value Date   CHOL 127 10/06/2021   HDL 40 10/06/2021   LDLCALC 62 10/06/2021   TRIG 144 10/06/2021   CHOLHDL 3.2 10/06/2021   Last hemoglobin A1c Lab Results  Component Value Date   HGBA1C  5.7 (A) 03/04/2022   Last thyroid functions Lab Results  Component Value Date   TSH 2.19 09/22/2022       Objective     Today's Vitals   10/19/22 0918  BP: 116/73  Pulse: 68  SpO2: 95%  Weight: 177 lb 12.8 oz (80.6 kg)  Height: 6' (1.829 m)   Body mass index is 24.11 kg/m.  BP Readings from Last 3 Encounters:  10/19/22 116/73  09/24/22 138/70  07/28/22 132/86    Wt Readings from Last 3 Encounters:  10/19/22 177 lb 12.8 oz (80.6 kg)  09/24/22 182 lb 3.2 oz (82.6 kg)  07/28/22 178 lb 12.8 oz (81.1 kg)    Physical Exam Vitals and nursing note reviewed.  Constitutional:      Appearance: Normal appearance. He is  well-developed.  HENT:     Head: Normocephalic and atraumatic.     Nose: Nose normal.     Mouth/Throat:     Mouth: Mucous membranes are moist.     Pharynx: Oropharynx is clear.  Eyes:     Extraocular Movements: Extraocular movements intact.     Conjunctiva/sclera: Conjunctivae normal.     Pupils: Pupils are equal, round, and reactive to light.  Neck:     Vascular: No carotid bruit.  Cardiovascular:     Rate and Rhythm: Normal rate and regular rhythm.     Pulses: Normal pulses.     Heart sounds: Normal heart sounds.  Pulmonary:     Effort: Pulmonary effort is normal.     Breath sounds: Normal breath sounds.  Abdominal:     Palpations: Abdomen is soft.  Musculoskeletal:        General: Normal range of motion.     Cervical back: Normal range of motion and neck supple.  Lymphadenopathy:     Cervical: No cervical adenopathy.  Skin:    General: Skin is warm and dry.     Capillary Refill: Capillary refill takes less than 2 seconds.  Neurological:     General: No focal deficit present.     Mental Status: He is alert and oriented to person, place, and time.  Psychiatric:        Mood and Affect: Mood normal.        Behavior: Behavior normal.        Thought Content: Thought content normal.        Judgment: Judgment normal.       Assessment & Plan    Chronic obstructive pulmonary disease, unspecified COPD type (HCC) Assessment & Plan: PFTs show obstructive and restrictive lung disease with some reversibility Currently on trelegy and stable.  Continue current medication.    Tobacco abuse Assessment & Plan: Current and everyday smoker.  -CT lung cancer screening ordered for further evaluation  -patient not ready to quit smoking at this time.   Orders: -     CT CHEST LUNG CANCER SCREENING LOW DOSE WO CONTRAST; Future  Screening for lung cancer -     CT CHEST LUNG CANCER SCREENING LOW DOSE WO CONTRAST; Future  Impaired fasting glucose Assessment & Plan: Check HgbA1c  today with lab draw at [facility].  Recent glucose 105  Orders: -     Lipid panel; Future -     Hemoglobin A1c; Future  Hyperthyroidism Assessment & Plan: Recent labs show stability  -continue regular visits with endocrinology as scheduled    Malignant melanoma of torso excluding breast [facility]) Assessment & Plan: Continue routine visits at [facility] as  scheduled       Return in about 6 months (around 04/20/2023) for health maintenance exam.         Carlean Jews, NP  [facility] at [facility] [phone] (phone) [phone] (fax)  [facility]

## 2022-10-19 NOTE — Assessment & Plan Note (Signed)
Continue routine visits at [facility] as scheduled

## 2022-10-20 ENCOUNTER — Other Ambulatory Visit: Payer: Self-pay | Admitting: Nurse Practitioner

## 2022-10-20 ENCOUNTER — Telehealth: Payer: Self-pay | Admitting: Hematology

## 2022-10-20 DIAGNOSIS — G629 Polyneuropathy, unspecified: Secondary | ICD-10-CM

## 2022-10-20 LAB — LIPID PANEL W/O CHOL/HDL RATIO
Cholesterol, Total: 128 mg/dL (ref 100–199)
HDL: 54 mg/dL (ref >39)
LDL Chol Calc (NIH): 59 mg/dL (ref 0–99)
Triglycerides: 75 mg/dL (ref 0–149)
VLDL Cholesterol Cal: 15 mg/dL (ref 5–40)

## 2022-10-20 LAB — HGB A1C W/O EAG: Hgb A1c MFr Bld: 5.7 % — ABNORMAL HIGH (ref 4.8–5.6)

## 2022-10-20 MED ORDER — GABAPENTIN 100 MG PO CAPS
ORAL_CAPSULE | ORAL | 2 refills | Status: DC
Start: 2022-10-20 — End: 2023-05-03

## 2022-10-20 NOTE — Telephone Encounter (Signed)
Pt has been notified and voices understanding.  

## 2022-10-26 NOTE — Addendum Note (Signed)
Addended by: Wyvonnia Lora on: 10/26/2022 12:55 AM   Modules accepted: Orders

## 2022-11-01 ENCOUNTER — Telehealth: Payer: Self-pay | Admitting: Hematology

## 2022-11-02 ENCOUNTER — Encounter: Payer: Self-pay | Admitting: Nurse Practitioner

## 2022-11-09 ENCOUNTER — Telehealth: Payer: BC Managed Care – PPO | Admitting: Hematology

## 2022-11-10 ENCOUNTER — Ambulatory Visit (HOSPITAL_COMMUNITY)
Admission: RE | Admit: 2022-11-10 | Discharge: 2022-11-10 | Disposition: A | Discharge status: Home / Self Care | Payer: BC Managed Care – PPO | Source: Ambulatory Visit | Attending: Hematology | Admitting: Hematology

## 2022-11-10 ENCOUNTER — Encounter (HOSPITAL_COMMUNITY): Payer: Self-pay

## 2022-11-10 ENCOUNTER — Ambulatory Visit (HOSPITAL_COMMUNITY): Payer: BC Managed Care – PPO

## 2022-11-10 DIAGNOSIS — N200 Calculus of kidney: Secondary | ICD-10-CM | POA: Diagnosis not present

## 2022-11-10 DIAGNOSIS — C4359 Malignant melanoma of other part of trunk: Secondary | ICD-10-CM | POA: Insufficient documentation

## 2022-11-10 DIAGNOSIS — I700 Atherosclerosis of aorta: Secondary | ICD-10-CM | POA: Diagnosis not present

## 2022-11-10 DIAGNOSIS — C439 Malignant melanoma of skin, unspecified: Secondary | ICD-10-CM | POA: Diagnosis not present

## 2022-11-10 MED ORDER — IOHEXOL 300 MG/ML  SOLN
100.0000 mL | Freq: Once | INTRAMUSCULAR | Status: AC | PRN
  Administered 2022-11-10: 100 mL via INTRAVENOUS

## 2022-11-16 ENCOUNTER — Other Ambulatory Visit: Payer: Self-pay | Admitting: Surgery

## 2022-11-16 ENCOUNTER — Ambulatory Visit
Admission: RE | Admit: 2022-11-16 | Discharge: 2022-11-16 | Disposition: A | Discharge status: Home / Self Care | Payer: BC Managed Care – PPO | Source: Ambulatory Visit | Attending: Nurse Practitioner | Admitting: Nurse Practitioner

## 2022-11-16 DIAGNOSIS — Z122 Encounter for screening for malignant neoplasm of respiratory organs: Secondary | ICD-10-CM

## 2022-11-16 DIAGNOSIS — C4361 Malignant melanoma of right upper limb, including shoulder: Secondary | ICD-10-CM | POA: Diagnosis not present

## 2022-11-16 DIAGNOSIS — F17210 Nicotine dependence, cigarettes, uncomplicated: Secondary | ICD-10-CM | POA: Diagnosis not present

## 2022-11-16 DIAGNOSIS — Z720 Tobacco use: Secondary | ICD-10-CM

## 2022-11-16 DIAGNOSIS — L080 Pyoderma: Secondary | ICD-10-CM | POA: Diagnosis not present

## 2022-11-16 DIAGNOSIS — R229 Localized swelling, mass and lump, unspecified: Secondary | ICD-10-CM | POA: Diagnosis not present

## 2022-11-16 DIAGNOSIS — L309 Dermatitis, unspecified: Secondary | ICD-10-CM | POA: Diagnosis not present

## 2022-11-22 ENCOUNTER — Inpatient Hospital Stay: Payer: BC Managed Care – PPO | Attending: Hematology | Admitting: Hematology

## 2022-11-22 DIAGNOSIS — C4359 Malignant melanoma of other part of trunk: Secondary | ICD-10-CM

## 2022-11-22 NOTE — Progress Notes (Signed)
Benign appearing CT chest lung cancer screening

## 2022-11-22 NOTE — Progress Notes (Signed)
This encounter was created in error - please disregard.  Patient not contactable for phone visit. Called multiple times.  Wife informed of results CT neck-  Narrative & Impression  CLINICAL DATA:  Metastatic melanoma, assess treatment response.   EXAM: CT NECK WITH CONTRAST   TECHNIQUE: Multidetector CT imaging of the neck was performed using the standard protocol following the bolus administration of intravenous contrast.   RADIATION DOSE REDUCTION: This exam was performed according to the departmental dose-optimization program which includes automated exposure control, adjustment of the mA and/or kV according to patient size and/or use of iterative reconstruction technique.   CONTRAST:  OMNIPAQUE IOHEXOL 300 MG/ML  SOLN   COMPARISON:  Head CT 02/22/2022   FINDINGS: PHARYNX AND LARYNX: The nasopharynx, oropharynx and larynx are normal. Visible portions of the oral cavity, tongue base and floor of mouth are normal. Normal epiglottis, vallecula and pyriform sinuses. The larynx is normal. No retropharyngeal abscess, effusion or lymphadenopathy.   SALIVARY GLANDS: Normal parotid, submandibular and sublingual glands.   THYROID: Normal.   LYMPH NODES: At right level 2A, there is a 1.8 cm predominantly cystic mass with a 6 mm enhancing nodule (2:64). There is a similar lesion at right level 3 that measures 2.5 cm with a 2 mm enhancing nodule (2:84). There is a 7 mm right paratracheal node in the upper mediastinum (2:108). These lesions were all present on the PET CT of 02/22/2022.   VASCULAR: Major cervical vessels are patent.   LIMITED INTRACRANIAL: Normal.   VISUALIZED ORBITS: Normal.   MASTOIDS AND VISUALIZED PARANASAL SINUSES: No fluid levels or advanced mucosal thickening. No mastoid effusion.   SKELETON: No bony spinal canal stenosis. No lytic or blastic lesions.   UPPER CHEST: Clear.   OTHER: None.   IMPRESSION: 1. Unchanged appearance of partially  cystic lesions deep to the right sternocleidomastoid muscle, along the right cervical lymphatic chain. 2. Unchanged appearance of 6 mm high right paratracheal node. 3. No new lymphadenopathy.       CT CAP   IMPRESSION: 1. No acute process or evidence of metastatic disease within the chest, abdomen, or pelvis. 2. Similar postsurgical scarring superficial to the right scapula. 3. Incidental findings, including: Scarred right kidney. Left nephrolithiasis. Aortic atherosclerosis (ICD10-I70.0) and emphysema (ICD10-J43.9).

## 2022-12-03 ENCOUNTER — Other Ambulatory Visit: Payer: Self-pay

## 2022-12-03 ENCOUNTER — Telehealth: Payer: Self-pay | Admitting: Hematology

## 2022-12-03 NOTE — Telephone Encounter (Signed)
Left patient a message regarding appointment times/dates

## 2022-12-10 ENCOUNTER — Other Ambulatory Visit: Payer: Self-pay | Admitting: Nurse Practitioner

## 2022-12-10 DIAGNOSIS — J449 Chronic obstructive pulmonary disease, unspecified: Secondary | ICD-10-CM

## 2022-12-24 ENCOUNTER — Encounter: Payer: Self-pay | Admitting: "Endocrinology

## 2022-12-24 ENCOUNTER — Ambulatory Visit (INDEPENDENT_AMBULATORY_CARE_PROVIDER_SITE_OTHER): Payer: BC Managed Care – PPO | Admitting: "Endocrinology

## 2022-12-24 VITALS — BP 115/70 | HR 78 | Ht 72.0 in | Wt 187.6 lb

## 2022-12-24 DIAGNOSIS — E2740 Unspecified adrenocortical insufficiency: Secondary | ICD-10-CM | POA: Diagnosis not present

## 2022-12-24 DIAGNOSIS — R55 Syncope and collapse: Secondary | ICD-10-CM

## 2022-12-24 MED ORDER — BLOOD GLUCOSE MONITORING SUPPL DEVI: 1.0000 | Freq: Three times a day (TID) | 0 refills | Status: AC

## 2022-12-24 MED ORDER — BLOOD GLUCOSE TEST VI STRP: 1.0000 | ORAL_STRIP | Freq: Three times a day (TID) | 3 refills | Status: AC

## 2022-12-24 MED ORDER — LANCETS MISC. MISC: 1.0000 | Freq: Three times a day (TID) | 3 refills | Status: AC

## 2022-12-24 MED ORDER — LANCET DEVICE MISC: 1.0000 | Freq: Three times a day (TID) | 0 refills | Status: AC

## 2022-12-24 NOTE — Progress Notes (Signed)
George Seaforth, MD 12/24/22   Patient ID: George West, male   DOB: March 15, 1961, 62 y.o.   MRN: [id number]    Chief complaint: Followup of adrenal insufficiency  History of Present Illness:    Recent history: Currently taking Cortef 20 mg at 7 am and 10 gm at 2 pm and florinef 0.1 mg half tablet alternate days Has another episode of fully passing out for about 4 min, no with "pulse", relative was EMT and was going to start CPR but he got self revived, was pale, drenched in sweat from head to thighs but did not froth/urinate. BP at hour ago was on lower side of 107 SBP with normal HR. BG was not checked.   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 His main symptom was weight loss, tiredness, weakness and lightheadedness along with passing out starting in January 2023 This was diagnosed by his oncologist Baseline cortisol level was 1.1 and ACTH level was undetectable in 1/23 Although initially was treated with hydrocortisone and continued to have some syncope and Florinef was added but this was stopped subsequently when blood pressure went up  Because of low normal standing blood pressures in 8/23 he was started on Florinef 3 days a week and switched from prednisone to hydrocortisone 20 mg in the morning and 10 mg in the evening  He had stopped Florinef for a second time because of rising blood pressure up to 164 and symptoms of throbbing in the head and eye pain  On his last visit blood pressure readings had been around 100 systolic at home and he was having significant decreased appetite, feeling lightheaded and feeling like passing out This improved significantly after doubling his hydrocortisone for a week Previously when he went back to the regimen of 20 mg hydrocortisone in the morning and 10 in the afternoon he started getting fatigued and drowsy in the early afternoon With this he has started taking an extra  half a tablet of Cortef around 1 PM with improvement in symptoms but felt same in office subsequently  He says he is getting the brand-name Cortef  Recent blood pressure at home apparently about 138/70  C/o thinning skin  04/2021 MRI HEAD WITHOUT AND WITH CONTRAST: Questionable pituitary MRI showed 4 mm nodule 02/2022 MRI HEAD WITHOUT AND WITH CONTRAST: Stable hypoenhancing area on the left side of the pituitary. Thismay represent a pituitary microadenoma    Lab Results  Component Value Date   CRTSLPL 6.5 06/23/2021   CRTSLPL 0.7 06/23/2021   CRTSLPL 1.1 05/22/2021    PITUITARY functions:  At baseline he had the following labs Prolactin: borderline high.   Total testosterone 481 but free testosterone low at 1.6  THYROID:  TSH level was suppressed in 11/22 and free T4 was above normal only transiently in 1/23 at 1.82  He was given methimazole for short time but this was stopped when TSH went up higher at 11  Wt Readings from Last 3 Encounters:  12/24/22 187 lb 9.6 oz (85.1 kg)  10/19/22 179 lb 8 oz (81.4 kg)  10/19/22 177 lb 12.8 oz (80.6 kg)   Lab Results  Component Value Date   TSH 1.930 10/19/2022   TSH 2.19 09/22/2022   TSH 1.479 06/29/2022   FREET4 1.14 09/22/2022   FREET4 0.94 07/28/2022   FREET4 1.16 12/15/2021   Component     Latest Ref Rng 06/23/2021 12/15/2021 07/28/2022  Testosterone     300.00 - 890.00  ng/dL 010  272.53  664.40      No visits with results within 1 Week(s) from this visit.  Latest known visit with results is:  Orders Only on 10/19/2022  Component Date Value Ref Range Status   Cholesterol, Total 10/19/2022 128  100 - 199 mg/dL Final   Triglycerides 34/74/2595 75  0 - 149 mg/dL Final   HDL 63/87/5643 54  >39 mg/dL Final   VLDL Cholesterol Cal 10/19/2022 15  5 - 40 mg/dL Final   LDL Chol Calc (NIH) 10/19/2022 59  0 - 99 mg/dL Final   Hgb P2R MFr Bld 10/19/2022 5.7 (H)  4.8 - 5.6 % Final   Comment:          Prediabetes: 5.7 - 6.4           Diabetes: >6.4          Glycemic control for adults with diabetes: <7.0     Allergies as of 12/24/2022       Reactions   Duloxetine    dizziness        Medication List        Accurate as of December 24, 2022  9:36 AM. If you have any questions, ask your nurse or doctor.          Blood Glucose Monitoring Suppl Devi 1 each by Does not apply route in the morning, at noon, and at bedtime. May substitute to any manufacturer covered by patient's insurance. Started by: George [facility]   BLOOD GLUCOSE TEST STRIPS Strp 1 each by In Vitro route in the morning, at noon, and at bedtime. May substitute to any manufacturer covered by patient's insurance. Started by: George [facility]   fludrocortisone 0.1 MG tablet Commonly known as: FLORINEF Take 1 tablet Monday/Wednesday and Friday   gabapentin 100 MG capsule Commonly known as: Neurontin Take 1 capsule po QD. May increase to 1 capsule twice daily if tolerated well.   hydrocortisone 20 MG tablet Commonly known as: CORTEF Take 20 mg by mouth daily before breakfast.   hydrocortisone 10 MG tablet Commonly known as: CORTEF Take 30 mg by mouth daily.   Lancet Device Misc 1 each by Does not apply route in the morning, at noon, and at bedtime. May substitute to any manufacturer covered by patient's insurance. Started by: George [place]   Lancets Misc. Misc 1 each by Does not apply route in the morning, at noon, and at bedtime. May substitute to any manufacturer covered by patient's insurance. Started by: George [facility]   Saccharomyces boulardii 500 MG Pack Take 500 mg by mouth in the morning and at bedtime.   Trelegy Ellipta 100-62.5-25 MCG/ACT Aepb Generic drug: Fluticasone-Umeclidin-Vilant INHALE 1 PUFF ONCE DAILY        Allergies:  Allergies  Allergen Reactions   Duloxetine     dizziness    Past Medical History:  Diagnosis Date   Adrenal insufficiency (HCC)    COPD (chronic obstructive pulmonary disease) (HCC)     Depression    Dyspnea    with 25ft of ambulation. 200 feet if he walks slow.   Fatty liver    History of kidney stones    seen on CT Scan   Skin cancer (melanoma) (HCC)    Thyroid disease     Past Surgical History:  Procedure Laterality Date   MASS EXCISION Right 12/15/2020   Procedure: EXCISION OF MALIGNANT MASS RIGHT POSTERIOR SHOULDER;  Surgeon: Almond Lint, MD;  Location: [facility] OR;  Service: General;  Laterality: Right;   WISDOM TOOTH EXTRACTION      Family History  Problem Relation Age of Onset   Stroke Mother    Dementia Mother    Breast cancer Sister    Thyroid cancer Sister    Ovarian cancer Sister    Stroke Maternal Grandmother    Breast cancer Paternal Grandmother    Diabetes Paternal Aunt    Diabetes Paternal Uncle    Hypertension Daughter    Other Daughter        Uterine cysts   Thyroid disease Neg Hx     Social History:  reports that he has been smoking cigarettes. He started smoking about 41 years ago. He has a 20.8 pack-year smoking history. He has never used smokeless tobacco. He reports current alcohol use of about 6.0 standard drinks of alcohol per week. He reports that he does not use drugs.  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 Currently taking Cortef 20 mg at 7 am and 10 gm at 2 pm and florinef 0.1 mg half tablet alternate days Discussed patient the side effects of excessive hydrocortisone, patient agrees and feels that he has been dealing with the same including behavioral issues  as well as skin thinning Patient finds no difference between brand Cortef and generic hydrocortisone Instructed to take 20 mg at 7 in the morning and 10 mg at 3 PM Instructed to continue checking blood pressure at home, if blood pressure drops, try half a pill of 0.1 mg Florinef as tolerated Reports already following with cardiac etiology and neurology in the past.   2024 testosterone and free T4, free T3, TSH WNL Episodes unrelated to adrenal insufficiency as pt is adequately dosed with steroids and florinef   Recommend incorporating water and protein drinks, avoid red meats/plain sugars/sugary beverages   Check orthostatic BP and HR when he feels episodes coming as well as checking BG Doubling up steroid dose of days of "impending episode"  Recommend second cardiology opinion   Return in about 3 months (around 03/26/2023).     12/24/2022, 9:36 AM

## 2023-01-27 IMAGING — PT NM PET TUM IMG INITIAL (PI) WHOLE BODY
7 series · 25 of 25 positions shown · non-contrast
Comparison: Low-dose chest CT 06/23/2020. Images from right
shoulder biopsy 11/19/2020.

CLINICAL DATA: Initial treatment strategy for metastatic melanoma
diagnosed on biopsy of right shoulder mass 2 weeks prior. Plasma
cell dyscrasia.

EXAM:
NUCLEAR MEDICINE PET WHOLE BODY
TECHNIQUE: 8.7 mCi F-18 FDG was injected intravenously. Full-ring PET imaging
was performed from the head to foot after the radiotracer. CT data
was obtained and used for attenuation correction and anatomic
localization.
Fasting blood glucose: 114 mg/dl

[Series 3: pet wb ac · axial · 5.0mm · 4.07mm/px · z∈[-150,+1686]mm · 5 of 460 slices shown]
[im 1/460  full-range]
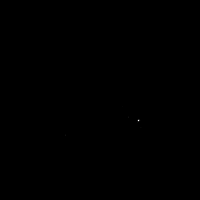
[im 115/460]
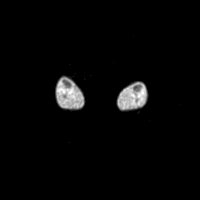
[im 230/460]
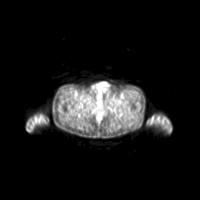
[im 345/460]
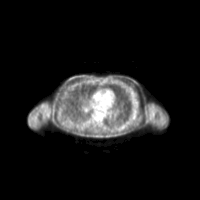
[im 460/460]
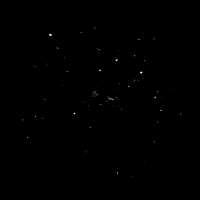

[Series 4: ct wb 5.0 hd_fov · axial · 5.0mm · 1.07mm/px · z∈[-150,+1686]mm · 6 of 460 slices shown]
[im 1/460  full-range]
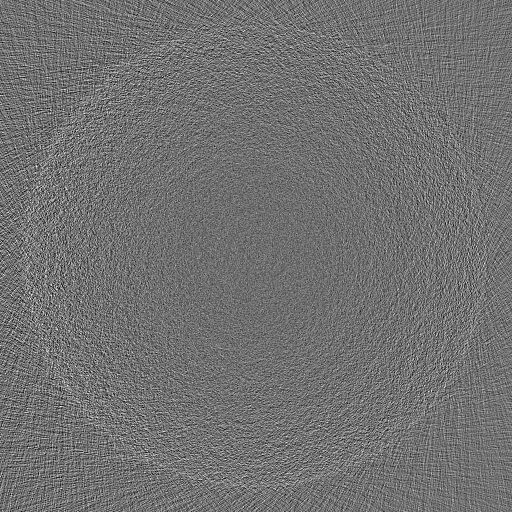
[im 92/460  soft-tissue]
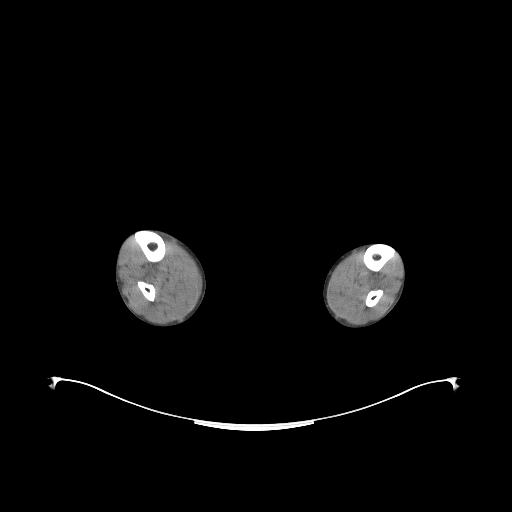
[im 184/460  soft-tissue]
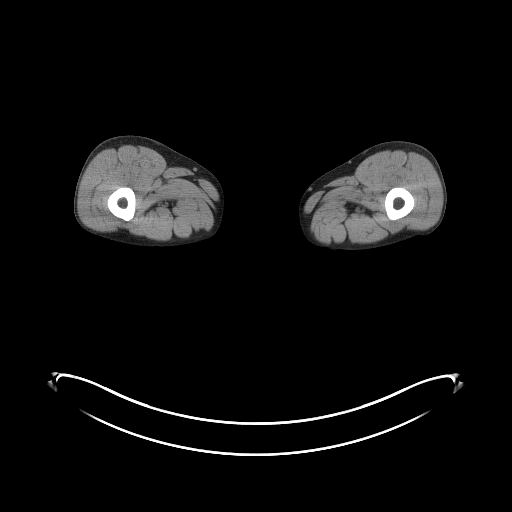
[im 276/460  soft-tissue]
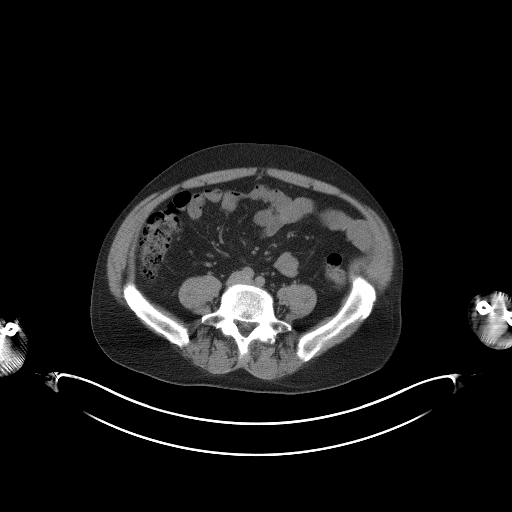
[im 368/460  soft-tissue]
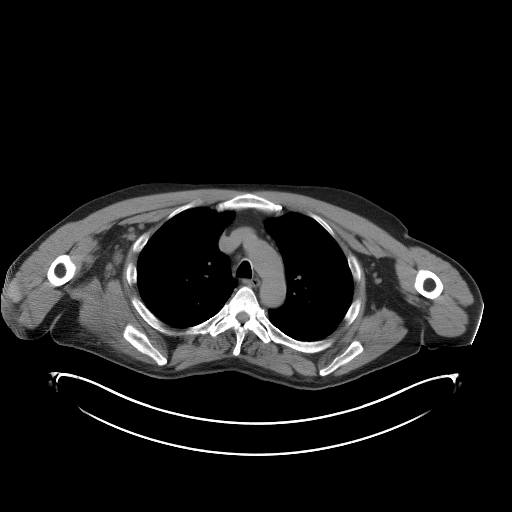
[im 460/460  soft-tissue]
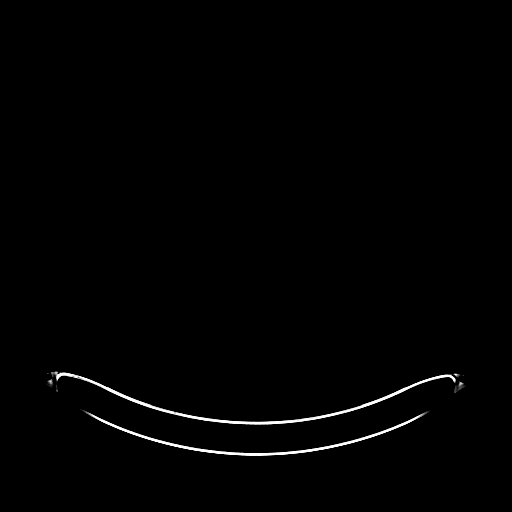

[Series 5: pet wb nac · axial · 5.0mm · 4.07mm/px · z∈[-150,+1686]mm · 6 of 460 slices shown]
[im 1/460]
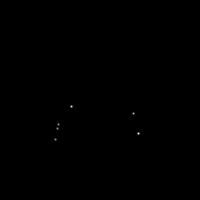
[im 92/460]
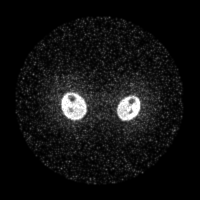
[im 184/460]
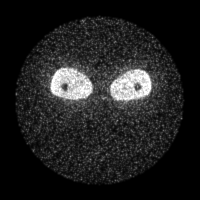
[im 276/460]
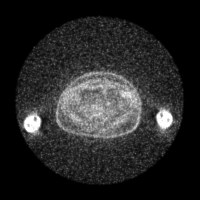
[im 368/460]
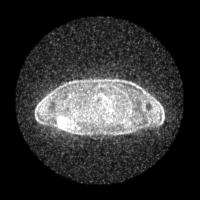
[im 460/460]
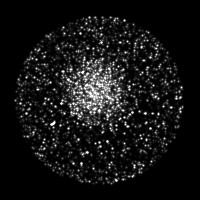

[Series 8: ct wb 5.0 br59 (id)_bone · axial · 5.0mm · 0.79mm/px · 1 of 86 slices shown]
[im 1/86  soft-tissue]
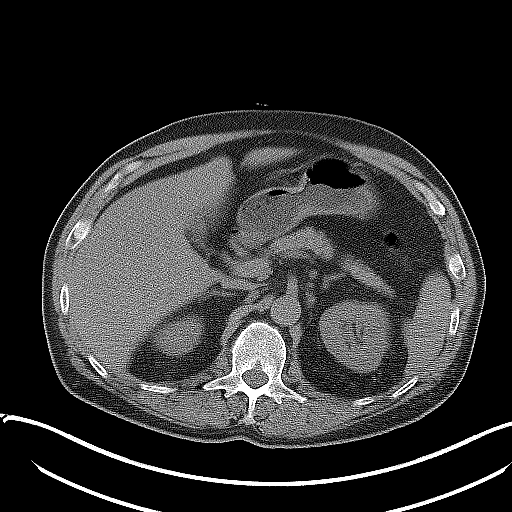

[Series 603: fused cor · 1 of 36 slices shown]
[im 1/36]
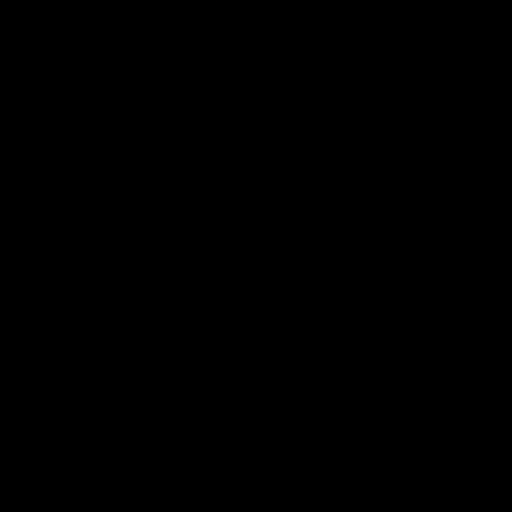

[Series 604: <mip collection> · coronal · 3.80mm/px · 1 of 32 slices shown]
[im 1/32]
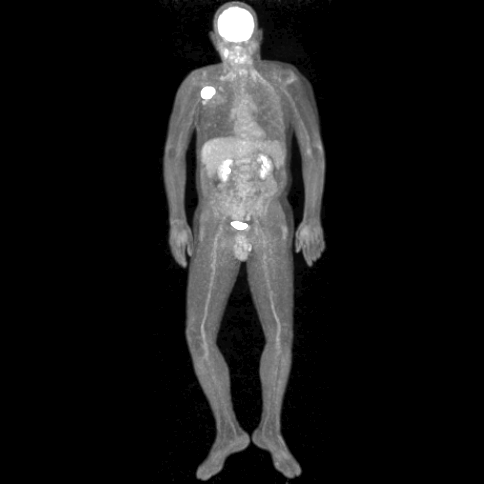

[Series 605: range-ct wb 5.0 hd_fov-tra-<alpha range> · 5 of 428 slices shown]
[im 1/428]
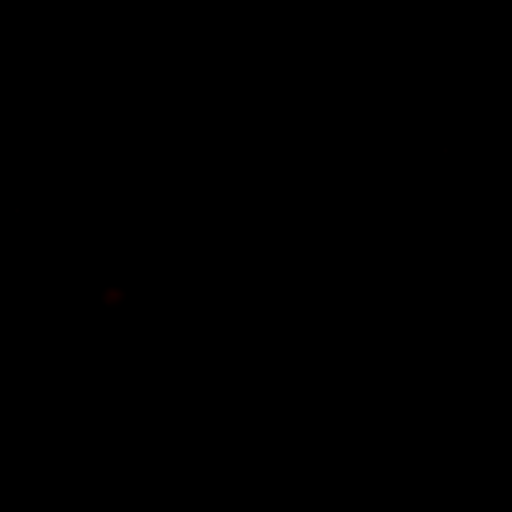
[im 107/428]
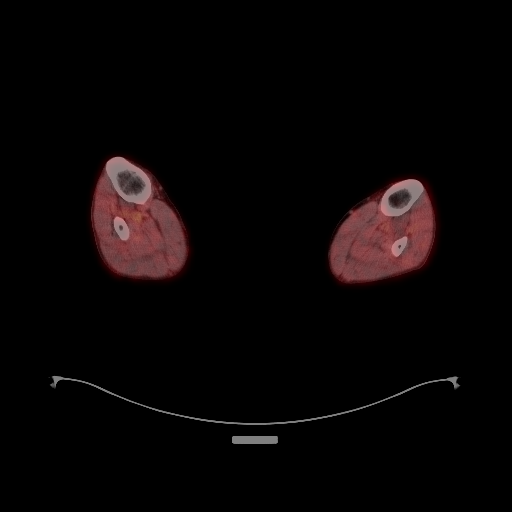
[im 214/428]
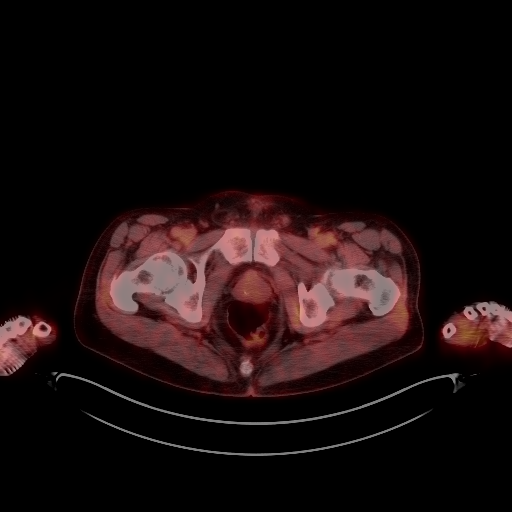
[im 321/428]
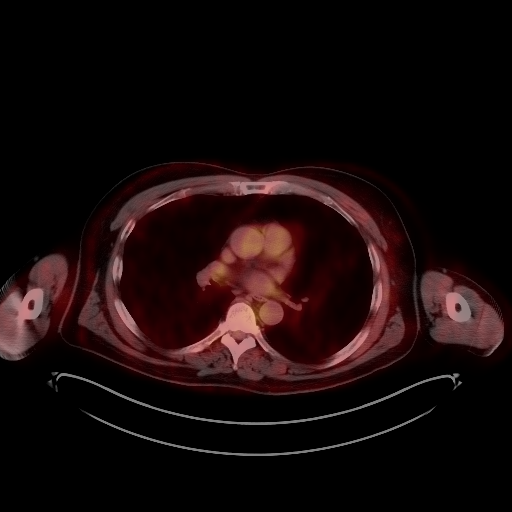
[im 428/428]
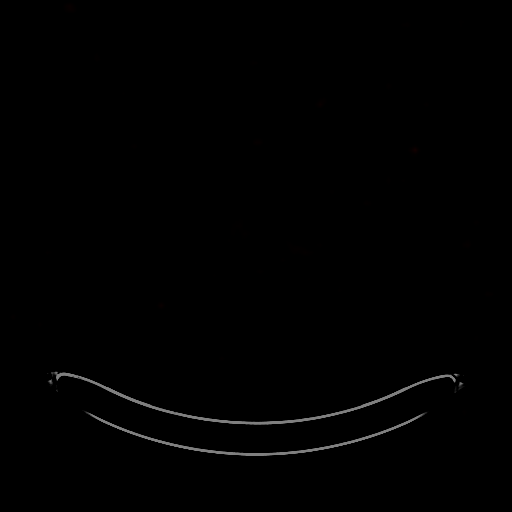

[25 of 25 positions shown; findings below may reference images not displayed]

FINDINGS: Mediastinal blood pool activity: SUV max

HEAD/ NECK:

There is some misregistration between the PET and CT images.
Multiple mildly enlarged right cervical lymph nodes are noted
without definite associated hypermetabolic activity. Largest node is
a level 4 node on the right, measuring 2.0 cm short axis on image
74/4. No hypermetabolic cervical lymph nodes are identified.
Asymmetric activity along the right maxilla is likely related to
periodontal disease. There is also mild asymmetric activity
laterally in the left oropharynx without clear corresponding CT
finding (SUV max 4.6).There are no definite lesions of the
pharyngeal mucosal space.

Incidental CT findings: none

CHEST:

There are multiple enlarged right axillary and subpectoral lymph
nodes. These appear slightly more prominent than on the prior CT
from [place] and include a 1.3 cm short axis subpectoral node on
image 85/4 and a 1.3 cm right axillary node on image 95/4. These
nodes appear only mildly hypermetabolic within SUV max of 3.7. These
nodes are in close proximity to a large hypermetabolic mass
posterior to the right scapula which measures 5.2 x 4.0 cm on image
85/4. This mass demonstrates significant hypermetabolic activity
with an SUV max of 22.4. No hypermetabolic mediastinal or hilar
lymph nodes. There is no hypermetabolic pulmonary activity or
suspicious pulmonary nodularity.

Incidental CT findings: Mild centrilobular emphysema.

ABDOMEN/PELVIS:

There is no hypermetabolic activity within the liver, adrenal
glands, spleen or pancreas. There is no hypermetabolic nodal
activity.

Incidental CT findings: Hepatic steatosis. Tiny nonobstructing
calculi in the lower pole of the left kidney. Aortic and branch
vessel atherosclerosis.

SKELETON:

There is no hypermetabolic activity to suggest osseous metastatic
disease.

Incidental CT findings: none

EXTREMITIES: No suspicious metabolic activity identified within the
extremities. Specifically, no cutaneous or subcutaneous lesion
identified within the right upper extremity aside from the large
hypermetabolic mass posterior to the right scapula. No
hypermetabolic osseous activity or lytic lesions identified.

Incidental CT findings: none
IMPRESSION: 1. Large hypermetabolic soft tissue mass posterior to the right
scapula consistent with known metastatic melanoma.
2. Numerous enlarged right axillary, subpectoral and right cervical
lymph nodes are only mildly hypermetabolic, although appear
progressive from previous chest CT and are suspicious for nodal
metastases given the right shoulder soft tissue mass.
3. No distant metastases or primary cutaneous lesion identified.
4. No suspicious osseous findings.
5. Incidental findings as above, including hepatic steatosis,
nonobstructing left renal calculi and aortic atherosclerosis.

## 2023-02-14 DIAGNOSIS — C44212 Basal cell carcinoma of skin of right ear and external auricular canal: Secondary | ICD-10-CM | POA: Diagnosis not present

## 2023-02-14 DIAGNOSIS — L820 Inflamed seborrheic keratosis: Secondary | ICD-10-CM | POA: Diagnosis not present

## 2023-02-14 DIAGNOSIS — D485 Neoplasm of uncertain behavior of skin: Secondary | ICD-10-CM | POA: Diagnosis not present

## 2023-02-14 DIAGNOSIS — D692 Other nonthrombocytopenic purpura: Secondary | ICD-10-CM | POA: Diagnosis not present

## 2023-02-14 DIAGNOSIS — D224 Melanocytic nevi of scalp and neck: Secondary | ICD-10-CM | POA: Diagnosis not present

## 2023-02-14 DIAGNOSIS — Z85820 Personal history of malignant melanoma of skin: Secondary | ICD-10-CM | POA: Diagnosis not present

## 2023-02-14 DIAGNOSIS — D2262 Melanocytic nevi of left upper limb, including shoulder: Secondary | ICD-10-CM | POA: Diagnosis not present

## 2023-02-14 DIAGNOSIS — D2239 Melanocytic nevi of other parts of face: Secondary | ICD-10-CM | POA: Diagnosis not present

## 2023-03-17 ENCOUNTER — Other Ambulatory Visit: Payer: Self-pay

## 2023-03-17 DIAGNOSIS — C4359 Malignant melanoma of other part of trunk: Secondary | ICD-10-CM

## 2023-03-21 ENCOUNTER — Inpatient Hospital Stay (HOSPITAL_BASED_OUTPATIENT_CLINIC_OR_DEPARTMENT_OTHER): Payer: BC Managed Care – PPO | Admitting: Hematology

## 2023-03-21 ENCOUNTER — Inpatient Hospital Stay: Payer: BC Managed Care – PPO | Attending: Hematology

## 2023-03-21 DIAGNOSIS — F17210 Nicotine dependence, cigarettes, uncomplicated: Secondary | ICD-10-CM | POA: Diagnosis not present

## 2023-03-21 DIAGNOSIS — C4360 Malignant melanoma of unspecified upper limb, including shoulder: Secondary | ICD-10-CM | POA: Insufficient documentation

## 2023-03-21 DIAGNOSIS — C4359 Malignant melanoma of other part of trunk: Secondary | ICD-10-CM

## 2023-03-21 DIAGNOSIS — D472 Monoclonal gammopathy: Secondary | ICD-10-CM | POA: Insufficient documentation

## 2023-03-21 DIAGNOSIS — Z803 Family history of malignant neoplasm of breast: Secondary | ICD-10-CM | POA: Diagnosis not present

## 2023-03-21 LAB — CMP (CANCER CENTER ONLY)
ALT: 20 U/L (ref 0–44)
AST: 24 U/L (ref 15–41)
Albumin: 4.2 g/dL (ref 3.5–5.0)
Alkaline Phosphatase: 102 U/L (ref 38–126)
Anion gap: 7 (ref 5–15)
BUN: 9 mg/dL (ref 8–23)
CO2: 24 mmol/L (ref 22–32)
Calcium: 9 mg/dL (ref 8.9–10.3)
Chloride: 105 mmol/L (ref 98–111)
Creatinine: 0.99 mg/dL (ref 0.61–1.24)
GFR, Estimated: >60 mL/min (ref >60)
Glucose, Bld: 112 mg/dL — ABNORMAL HIGH (ref 70–99)
Potassium: 4.2 mmol/L (ref 3.5–5.1)
Sodium: 136 mmol/L (ref 135–145)
Total Bilirubin: 0.9 mg/dL (ref <1.2)
Total Protein: 7.4 g/dL (ref 6.5–8.1)

## 2023-03-21 LAB — CBC WITH DIFFERENTIAL (CANCER CENTER ONLY)
Abs Immature Granulocytes: 0.02 10*3/uL (ref 0.00–0.07)
Basophils Absolute: 0 10*3/uL (ref 0.0–0.1)
Basophils Relative: 0 %
Eosinophils Absolute: 0 10*3/uL (ref 0.0–0.5)
Eosinophils Relative: 1 %
HCT: 46 % (ref 39.0–52.0)
Hemoglobin: 16.2 g/dL (ref 13.0–17.0)
Immature Granulocytes: 0 %
Lymphocytes Relative: 20 %
Lymphs Abs: 1.7 10*3/uL (ref 0.7–4.0)
MCH: 34.5 pg — ABNORMAL HIGH (ref 26.0–34.0)
MCHC: 35.2 g/dL (ref 30.0–36.0)
MCV: 98.1 fL (ref 80.0–100.0)
Monocytes Absolute: 0.5 10*3/uL (ref 0.1–1.0)
Monocytes Relative: 6 %
Neutro Abs: 6.2 10*3/uL (ref 1.7–7.7)
Neutrophils Relative %: 73 %
Platelet Count: 201 10*3/uL (ref 150–400)
RBC: 4.69 MIL/uL (ref 4.22–5.81)
RDW: 12.4 % (ref 11.5–15.5)
WBC Count: 8.6 10*3/uL (ref 4.0–10.5)
nRBC: 0 % (ref 0.0–0.2)

## 2023-03-21 LAB — TSH: TSH: 1.247 u[IU]/mL (ref 0.350–4.500)

## 2023-03-21 NOTE — Progress Notes (Signed)
HEMATOLOGY/ONCOLOGY CLINIC NOTE  Date of Service: 03/21/23   Patient Care Team: Carlean Jews, NP as PCP - General (Family Medicine) Nahser, Deloris Ping, MD as PCP - Cardiology (Cardiology)  CHIEF COMPLAINTS/PURPOSE OF CONSULTATION:  Follow-up for continued management of metastatic melanoma  HISTORY OF PRESENTING ILLNESS:  Previous notes for details of initial presentation  INTERVAL HISTORY  George West is a 62 year old male is here for continued evaluation and management of metastatic melanoma.   Patient was last seen by me on 10/19/2022 and he complained of fatigue, bilateral leg neuropathy, mild blistering sunburns, and right shoulder soreness.   Patient is accompanied by his wife during this visit. Patient notes he has been doing well overall since our last visit. He denies any episodes of passing out in the past six months. Patient complains of persistent right mammary papilla soreness, which started around 2 months ago. He denies chest pain or chest soreness. Patient started developing his soreness after his last mass excision on his back.   Patient has discontinued Gabapentin around 4-5 months ago. He is on the same dose of hydrocortisone.   He regularly follows-up with his Dermatologist and notes he recently had basal cell carcinoma surgically removed from his right ear.   Patient's wife notes that the patient passed out around 5-6 months ago for five to six minutes. He was unresponsive for 5-6 minutes and then he suddenly woke up with drenching sweat.  He denied any new infection issues, fever, chills, night sweats, unexpected weight loss, chest pain, back pain, or leg swelling. He does complain of enlarged lymph node near his right neck, which he noticed yesterday. He also complains of occasional SOB and occasional bilateral leg swelling.   MEDICAL HISTORY:  Past Medical History:  Diagnosis Date   Adrenal insufficiency (HCC)    COPD (chronic obstructive  pulmonary disease) (HCC)    Depression    Dyspnea    with 29ft of ambulation. 200 feet if he walks slow.   Fatty liver    History of kidney stones    seen on CT Scan   Skin cancer (melanoma) (HCC)    Thyroid disease     SURGICAL HISTORY: Past Surgical History:  Procedure Laterality Date   MASS EXCISION Right 12/15/2020   Procedure: EXCISION OF MALIGNANT MASS RIGHT POSTERIOR SHOULDER;  Surgeon: Almond Lint, MD;  Location: [facility] OR;  Service: General;  Laterality: Right;   WISDOM TOOTH EXTRACTION      SOCIAL HISTORY: Social History   Socioeconomic History   Marital status: Married    Spouse name: Elease Hashimoto   Number of children: 2   Years of education: Not on file   Highest education level: Not on file  Occupational History   Occupation: Public librarian    Comment: [name]  Tobacco Use   Smoking status: Every Day    Current packs/day: 0.50    Average packs/day: 0.5 packs/day for 41.8 years (20.9 ttl pk-yrs)    Types: Cigarettes    Start date: 05/17/1981   Smokeless tobacco: Never  Vaping Use   Vaping status: Never Used  Substance and Sexual Activity   Alcohol use: Yes    Alcohol/week: 6.0 standard drinks of alcohol    Types: 4 Cans of beer, 2 Standard drinks or equivalent per week    Comment: 4 per day   Drug use: No   Sexual activity: Yes    Partners: Female    Birth control/protection: None  Other  Topics Concern   Not on file  Social History Narrative   Married   2 children--- 1 daughter/1 son   6 step children   Social Determinants of Corporate investment banker Strain: Not on file  Food Insecurity: Not on file  Transportation Needs: Not on file  Physical Activity: Not on file  Stress: Not on file  Social Connections: Not on file  Intimate Partner Violence: Not on file    FAMILY HISTORY: Family History  Problem Relation Age of Onset   Stroke Mother    Dementia Mother    Breast cancer Sister    Thyroid cancer Sister    Ovarian  cancer Sister    Stroke Maternal Grandmother    Breast cancer Paternal Grandmother    Diabetes Paternal Aunt    Diabetes Paternal Uncle    Hypertension Daughter    Other Daughter        Uterine cysts   Thyroid disease Neg Hx     ALLERGIES:  is allergic to duloxetine.  MEDICATIONS:  Current Outpatient Medications  Medication Sig Dispense Refill   Blood Glucose Monitoring Suppl DEVI 1 each by Does not apply route in the morning, at noon, and at bedtime. May substitute to any manufacturer covered by patient's insurance. 1 each 0   fludrocortisone (FLORINEF) 0.1 MG tablet Take 1 tablet Monday/Wednesday and Friday 15 tablet 2   gabapentin (NEURONTIN) 100 MG capsule Take 1 capsule po QD. May increase to 1 capsule twice daily if tolerated well. 60 capsule 2   Glucose Blood (BLOOD GLUCOSE TEST STRIPS) STRP 1 each by In Vitro route in the morning, at noon, and at bedtime. May substitute to any manufacturer covered by patient's insurance. 100 each 3   hydrocortisone (CORTEF) 10 MG tablet Take 30 mg by mouth daily.     hydrocortisone (CORTEF) 20 MG tablet Take 20 mg by mouth daily before breakfast.     Saccharomyces boulardii 500 MG PACK Take 500 mg by mouth in the morning and at bedtime. (Patient not taking: Reported on 09/24/2022) 20 each 0   TRELEGY ELLIPTA 100-62.5-25 MCG/ACT AEPB INHALE 1 PUFF ONCE DAILY 60 each 0   No current facility-administered medications for this visit.    REVIEW OF SYSTEMS:   .10 Point review of Systems was done is negative except as noted above.  PHYSICAL EXAMINATION: VSS. GENERAL:alert, in no acute distress and comfortable SKIN: no acute rashes, no significant lesions EYES: conjunctiva are pink and non-injected, sclera anicteric OROPHARYNX: MMM, no exudates, no oropharyngeal erythema or ulceration NECK: supple, no JVD LYMPH:  no palpable lymphadenopathy in the cervical, axillary or inguinal regions LUNGS: clear to auscultation b/l with normal respiratory  effort HEART: regular rate & rhythm ABDOMEN:  normoactive bowel sounds , non tender, not distended. Extremity: no pedal edema PSYCH: alert & oriented x 3 with fluent speech NEURO: no focal motor/sensory deficits   LABORATORY DATA:  I have reviewed the data as listed  .    Latest Ref Rng & Units 03/21/2023   12:50 PM 10/19/2022   11:38 AM 06/29/2022   11:52 AM  CBC  WBC 4.0 - 10.5 K/uL 8.6  8.8  8.1   Hemoglobin 13.0 - 17.0 g/dL 10.9  32.3  55.7   Hematocrit 39.0 - 52.0 % 46.0  46.6  45.1   Platelets 150 - 400 K/uL 201  224  207    .    Latest Ref Rng & Units 03/21/2023   12:50 PM 10/19/2022  11:38 AM 09/22/2022    8:22 AM  CMP  Glucose 70 - 99 mg/dL 161  096  045   BUN 8 - 23 mg/dL 9  11  11    Creatinine 0.61 - 1.24 mg/dL 4.09  8.11  9.14   Sodium 135 - 145 mmol/L 136  138  139   Potassium 3.5 - 5.1 mmol/L 4.2  4.0  4.1   Chloride 98 - 111 mmol/L 105  104  104   CO2 22 - 32 mmol/L 24  28  22    Calcium 8.9 - 10.3 mg/dL 9.0  9.4  8.8   Total Protein 6.5 - 8.1 g/dL 7.4  7.6    Total Bilirubin <1.2 mg/dL 0.9  0.8    Alkaline Phos 38 - 126 U/L 102  115    AST 15 - 41 U/L 24  25    ALT 0 - 44 U/L 20  23     . Lab Results  Component Value Date   LDH 144 06/16/2020    SURGICAL PATHOLOGY  CASE: [id number]  PATIENT: [facility]  Surgical Pathology Report   Clinical History: right posterior shoulder soft tissue mass (cm)   FINAL MICROSCOPIC DIAGNOSIS:   A. SOFT TISSUE MASS, RIGHT POSTERIOR SHOULDER, NEEDLE CORE BIOPSY:  -  Malignant melanoma  -  See comment   COMMENT:   By immunohistochemistry, the neoplastic cells are positive for S100,  HMB45, Sox 10 and Melan-A (patchy) but negative for CD45, CD138,  cytokeratin 7, cytokeratin 20, cytokeratin AE1/3, desmin and EMA.  The  morphology and immunophenotype are consistent with malignant melanoma.  Dr. Candise Che was notified of these results on November 24, 2020.  Dr. Luisa Hart  reviewed the case and agrees with the above  diagnosis.    RADIOGRAPHIC STUDIES: I have personally reviewed the radiological images as listed and agreed with the findings in the report. No results found.   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  Unclear primary site with the patient presented with a metastatic deposit in the soft tissue/skin over the right posterior shoulder. Patient also has multiple slightly hypermetabolic lymph nodes-right axillary, subpectoral and right cervical.  These could be from metastatic melanoma or a possible indolent lymphoma related to his IgM kappa monoclonal paraproteinemia. 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  GI panel and C. difficile testing negative.  Colonoscopy with biopsy rule out any colitis or microscopic colitis.  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 Leonides Schanz from GI.  PLAN: -Discussed lab results from today, 03/21/2023, in detail with the patient. CBC stable. CMP stable -Discussed with the patient that his passing out episodes is most likely due to hypotension.  -Continue to follow-up with his Surgeon, dermatologist, and Endocrinologist.  -Discussed with the patient that he has a benign cyst near his right neck according to his CT neck results from 11/10/2022 and PET scan results from October 2023.  -Discussed the option of PET scan results before our next visit. Patient agrees.  -Schedule patient for PET scan before our next visit.  -Discussed with the patient that testosterone level could cause fatigue. However,  testosterone is not a limiting factor as his testosterone level was in the normal range at 374 in March 2024.  -Continue to follow-up with endocrinologist.  -Answered all of patient's questions.   FOLLOW UP: CT neck/CAP in 12 weeks RTC with Dr Candise Che with labs in 14 weeks   The total time spent in the appointment was 30 minutes* .  All of the patient's questions were answered with apparent satisfaction. The patient knows to call the clinic with any problems, questions or concerns.   Wyvonnia Lora MD MS AAHIVMS [facility] Hematology/Oncology Physician [facility]  .*Total Encounter Time as defined by the Centers for Medicare and Medicaid Services includes, in addition to the face-to-face time of a patient visit (documented in the note above) non-face-to-face time: obtaining and reviewing outside history, ordering and reviewing medications, tests or procedures, care coordination (communications with other health care professionals or caregivers) and documentation in the medical record.   I,Param Shah,acting as a Neurosurgeon for Wyvonnia Lora, MD.,have documented all relevant documentation on the behalf of Wyvonnia Lora, MD,as directed by  Wyvonnia Lora, MD while in the presence of Wyvonnia Lora, MD.  .I have reviewed the above documentation for accuracy and completeness, and I agree with the above. Johney Maine MD

## 2023-03-22 ENCOUNTER — Other Ambulatory Visit: Payer: Self-pay | Admitting: Family Medicine

## 2023-03-22 DIAGNOSIS — J449 Chronic obstructive pulmonary disease, unspecified: Secondary | ICD-10-CM

## 2023-03-23 ENCOUNTER — Telehealth: Payer: Self-pay | Admitting: Hematology

## 2023-03-23 NOTE — Telephone Encounter (Signed)
Patient's spouse and patient are aware of scheduled appointment times/dates for follow up

## 2023-03-28 ENCOUNTER — Encounter: Payer: Self-pay | Admitting: "Endocrinology

## 2023-03-28 ENCOUNTER — Ambulatory Visit (INDEPENDENT_AMBULATORY_CARE_PROVIDER_SITE_OTHER): Payer: BC Managed Care – PPO | Admitting: "Endocrinology

## 2023-03-28 VITALS — BP 122/80 | HR 77 | Ht 72.0 in | Wt 187.0 lb

## 2023-03-28 DIAGNOSIS — E2740 Unspecified adrenocortical insufficiency: Secondary | ICD-10-CM | POA: Diagnosis not present

## 2023-03-28 DIAGNOSIS — D352 Benign neoplasm of pituitary gland: Secondary | ICD-10-CM

## 2023-03-28 DIAGNOSIS — C44212 Basal cell carcinoma of skin of right ear and external auricular canal: Secondary | ICD-10-CM | POA: Diagnosis not present

## 2023-03-28 NOTE — Progress Notes (Signed)
George Independence, MD 03/28/23   Patient ID: George West, male   DOB: 1961/02/14, 62 y.o.   MRN: [id number]    Chief complaint: Followup of adrenal insufficiency  History of Present Illness:    Recent history: Currently taking Cortef 10 mg at 7 am, 10 mg at 10 am and 10 mg at 2 pm  Patient has a tendency to change doses or dose timings or stops taking medication to feel "the kick" on resuming the steroid pills as he thinks he has developed tolerance to steroids  Self stopped florinef 0.1 mg half tablet alternate days - due to dizziness Previously had another episode of fully passing out for about 4 min, no with "pulse", relative was EMT and was going to start CPR but he got self revived, was pale, drenched in sweat from head to thighs but did not froth/urinate. BP at hour ago was on lower side of 107 SBP with normal HR. BG was not checked.  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 His main symptom was weight loss, tiredness, weakness and lightheadedness along with passing out starting in January 2023 This was diagnosed by his oncologist Baseline cortisol level was 1.1 and ACTH level was undetectable in 1/23 Although initially was treated with hydrocortisone and continued to have some syncope and Florinef was added but this was stopped subsequently when blood pressure went up  Because of low normal standing blood pressures in 8/23 he was started on Florinef 3 days a week and switched from prednisone to hydrocortisone 20 mg in the morning and 10 mg in the evening  He had stopped Florinef for a second time because of rising blood pressure up to 164 and symptoms of throbbing in the head and eye pain  On his last visit blood pressure readings had been around 100 systolic at home and he was having significant decreased appetite, feeling lightheaded and feeling like passing out This improved significantly after doubling  his hydrocortisone for a week Previously when he went back to the regimen of 20 mg hydrocortisone in the morning and 10 in the afternoon he started getting fatigued and drowsy in the early afternoon With this he has started taking an extra half a tablet of Cortef around 1 PM with improvement in symptoms but felt same in office subsequently  He says he is getting the brand-name Cortef  Recent blood pressure at home apparently about 138/70  C/o thinning skin  04/2021 MRI HEAD WITHOUT AND WITH CONTRAST: Questionable pituitary MRI showed 4 mm nodule 02/2022 MRI HEAD WITHOUT AND WITH CONTRAST: Stable hypoenhancing area on the left side of the pituitary. This may represent a pituitary microadenoma    Lab Results  Component Value Date   CRTSLPL 6.5 06/23/2021   CRTSLPL 0.7 06/23/2021   CRTSLPL 1.1 05/22/2021    PITUITARY functions:  At baseline he had the following labs Prolactin: borderline high.   Total testosterone WNL  THYROID:  TSH level was suppressed in 11/22 and free T4 was above normal only transiently in 1/23 at 1.82        He was given methimazole for short time but this was stopped when TSH went up higher at 11  Wt Readings from Last 3 Encounters:  03/28/23 187 lb (84.8 kg)  12/24/22 187 lb 9.6 oz (85.1 kg)  10/19/22 179 lb 8 oz (81.4 kg)   Lab Results  Component Value Date   TSH 1.247 03/21/2023   TSH 1.930  10/19/2022   TSH 2.19 09/22/2022   FREET4 1.14 09/22/2022   FREET4 0.94 07/28/2022   FREET4 1.16 12/15/2021   Component     Latest Ref Rng 06/23/2021 12/15/2021 07/28/2022  Testosterone     300.00 - 890.00 ng/dL 161  096.04  540.98      No visits with results within 1 Week(s) from this visit.  Latest known visit with results is:  Appointment on 03/21/2023  Component Date Value Ref Range Status   TSH 03/21/2023 1.247  0.350 - 4.500 uIU/mL Final   Comment: Performed by a 3rd Generation assay with a functional sensitivity of <=0.01 uIU/mL. Performed at [facility], [address] [place], [place] [address]    Sodium 03/21/2023 136  135 - 145 mmol/L Final   Potassium 03/21/2023 4.2  3.5 - 5.1 mmol/L Final   Chloride 03/21/2023 105  98 - 111 mmol/L Final   CO2 03/21/2023 24  22 - 32 mmol/L Final   Glucose, Bld 03/21/2023 112 (H)  70 - 99 mg/dL Final   Glucose reference range applies only to samples taken after fasting for at least 8 hours.   BUN 03/21/2023 9  8 - 23 mg/dL Final   Creatinine 78/29/5621 0.99  0.61 - 1.24 mg/dL Final   Calcium 30/86/5784 9.0  8.9 - 10.3 mg/dL Final   Total Protein 69/62/9528 7.4  6.5 - 8.1 g/dL Final   Albumin 41/32/4401 4.2  3.5 - 5.0 g/dL Final   AST 02/72/5366 24  15 - 41 U/L Final   ALT 03/21/2023 20  0 - 44 U/L Final   Alkaline Phosphatase 03/21/2023 102  38 - 126 U/L Final   Total Bilirubin 03/21/2023 0.9  <1.2 mg/dL Final   GFR, Estimated 03/21/2023 >60  >60 mL/min Final   Comment: (NOTE) Calculated using the CKD-EPI Creatinine Equation (2021)    Anion gap 03/21/2023 7  5 - 15 Final   Performed at [facility], [address] [place], [place] [address]   WBC Count 03/21/2023 8.6  4.0 - 10.5 K/uL Final   RBC 03/21/2023 4.69  4.22 - 5.81 MIL/uL Final   Hemoglobin 03/21/2023 16.2  13.0 - 17.0 g/dL Final   HCT 74/25/9563 46.0  39.0 - 52.0 % Final   MCV 03/21/2023 98.1  80.0 - 100.0 fL Final   MCH 03/21/2023 34.5 (H)  26.0 - 34.0 pg Final   MCHC 03/21/2023 35.2  30.0 - 36.0 g/dL Final   RDW 87/56/4332 12.4  11.5 - 15.5 % Final   Platelet Count 03/21/2023 201  150 - 400 K/uL Final   nRBC 03/21/2023 0.0  0.0 - 0.2 % Final   Neutrophils Relative % 03/21/2023 73  % Final   Neutro Abs 03/21/2023 6.2  1.7 - 7.7 K/uL Final   Lymphocytes Relative 03/21/2023 20  % Final   Lymphs Abs 03/21/2023 1.7  0.7 - 4.0 K/uL Final   Monocytes Relative 03/21/2023 6  % Final   Monocytes Absolute 03/21/2023 0.5  0.1 - 1.0 K/uL Final   Eosinophils Relative 03/21/2023 1  %  Final   Eosinophils Absolute 03/21/2023 0.0  0.0 - 0.5 K/uL Final   Basophils Relative 03/21/2023 0  % Final   Basophils Absolute 03/21/2023 0.0  0.0 - 0.1 K/uL Final   Immature Granulocytes 03/21/2023 0  % Final   Abs Immature Granulocytes 03/21/2023 0.02  0.00 - 0.07 K/uL Final   Performed at [facility], [address]  [place], [place] [address]    Allergies as of 03/28/2023       Reactions   Duloxetine    dizziness        Medication List        Accurate as of March 28, 2023 10:06 AM. If you have any questions, ask your nurse or doctor.          Blood Glucose Monitoring Suppl Devi 1 each by Does not apply route in the morning, at noon, and at bedtime. May substitute to any manufacturer covered by patient's insurance.   BLOOD GLUCOSE TEST STRIPS Strp 1 each by In Vitro route in the morning, at noon, and at bedtime. May substitute to any manufacturer covered by patient's insurance.   fludrocortisone 0.1 MG tablet Commonly known as: FLORINEF Take 1 tablet Monday/Wednesday and Friday   gabapentin 100 MG capsule Commonly known as: Neurontin Take 1 capsule po QD. May increase to 1 capsule twice daily if tolerated well.   hydrocortisone 20 MG tablet Commonly known as: CORTEF Take 20 mg by mouth daily before breakfast.   hydrocortisone 10 MG tablet Commonly known as: CORTEF Take 30 mg by mouth daily.   Saccharomyces boulardii 500 MG Pack Take 500 mg by mouth in the morning and at bedtime.   Trelegy Ellipta 100-62.5-25 MCG/ACT Aepb Generic drug: Fluticasone-Umeclidin-Vilant INHALE 1 PUFF INTO THE LUNGS ONCE DAILY        Allergies:  Allergies  Allergen Reactions   Duloxetine     dizziness    Past Medical History:  Diagnosis Date   Adrenal insufficiency (HCC)    COPD (chronic obstructive pulmonary disease) (HCC)    Depression    Dyspnea    with 65ft of ambulation. 200 feet if he walks slow.   Fatty liver    History of  kidney stones    seen on CT Scan   Skin cancer (melanoma) (HCC)    Thyroid disease     Past Surgical History:  Procedure Laterality Date   MASS EXCISION Right 12/15/2020   Procedure: EXCISION OF MALIGNANT MASS RIGHT POSTERIOR SHOULDER;  Surgeon: Almond Lint, MD;  Location: [facility] OR;  Service: General;  Laterality: Right;   WISDOM TOOTH EXTRACTION      Family History  Problem Relation Age of Onset   Stroke Mother    Dementia Mother    Breast cancer Sister    Thyroid cancer Sister    Ovarian cancer Sister    Stroke Maternal Grandmother    Breast cancer Paternal Grandmother    Diabetes Paternal Aunt    Diabetes Paternal Uncle    Hypertension Daughter    Other Daughter        Uterine cysts   Thyroid disease Neg Hx     Social History:  reports that he has been smoking cigarettes. He started smoking about 41 years ago. He has a 20.9 pack-year smoking history. He has never used smokeless tobacco. He reports current alcohol use of about 6.0 standard drinks of alcohol per week. He reports that he does not use drugs.  Review of Systems  Same as above   Examination:   BP 122/80   Pulse 77   Ht 6' (1.829 m)   Wt 187 lb (84.8 kg)   SpO2 95%   BMI 25.36 kg/m   Assessment/Plan:   Diagnoses and all orders for this visit:  Adrenal insufficiency (HCC) -     Cortisol; Future -     Basic Metabolic Panel (BMET) -  Insulin-like growth factor; Future -     Prolactin; Future -     T4, free; Future -     Testosterone,Free and Total; Future -     ACTH; Future -     Growth hormone; Future -     Follicle stimulating hormone; Future -     Luteinizing hormone; Future  Pituitary adenoma (HCC) -     Insulin-like growth factor; Future -     Prolactin; Future -     T4, free; Future -     Testosterone,Free and Total; Future -     ACTH; Future -     Growth hormone; Future -     Follicle stimulating hormone; Future -     Luteinizing hormone; Future    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 Currently taking Cortef 10 mg at 7 am, 10 mg at 10 am and 10 mg at 2 pm , self stopped florinef 0.1 mg half tablet alternate days Recommend Cortef 10 mg at 7 am, 5 mg at 10 am and 10 mg at 2 pm  Discussed patient the side effects of excessive hydrocortisone, patient agrees and feels that he has been dealing with the same including behavioral issues as well as skin thinning Patient finds no difference between brand Cortef and generic hydrocortisone Instructed to take 20 mg at 7 in the morning and 10 mg at 3 PM Instructed to continue checking blood pressure at home, if blood pressure drops, try half a pill of 0.1 mg Florinef as tolerated Reports already following with cardiac etiology and neurology in the past.   2024 testosterone and free T4, free T3, TSH WNL Episodes unrelated to adrenal insufficiency as pt is adequately dosed with steroids and florinef   Recommend incorporating water and protein drinks, avoid red meats/plain sugars/sugary beverages   Check orthostatic BP and HR when he feels episodes coming as well as checking BG Doubling up steroid dose of days of "impending episode"  Recommend second cardiology opinion    04/2021 MRI HEAD WITHOUT AND WITH CONTRAST: Questionable pituitary MRI showed 4 mm nodule 02/2022 MRI HEAD WITHOUT AND WITH CONTRAST: Stable hypoenhancing area on the left side of the pituitary. This may represent a pituitary microadenoma Ordered 8 am pituitary labs, will follow up with MRI pituitary      Return in about 4 months (around 07/26/2023).   Jacorie Ernsberger 03/28/2023, 10:06 AM

## 2023-04-13 ENCOUNTER — Other Ambulatory Visit: Payer: Self-pay

## 2023-04-13 DIAGNOSIS — E230 Hypopituitarism: Secondary | ICD-10-CM

## 2023-04-13 NOTE — Addendum Note (Signed)
Addended by: Piedad Climes A on: 04/13/2023 11:42 AM   Modules accepted: Orders

## 2023-04-13 NOTE — Addendum Note (Signed)
Addended by: Piedad Climes A on: 04/13/2023 11:41 AM   Modules accepted: Orders

## 2023-04-17 ENCOUNTER — Other Ambulatory Visit: Payer: Self-pay | Admitting: Family Medicine

## 2023-04-17 DIAGNOSIS — J449 Chronic obstructive pulmonary disease, unspecified: Secondary | ICD-10-CM

## 2023-04-18 ENCOUNTER — Other Ambulatory Visit: Payer: Self-pay

## 2023-04-18 ENCOUNTER — Other Ambulatory Visit: Payer: BC Managed Care – PPO

## 2023-04-18 DIAGNOSIS — E230 Hypopituitarism: Secondary | ICD-10-CM

## 2023-04-18 DIAGNOSIS — D352 Benign neoplasm of pituitary gland: Secondary | ICD-10-CM | POA: Diagnosis not present

## 2023-04-18 DIAGNOSIS — E2740 Unspecified adrenocortical insufficiency: Secondary | ICD-10-CM

## 2023-04-18 NOTE — Addendum Note (Signed)
Addended by: Altamese [place] on: 04/18/2023 08:23 AM   Modules accepted: Orders

## 2023-04-22 LAB — BASIC METABOLIC PANEL
BUN: 14 mg/dL (ref 7–25)
CO2: 22 mmol/L (ref 20–32)
Calcium: 9.8 mg/dL (ref 8.6–10.3)
Chloride: 97 mmol/L — ABNORMAL LOW (ref 98–110)
Creat: 1.04 mg/dL (ref 0.70–1.35)
Glucose, Bld: 146 mg/dL — ABNORMAL HIGH (ref 65–99)
Potassium: 5.1 mmol/L (ref 3.5–5.3)
Sodium: 131 mmol/L — ABNORMAL LOW (ref 135–146)

## 2023-04-22 LAB — FOLLICLE STIMULATING HORMONE: FSH: 18.1 m[IU]/mL — ABNORMAL HIGH (ref 1.4–12.8)

## 2023-04-22 LAB — TESTOSTERONE, FREE: TESTOSTERONE FREE: 19.9 pg/mL — ABNORMAL LOW (ref 46.0–224.0)

## 2023-04-22 LAB — T4, FREE: Free T4: 1.3 ng/dL (ref 0.8–1.8)

## 2023-04-22 LAB — GROWTH HORMONE: Growth Hormone: 0.6 ng/mL (ref <=7.1)

## 2023-04-22 LAB — PROLACTIN: Prolactin: 11 ng/mL (ref 2.0–18.0)

## 2023-04-22 LAB — INSULIN-LIKE GROWTH FACTOR
IGF-I, LC/MS: 216 ng/mL (ref 41–279)
Z-Score (Male): 1.2 {STDV} (ref ?–2.0)

## 2023-04-22 LAB — CORTISOL: Cortisol, Plasma: 1.1 ug/dL — ABNORMAL LOW

## 2023-04-22 LAB — LUTEINIZING HORMONE: LH: 10.2 m[IU]/mL (ref 1.6–15.2)

## 2023-04-22 LAB — ESTRADIOL: Estradiol: 47 pg/mL — ABNORMAL HIGH (ref <=39)

## 2023-04-26 ENCOUNTER — Encounter: Payer: Self-pay | Admitting: "Endocrinology

## 2023-04-26 ENCOUNTER — Ambulatory Visit (INDEPENDENT_AMBULATORY_CARE_PROVIDER_SITE_OTHER): Payer: BC Managed Care – PPO | Admitting: "Endocrinology

## 2023-04-26 ENCOUNTER — Other Ambulatory Visit: Payer: BC Managed Care – PPO

## 2023-04-26 VITALS — BP 120/70 | HR 59 | Ht 72.0 in | Wt 185.2 lb

## 2023-04-26 DIAGNOSIS — E230 Hypopituitarism: Secondary | ICD-10-CM | POA: Diagnosis not present

## 2023-04-26 DIAGNOSIS — D352 Benign neoplasm of pituitary gland: Secondary | ICD-10-CM

## 2023-04-26 DIAGNOSIS — E2740 Unspecified adrenocortical insufficiency: Secondary | ICD-10-CM

## 2023-04-26 DIAGNOSIS — R7989 Other specified abnormal findings of blood chemistry: Secondary | ICD-10-CM

## 2023-04-26 MED ORDER — HYDROCORTISONE 10 MG PO TABS: 25.0000 mg | ORAL_TABLET | Freq: Every day | ORAL | 1 refills | Status: AC

## 2023-04-26 NOTE — Patient Instructions (Addendum)
Cortef 15 mg at 7 am and 10 mg at 2 pm

## 2023-04-26 NOTE — Progress Notes (Signed)
George Glen Rose, MD 04/26/23   Patient ID: George Cabal, male   DOB: [birth year], 62 y.o.   MRN: [id number]    Chief complaint: Followup of adrenal insufficiency  History of Present Illness:    Recent history: Currently taking Cortef 10 mg at 7 am and 10 mg at 2 pm  Patient reports feeling like "crap"  Self stopped florinef 0.1 mg half tablet alternate days - due to dizziness 04/18/23 patient had a 1 min near-syncopal episode "I could hear everything" but couldn't function otherwise Previously had another episode of fully passing out for about 4 min, no with "pulse", relative was EMT and was going to start CPR but he got self revived, was pale, drenched in sweat from head to thighs but did not froth/urinate. BP at hour ago was on lower side of 107 SBP with normal HR. BG was not checked.  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 His main symptom was weight loss, tiredness, weakness and lightheadedness along with passing out starting in January 2023 This was diagnosed by his oncologist Baseline cortisol level was 1.1 and ACTH level was undetectable in 1/23 Although initially was treated with hydrocortisone and continued to have some syncope and Florinef was added but this was stopped subsequently when blood pressure went up  Because of low normal standing blood pressures in 8/23 he was started on Florinef 3 days a week and switched from prednisone to hydrocortisone 20 mg in the morning and 10 mg in the evening  He had stopped Florinef for a second time because of rising blood pressure up to 164 and symptoms of throbbing in the head and eye pain  On his last visit blood pressure readings had been around 100 systolic at home and he was having significant decreased appetite, feeling lightheaded and feeling like passing out This improved significantly after doubling his hydrocortisone for a week Previously when he went  back to the regimen of 20 mg hydrocortisone in the morning and 10 in the afternoon he started getting fatigued and drowsy in the early afternoon With this he has started taking an extra half a tablet of Cortef around 1 PM with improvement in symptoms but felt same in office subsequently  He says he is getting the brand-name Cortef  Recent blood pressure at home apparently about 138/70  C/o thinning skin  04/2021 MRI HEAD WITHOUT AND WITH CONTRAST: Questionable pituitary MRI showed 4 mm nodule 02/2022 MRI HEAD WITHOUT AND WITH CONTRAST: Stable hypoenhancing area on the left side of the pituitary. This may represent a pituitary microadenoma    Lab Results  Component Value Date   CRTSLPL 1.1 (L) 04/18/2023   CRTSLPL 6.5 06/23/2021   CRTSLPL 0.7 06/23/2021   CRTSLPL 1.1 05/22/2021    PITUITARY functions:  At baseline he had the following labs Prolactin: borderline high.   Total testosterone WNL  THYROID:  TSH level was suppressed in 11/22 and free T4 was above normal only transiently in 1/23 at 1.82        He was given methimazole for short time but this was stopped when TSH went up higher at 11  Wt Readings from Last 3 Encounters:  04/26/23 185 lb 3.2 oz (84 kg)  03/28/23 187 lb (84.8 kg)  12/24/22 187 lb 9.6 oz (85.1 kg)   Lab Results  Component Value Date   TSH 1.247 03/21/2023   TSH 1.930 10/19/2022   TSH 2.19 09/22/2022   FREET4 1.3  04/18/2023   FREET4 1.14 09/22/2022   FREET4 0.94 07/28/2022   Component     Latest Ref Rng 06/23/2021 12/15/2021 07/28/2022  Testosterone     300.00 - 890.00 ng/dL 161  096.04  540.98      No visits with results within 1 Week(s) from this visit.  Latest known visit with results is:  Orders Only on 04/18/2023  Component Date Value Ref Range Status   Cortisol, Plasma 04/18/2023 1.1 (L)  mcg/dL Final   Comment: Reference Range: For 8 a.m.(7-9 a.m.) Specimen: 4.0-22.0 Reference Range: For 4 p.m.(3-5 p.m.) Specimen: 3.0-17.0   * Please  interpret above results accordingly * .    Prolactin 04/18/2023 11.0  2.0 - 18.0 ng/mL Final   LH 04/18/2023 10.2  1.6 - 15.2 mIU/mL Final   FSH 04/18/2023 18.1 (H)  1.4 - 12.8 mIU/mL Final   Free T4 04/18/2023 1.3  0.8 - 1.8 ng/dL Final   Growth Hormone 04/18/2023 0.6  < OR = 7.1 ng/mL Final   Comment: . Because of a pulsatile secretion pattern, random (unstimulated) growth hormone (GH) levels are  frequently undetectable in normal children and adults and are not reliable for diagnosing GH deficiency. Regarding suppression tests, failure to suppress GH is diagnostic of acromegaly. . Typical GH response in healthy subjects:   Using the glucose tolerance (GH suppression) test,   acromegaly is ruled out if the patient's GH level    is <1.0 ng/mL at any point in the timed sequence.   [Katznelson L, Laws Jr ER, Melmed S, et al.   Acromegaly: an Endocrine Society Clinical Practice   Guideline. J Clin Endocrinol Metab 2014; 99: 3933-   3951].   Using [facility] stimulation testing, the following result   at any point in the timed sequence makes GH   deficiency unlikely:    Adults (> or = 20 years):       Insulin Hypoglycemia  > or = 5.1 ng/mL       Arginine/GHRH         > or = 4.1 ng/mL       Glucagon              > or = 3.0 ng/mL    Children (< 20 years):       All Stimulat                          ion Tests > or = 10.0 ng/mL    IGF-I, LC/MS 04/18/2023 216  41 - 279 ng/mL Final   Z-Score (Male) 04/18/2023 1.2  -2.0 - 2.0 SD Final   Comment: . This test was developed and its analytical performance characteristics have been determined by [employer]. It has not been cleared or approved by FDA. This assay has been validated pursuant to the CLIA regulations and is used for clinical purposes. .    Glucose, Bld 04/18/2023 146 (H)  65 - 99 mg/dL Final   Comment: .            Fasting reference interval . For someone without known diabetes, a glucose value >125 mg/dL indicates that  they may have diabetes and this should be confirmed with a follow-up test. .    BUN 04/18/2023 14  7 - 25 mg/dL Final   Creat 11/91/4782 1.04  0.70 - 1.35 mg/dL Final   BUN/Creatinine Ratio 04/18/2023 SEE NOTE:  6 - 22 (calc) Final   Comment:  Not Reported: BUN and Creatinine are within    reference range. .    Sodium 04/18/2023 131 (L)  135 - 146 mmol/L Final   Potassium 04/18/2023 5.1  3.5 - 5.3 mmol/L Final   Chloride 04/18/2023 97 (L)  98 - 110 mmol/L Final   CO2 04/18/2023 22  20 - 32 mmol/L Final   Calcium 04/18/2023 9.8  8.6 - 10.3 mg/dL Final   Estradiol 16/02/9603 47 (H)  < OR = 39 pg/mL Final   Comment: Reference range established on post-pubertal patient population. No pre-pubertal reference range established using this assay. For any patients for whom low Estradiol levels are anticipated (e.g. males, pre-pubertal children and hypogonadal/post-menopausal  females), the [employer] Estradiol, Ultrasensitive, LCMSMS assay is recommended (order code 54098). . Please note: patients being treated with the drug  fulvestrant (Faslodex(R)) have demonstrated significant  interference in immunoassay methods for estradiol  measurement. The cross reactivity could lead to falsely  elevated estradiol test results leading to an  inappropriate clinical assessment of estrogen status. [facility] order code 30289-Estradiol,  Ultrasensitive LC/MS/MS demonstrates negligible cross  reactivity with fulvestrant.    TESTOSTERONE FREE 04/18/2023 19.9 (L)  46.0 - 224.0 pg/mL Final   Comment: [facility] [address] [place] [address] [phone] Junita Push L. Thompson Caul, MD, PhD     Allergies as of 04/26/2023       Reactions   Duloxetine    dizziness        Medication List        Accurate as of April 26, 2023 11:39 AM. If you have any questions, ask your nurse or doctor.          Blood Glucose  Monitoring Suppl Devi 1 each by Does not apply route in the morning, at noon, and at bedtime. May substitute to any manufacturer covered by patient's insurance.   BLOOD GLUCOSE TEST STRIPS Strp 1 each by In Vitro route in the morning, at noon, and at bedtime. May substitute to any manufacturer covered by patient's insurance.   fludrocortisone 0.1 MG tablet Commonly known as: FLORINEF Take 1 tablet Monday/Wednesday and Friday   gabapentin 100 MG capsule Commonly known as: Neurontin Take 1 capsule po QD. May increase to 1 capsule twice daily if tolerated well.   hydrocortisone 10 MG tablet Commonly known as: CORTEF Take 2.5 tablets (25 mg total) by mouth daily. What changed:  how much to take Another medication with the same name was removed. Continue taking this medication, and follow the directions you see here. Changed by: George [place]   Saccharomyces boulardii 500 MG Pack Take 500 mg by mouth in the morning and at bedtime.   Trelegy Ellipta 100-62.5-25 MCG/ACT Aepb Generic drug: Fluticasone-Umeclidin-Vilant INHALE 1 PUFF ONCE DAILY        Allergies:  Allergies  Allergen Reactions   Duloxetine     dizziness    Past Medical History:  Diagnosis Date   Adrenal insufficiency (HCC)    COPD (chronic obstructive pulmonary disease) (HCC)    Depression    Dyspnea    with 66ft of ambulation. 200 feet if he walks slow.   Fatty liver    History of kidney stones    seen on CT Scan   Skin cancer (melanoma) (HCC)    Thyroid disease     Past Surgical History:  Procedure Laterality Date   MASS EXCISION Right 12/15/2020   Procedure: EXCISION OF MALIGNANT MASS RIGHT POSTERIOR SHOULDER;  Surgeon: Almond Lint, MD;  Location: [facility] OR;  Service: General;  Laterality: Right;   WISDOM TOOTH EXTRACTION      Family History  Problem Relation Age of Onset   Stroke Mother    Dementia Mother    Breast cancer Sister    Thyroid cancer Sister    Ovarian cancer Sister    Stroke  Maternal Grandmother    Breast cancer Paternal Grandmother    Diabetes Paternal Aunt    Diabetes Paternal Uncle    Hypertension Daughter    Other Daughter        Uterine cysts   Thyroid disease Neg Hx     Social History:  reports that he has been smoking cigarettes. He started smoking about 41 years ago. He has a 21 pack-year smoking history. He has never used smokeless tobacco. He reports current alcohol use of about 6.0 standard drinks of alcohol per week. He reports that he does not use drugs.  Review of Systems  Same as above   Examination:   BP 120/70   Pulse (!) 59   Ht 6' (1.829 m)   Wt 185 lb 3.2 oz (84 kg)   SpO2 95%   BMI 25.12 kg/m   Assessment/Plan:   Diagnoses and all orders for this visit:  Adrenal insufficiency (HCC) -     Cancel: MR Brain W Wo Contrast; Future -     MR Brain W Wo Contrast; Future -     MR Brain W Wo Contrast -     Basic metabolic panel  Low testosterone in male -     Testosterone Total,Free,Bio, Males -     Cancel: MR Brain W Wo Contrast; Future -     MR Brain W Wo Contrast; Future -     MR Brain W Wo Contrast -     PSA  Pituitary microadenoma (HCC) -     Cancel: MR Brain W Wo Contrast; Future -     MR Brain W Wo Contrast; Future -     MR Brain W Wo Contrast  Other orders -     hydrocortisone (CORTEF) 10 MG tablet; Take 2.5 tablets (25 mg total) by mouth daily.     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 Currently taking Cortef 10 mg at 7 am and 10 mg at 2 pm, self stopped florinef 0.1 mg half tablet alternate days Recommend Cortef 15 mg at 7 am and 10 mg at 2 pm  Low sodium at 131, potassium high normal at 5.1, BP  120/70 Recommend to restart florinef half a pill  Repeat labs in 2 weeks  Discussed previously with patient the side effects of excessive hydrocortisone, patient agrees and feels that he has been dealing with the same including behavioral issues as well as skin thinning Patient finds no difference between brand Cortef and generic hydrocortisone Continue checking blood pressure at home, if blood pressure drops, try half a pill of 0.1 mg Florinef as tolerated Reports already following with cardiac etiology and neurology in the past.     2024 testosterone and free T4, free T3, TSH WNL 04/2023 free testosterone low Ordered repeat lab   04/2021 MRI HEAD WITHOUT AND WITH CONTRAST: Questionable pituitary MRI showed 4 mm nodule 02/2022 MRI HEAD WITHOUT  AND WITH CONTRAST: Stable hypoenhancing area on the left side of the pituitary. This may represent a pituitary microadenoma Ordered MRI brain Last pituitary complete work up done in 04/2023  Return in 4 months (on 08/25/2023) for visit, labs today.   Komal Motwani 04/26/2023, 11:39 AM

## 2023-04-28 ENCOUNTER — Ambulatory Visit (HOSPITAL_COMMUNITY)
Admission: RE | Admit: 2023-04-28 | Discharge: 2023-04-28 | Disposition: A | Discharge status: Home / Self Care | Payer: BC Managed Care – PPO | Source: Ambulatory Visit | Attending: "Endocrinology | Admitting: "Endocrinology

## 2023-04-28 DIAGNOSIS — D352 Benign neoplasm of pituitary gland: Secondary | ICD-10-CM | POA: Insufficient documentation

## 2023-04-28 DIAGNOSIS — R55 Syncope and collapse: Secondary | ICD-10-CM | POA: Diagnosis not present

## 2023-04-28 DIAGNOSIS — R7989 Other specified abnormal findings of blood chemistry: Secondary | ICD-10-CM | POA: Diagnosis not present

## 2023-04-28 DIAGNOSIS — E2740 Unspecified adrenocortical insufficiency: Secondary | ICD-10-CM | POA: Diagnosis not present

## 2023-04-28 MED ORDER — GADOBUTROL 1 MMOL/ML IV SOLN
8.0000 mL | Freq: Once | INTRAVENOUS | Status: AC | PRN
  Administered 2023-04-28: 8 mL via INTRAVENOUS

## 2023-05-01 LAB — TESTOSTERONE TOTAL,FREE,BIO, MALES
Albumin: 3.8 g/dL (ref 3.6–5.1)
Sex Hormone Binding: 93 nmol/L — ABNORMAL HIGH (ref 22–77)
Testosterone, Bioavailable: 66.9 ng/dL — ABNORMAL LOW (ref 110.0–575.0)
Testosterone, Free: 38.2 pg/mL — ABNORMAL LOW (ref 46.0–224.0)
Testosterone: 694 ng/dL (ref 250–827)

## 2023-05-01 LAB — ACTH: C206 ACTH: <5 pg/mL — ABNORMAL LOW (ref 6–50)

## 2023-05-01 NOTE — Progress Notes (Unsigned)
   Annual physical  Subjective   Patient ID: George West, male    DOB: 1960/06/24  Age: 62 y.o. MRN: [id number]  No chief complaint on file.  HPI [name] is a 62 y.o. old male here  for annual exam.   Changes in his/her health in the last 12 months: {yes/no:63}  The patient currently works as a***/does not work***.  He is married/single/in a relationship and recently/not recently sexually active with ***male partner.  He does*** use tobacco***, drinks***days per***, does***use recreational drugs.  The patient eats a***diet.  He exercises***times per week doing***.  The patient does***have an advanced directive.  The patient declines/endorses concerns for impaired hearing.  The patient declines/endorses concerns for impaired vision.  The patient does/does not see a dentist regularly.  The patient does***have a family history of prostate cancer.  The patient does***have a family history of colon cancer.  The patient has the following chronic health issues that are monitored on a routine basis: ***  Adrenal insufficiency-hypopituitarism.  Had an MRI of the brain  COPD-Trelegy  Metastatic melanoma  HPI  Separate, acute concerns today: ***  The ASCVD Risk score (Arnett DK, et al., 2019) failed to calculate for the following reasons:   The valid total cholesterol range is 130 to 320 mg/dL  Health Maintenance Due  Topic Date Due   Hepatitis C Screening  Never done   Zoster Vaccines- Shingrix (1 of 2) Never done   INFLUENZA VACCINE  Never done      Objective:     There were no vitals taken for this visit. {Vitals History (Optional):23777}  Physical Exam   No results found for any visits on 05/02/23.      Assessment & Plan:   There are no diagnoses linked to this encounter.   No follow-ups on file.    Sandre Kitty, MD

## 2023-05-02 ENCOUNTER — Ambulatory Visit (INDEPENDENT_AMBULATORY_CARE_PROVIDER_SITE_OTHER): Payer: BC Managed Care – PPO | Admitting: Family Medicine

## 2023-05-02 ENCOUNTER — Encounter: Payer: Self-pay | Admitting: Family Medicine

## 2023-05-02 VITALS — BP 150/80 | HR 70 | Ht 72.0 in | Wt 181.8 lb

## 2023-05-02 DIAGNOSIS — Z0000 Encounter for general adult medical examination without abnormal findings: Secondary | ICD-10-CM | POA: Diagnosis not present

## 2023-05-02 DIAGNOSIS — J449 Chronic obstructive pulmonary disease, unspecified: Secondary | ICD-10-CM | POA: Diagnosis not present

## 2023-05-02 DIAGNOSIS — N644 Mastodynia: Secondary | ICD-10-CM

## 2023-05-02 DIAGNOSIS — E230 Hypopituitarism: Secondary | ICD-10-CM

## 2023-05-02 DIAGNOSIS — Z720 Tobacco use: Secondary | ICD-10-CM

## 2023-05-02 DIAGNOSIS — F1020 Alcohol dependence, uncomplicated: Secondary | ICD-10-CM | POA: Diagnosis not present

## 2023-05-02 NOTE — Patient Instructions (Signed)
It was nice to see you today,  We addressed the following topics today: -I will order an ultrasound of your right breast.  Someone should call you to schedule this.  Depending on the results of that we will decide if further workup is needed.  Otherwise we will have you follow-up for your physical in 1 year.  Have a great day,  Frederic Jericho, MD

## 2023-05-03 DIAGNOSIS — N644 Mastodynia: Secondary | ICD-10-CM | POA: Insufficient documentation

## 2023-05-03 MED ORDER — ALBUTEROL SULFATE HFA 108 (90 BASE) MCG/ACT IN AERS: 2.0000 | INHALATION_SPRAY | RESPIRATORY_TRACT | 3 refills | Status: DC | PRN

## 2023-05-03 NOTE — Assessment & Plan Note (Signed)
Currently endorses drinking approximately 2 alcoholic drinks per night.  This is an improvement from previous years of up to 6 per night.  Encouraged patient to continue to decrease alcohol consumption.

## 2023-05-03 NOTE — Assessment & Plan Note (Signed)
Follows with endocrinology regularly.  Is on hydrocortisone 15 mg and, 10 milligrams 5 hours later.George West

## 2023-05-03 NOTE — Assessment & Plan Note (Addendum)
Patient complains of 2 months of tenderness/pain of the right nipple.  No discharge.  On exam he appears grossly normal.  There may be some faint swelling in the 12 o'clock position above the areola.  Recently had a PET scan that did not show anything in that area.  Initially discussed possibly getting a mammogram.  Patient agreed to soft tissue ultrasound of the breast.  There is the possibility of a PET scan missing something that may get picked up by mammogram.  If patient's ultrasound is unrevealing and he still has pain would encourage him to get mammogram.

## 2023-05-03 NOTE — Assessment & Plan Note (Signed)
Declines medication assistance.  Is tapering down gradually.  Currently at about a pack per week.  Encouraged patient to continue cessation

## 2023-05-05 NOTE — Telephone Encounter (Signed)
Copied from CRM [id number]. Topic: General - Other >> May 05, 2023  8:34 AM Louie Casa B wrote: Reason for CRM: [facility] called and said that the pt request for [facility] needs to be re faxed [phone] [facility]    This has been refax on 05/05/2023

## 2023-05-06 ENCOUNTER — Encounter: Payer: Self-pay | Admitting: "Endocrinology

## 2023-05-06 ENCOUNTER — Other Ambulatory Visit: Payer: BC Managed Care – PPO

## 2023-05-06 DIAGNOSIS — E2740 Unspecified adrenocortical insufficiency: Secondary | ICD-10-CM | POA: Diagnosis not present

## 2023-05-06 DIAGNOSIS — R7989 Other specified abnormal findings of blood chemistry: Secondary | ICD-10-CM | POA: Diagnosis not present

## 2023-05-07 LAB — BASIC METABOLIC PANEL
BUN: 7 mg/dL (ref 7–25)
CO2: 25 mmol/L (ref 20–32)
Calcium: 9.4 mg/dL (ref 8.6–10.3)
Chloride: 106 mmol/L (ref 98–110)
Creat: 0.91 mg/dL (ref 0.70–1.35)
Glucose, Bld: 92 mg/dL (ref 65–99)
Potassium: 4.4 mmol/L (ref 3.5–5.3)
Sodium: 140 mmol/L (ref 135–146)

## 2023-05-07 LAB — PSA: PSA: 2.86 ng/mL (ref <=4.00)

## 2023-05-09 ENCOUNTER — Ambulatory Visit: Payer: BC Managed Care – PPO | Admitting: "Endocrinology

## 2023-05-19 ENCOUNTER — Ambulatory Visit (INDEPENDENT_AMBULATORY_CARE_PROVIDER_SITE_OTHER): Payer: BC Managed Care – PPO | Admitting: "Endocrinology

## 2023-05-19 ENCOUNTER — Encounter: Payer: Self-pay | Admitting: "Endocrinology

## 2023-05-19 VITALS — BP 130/70 | HR 61 | Ht 72.0 in | Wt 189.0 lb

## 2023-05-19 DIAGNOSIS — E291 Testicular hypofunction: Secondary | ICD-10-CM | POA: Diagnosis not present

## 2023-05-19 DIAGNOSIS — E273 Drug-induced adrenocortical insufficiency: Secondary | ICD-10-CM

## 2023-05-19 DIAGNOSIS — E2740 Unspecified adrenocortical insufficiency: Secondary | ICD-10-CM

## 2023-05-19 MED ORDER — TESTOSTERONE 25 MG/2.5GM (1%) TD GEL: 25.0000 mg | Freq: Every day | TRANSDERMAL | 3 refills | Status: DC

## 2023-05-19 NOTE — Progress Notes (Signed)
 Obadiah Birmingham, MD 05/19/23   Patient ID: George West, male   DOB: 1960/07/08, 63 y.o.   MRN: [id number]    Chief complaint: Followup of adrenal insufficiency  History of Present Illness:    Recent history: Currently taking Cortef  15 mg at 7 am and 5 mg at 1 pm - feels well No passing out episodes since 2 months No history of heart attack/stroke 04/26/23: Patient has primary hypogonadism confirmed by low free testosterone  with high LH, which produces more estrogen, boosting SHBG, pseudo normalizing the total testosterone . Interested in starting testosterone  treatment for primary hypogonadism 04/28/23 MRI brain W/WO reported : The pituitary gland is normal in size with a concave upper margin. The infundibulum is midline. The gland enhances in a homogeneous fashion. No evidence of pituitary adenoma. I think the findings on the previous study were due to partial volume averaging with the cavernous sinuses.    Patient has a tendency to change doses or dose timings or stops taking medication to feel the kick on resuming the steroid pills as he thinks he has developed tolerance to steroids  Not taking florinef  0.1 mg half tablet alternate days - due to dizziness Previously had another episode of fully passing out for about 4 min, no with pulse, relative was EMT and was going to start CPR but he got self revived, was pale, drenched in sweat from head to thighs but did not froth/urinate. BP at hour ago was on lower side of 107 SBP with normal HR. BG was not checked.  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  His main symptom was weight loss, tiredness, weakness and lightheadedness along with passing out starting in January 2023 This was diagnosed by his oncologist Baseline cortisol level was 1.1 and ACTH  level was undetectable in 1/23 Although initially was treated with hydrocortisone  and continued to have some  syncope and Florinef  was added but this was stopped subsequently when blood pressure went up  Because of low normal standing blood pressures in 8/23 he was started on Florinef  3 days a week and switched from prednisone  to hydrocortisone  20 mg in the morning and 10 mg in the evening  He had stopped Florinef  for a second time because of rising blood pressure up to 164 and symptoms of throbbing in the head and eye pain  On his last visit blood pressure readings had been around 100 systolic at home and he was having significant decreased appetite, feeling lightheaded and feeling like passing out This improved significantly after doubling his hydrocortisone  for a week Previously when he went back to the regimen of 20 mg hydrocortisone  in the morning and 10 in the afternoon he started getting fatigued and drowsy in the early afternoon With this he has started taking an extra half a tablet of Cortef  around 1 PM with improvement in symptoms but felt same in office subsequently  He says he is getting the brand-name Cortef   Recent blood pressure at home apparently about 138/70  C/o thinning skin  04/2021 MRI HEAD WITHOUT AND WITH CONTRAST: Questionable pituitary MRI showed 4 mm nodule 02/2022 MRI HEAD WITHOUT AND WITH CONTRAST: Stable hypoenhancing area on the left side of the pituitary. 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   Total testosterone  WNL  THYROID :  TSH level was suppressed in 11/22 and free T4 was above normal only transiently in 1/23 at 1.82        He was given methimazole  for short time but this was stopped when TSH went up higher at 11  Wt Readings from Last 3 Encounters:  05/19/23 189 lb (85.7 kg)  05/02/23 181 lb 12.8 oz (82.5 kg)   04/26/23 185 lb 3.2 oz (84 kg)   Lab Results  Component Value Date   TSH 1.247 03/21/2023   TSH 1.930 10/19/2022   TSH 2.19 09/22/2022   FREET4 1.3 04/18/2023   FREET4 1.14 09/22/2022   FREET4 0.94 07/28/2022   Component     Latest Ref Rng 06/23/2021 12/15/2021 07/28/2022  Testosterone      300.00 - 890.00 ng/dL 518  608.78  625.70      No visits with results within 1 Week(s) from this visit.  Latest known visit with results is:  Office Visit on 04/26/2023  Component Date Value Ref Range Status   PSA 05/06/2023 2.86  < OR = 4.00 ng/mL Final   Comment: The total PSA value from this assay system is  standardized against the WHO standard. The test  result will be approximately 20% lower when compared  to the equimolar-standardized total PSA (Beckman  Coulter). Comparison of serial PSA results should be  interpreted with this fact in mind. . This test was performed using the Siemens  chemiluminescent method. Values obtained from  different assay methods cannot be used interchangeably. PSA levels, regardless of value, should not be interpreted as absolute evidence of the presence or absence of disease.    Glucose, Bld 05/06/2023 92  65 - 99 mg/dL Final   Comment: .            Fasting reference interval .    BUN 05/06/2023 7  7 - 25 mg/dL Final   Creat 87/79/7975 0.91  0.70 - 1.35 mg/dL Final   BUN/Creatinine Ratio 05/06/2023 SEE NOTE:  6 - 22 (calc) Final   Comment:    Not Reported: BUN and Creatinine are within    reference range. .    Sodium 05/06/2023 140  135 - 146 mmol/L Final   Potassium 05/06/2023 4.4  3.5 - 5.3 mmol/L Final   Chloride 05/06/2023 106  98 - 110 mmol/L Final   CO2 05/06/2023 25  20 - 32 mmol/L Final   Calcium 05/06/2023 9.4  8.6 - 10.3 mg/dL Final    Allergies as of 05/19/2023       Reactions   Duloxetine     dizziness        Medication List        Accurate as of May 19, 2023 11:54 AM. If you have any questions, ask your nurse or  doctor.          albuterol  108 (90 Base) MCG/ACT inhaler Commonly known as: VENTOLIN  HFA Inhale 2 puffs into the lungs every 4 (four) hours as needed for wheezing or shortness of breath.   Blood Glucose Monitoring Suppl Devi 1 each by Does not apply route in the morning, at noon, and at bedtime. May substitute to any manufacturer covered by patient's insurance.   hydrocortisone  10 MG tablet Commonly known as: CORTEF  Take 2.5 tablets (25 mg total) by mouth daily.   Testosterone  25 MG/2.5GM (1%) Gel Commonly known as: AndroGel  Place 25 mg onto the skin daily. Started by: Guerino Caporale  Trelegy Ellipta  100-62.5-25 MCG/ACT Aepb Generic drug: Fluticasone -Umeclidin-Vilant INHALE 1 PUFF ONCE DAILY        Allergies:  Allergies  Allergen Reactions   Duloxetine      dizziness    Past Medical History:  Diagnosis Date   Adrenal insufficiency (HCC)    Anxiety 01/01/20   Cataract 01/2023   COPD (chronic obstructive pulmonary disease) (HCC)    Depression    Dyspnea    with 14ft of ambulation. 200 feet if he walks slow.   Emphysema of lung (HCC) 01/01/20   Fatty liver    History of kidney stones    seen on CT Scan   Neuromuscular disorder (HCC) 01/01/20   Skin cancer (melanoma) (HCC)    Substance abuse (HCC)    Alcohol   Thyroid  disease     Past Surgical History:  Procedure Laterality Date   MASS EXCISION Right 12/15/2020   Procedure: EXCISION OF MALIGNANT MASS RIGHT POSTERIOR SHOULDER;  Surgeon: Aron Shoulders, MD;  Location: [facility] OR;  Service: General;  Laterality: Right;   WISDOM TOOTH EXTRACTION      Family History  Problem Relation Age of Onset   Stroke Mother    Dementia Mother    Heart disease Mother    Breast cancer Sister    Thyroid  cancer Sister    Ovarian cancer Sister    Stroke Maternal Grandmother    Heart disease Maternal Grandmother    Breast cancer Paternal Grandmother    Cancer Paternal Grandmother    Diabetes Paternal Aunt    Diabetes Paternal  Uncle    Hypertension Daughter    Other Daughter        Uterine cysts   Diabetes Maternal Aunt    Thyroid  disease Neg Hx     Social History:  reports that he has been smoking cigarettes and cigars. He started smoking about 42 years ago. He has a 21 pack-year smoking history. He has never used smokeless tobacco. He reports current alcohol use of about 32.0 standard drinks of alcohol per week. He reports that he does not use drugs.  Review of Systems  Same as above   Examination:   BP 130/70 (BP Location: Left Arm, Patient Position: Sitting, Cuff Size: Normal)   Pulse 61   Ht 6' (1.829 m)   Wt 189 lb (85.7 kg)   SpO2 97%   BMI 25.63 kg/m   Physical Exam Constitutional:      Appearance: Normal appearance. Not ill-appearing.  HENT:     Head: Normocephalic and atraumatic.  Eyes:     General: No scleral icterus.    Conjunctiva/sclera: Conjunctivae normal.  Pulmonary:     Effort: Pulmonary effort is normal. No respiratory distress.  Musculoskeletal:     Cervical back: Normal range of motion. No rigidity.  Genitourinary exam: testes WNL, volume estimated 20 ml B/L, no abnormalities/tenderness/mass notes  Skin:    Coloration: Skin is not jaundiced.     Findings: No rash.  Neurological:     Mental Status: Alert and oriented to person, place, and time. Psychiatric:        Behavior: Behavior normal.        Judgment: Judgment normal.   Assessment/Plan:   George West was seen today for follow-up.  Diagnoses and all orders for this visit:  Adrenal insufficiency (HCC)  Primary hypogonadism in male -     Testosterone  Total,Free,Bio, Males  Other orders -     Testosterone  (ANDROGEL ) 25 MG/2.5GM (1%) GEL; Place 25 mg onto the  skin daily.    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 Continue current Cortef  15 mg at 7 am AND 5 mg at 1 pm, self stopped florinef  0.1 mg half tablet alternate days (improved salt by excess salt intake) Discussed previously the side effects of excessive hydrocortisone , patient agrees and feels that he has been dealing with the same including behavioral issues as well as skin thinning Patient finds no difference between brand Cortef  and generic hydrocortisone  Continue checking blood pressure at home, if blood pressure drops, try half a pill of 0.1 mg Florinef  as tolerated Reports already following with cardiac etiology and neurology in the past.   Passing out episodes likely unrelated to adrenal insufficiency as pt is adequately dosed with steroids (pt self discontinues florinef  ) Recommend incorporating water and protein drinks, avoid red meats/plain sugars/sugary beverages   Check orthostatic BP and HR when he feels episodes coming as well as checking BG Doubling up steroid dose of days of impending episode  Recommend second cardiology opinion   04/26/23 Patient has primary hypogonadism confirmed by low free testosterone  with high LH, which produces more estrogen, boosting SHBG, pseudo normalizing the total testosterone . Recommend starting Androgel  25 mg  every day Discussed risks and S/E, no current contraindication   04/2021 MRI HEAD WITHOUT AND WITH CONTRAST: Questionable pituitary MRI showed 4 mm nodule 02/2022 MRI HEAD WITHOUT AND WITH CONTRAST: Stable hypoenhancing area on the left side of the pituitary. This may represent a pituitary microadenoma 04/28/23: 02/2022 MRI HEAD WITHOUT AND WITH CONTRAST The pituitary gland is normal in size with a concave upper margin. The infundibulum is midline. The gland enhances in a homogeneous fashion. No evidence of pituitary adenoma. I think the findings on the previous study were due to partial volume  averaging with the cavernous sinuses. No further MRI/pituitary work up needed   Return in about 4 weeks (around 06/16/2023).   Rondell Frick 05/19/2023, 11:54 AM

## 2023-05-19 NOTE — Patient Instructions (Signed)
 Hypogonadism start meds note for me  - Start  Androgel /Testim  1% gel 50-100 mg/day (5g = 50mg  of testosterone )                         Wash the skin with soap and water before having skin-to-skin     contact.    Apply to clean, intact, dry skin of shoulders/upper arms/abdomen (not to genitals)   Cover the application site with a shirt   Testosterone  levels are maintained when the application site is    washed 4-6H after application  Androderm  2-6 mg /day   contact me if develop skin irritation as could use Triamcinolone     cream to prevent it.  Tesosterone Cypionate IM 100-200 mg every 2 weeks  Counseled side effects of testosterone  therapy including prostate cancer, increase Benign Prostatic Hyperplasia and increase in urinary obstruction and lower urinary tracts symptoms.   Counseled that testosterone  can worsen sleep apnea. Counseled that testosterone  can increase hematocrit/erythrocytosis and so can increase risk for blood clots. Some studies show that testosterone  can increase the cardiovascular risk. But TEEAM trial has shown that testosterone  does not increase progression of subclinical atherosclerosis. Counseled alternative medical options like clomid in place of testosterone   Hypogonadism instructions for patient  -Discussed the expectations: Libido improves markedly and immediately in 1-2 months. Energy and fat free mass within first 3-6 months. BMD increase may take up to 2 years. -Discussed importance of follow up PSA and H&H levels. DRE to be done yearly by PCP. -Needs screening for OSA if develops suggestive symptoms (e.g. snoring, non refreshing sleep etc) -Testosterone  Blood levels to be checked  for IM: at midpoint between the 2 injections  for patch: apply at bedtime and check level in AM  for GEL: anytime  for Buccal: immediately before or after application of fresh system  for Pellets: at the end of the dosing interval -Goal level in the low/mid normal  range.

## 2023-05-24 ENCOUNTER — Encounter: Payer: Self-pay | Admitting: "Endocrinology

## 2023-05-24 ENCOUNTER — Telehealth: Payer: Self-pay

## 2023-05-24 NOTE — Telephone Encounter (Signed)
 Testosterone needs PA

## 2023-05-25 ENCOUNTER — Other Ambulatory Visit (HOSPITAL_COMMUNITY): Payer: Self-pay

## 2023-05-25 ENCOUNTER — Telehealth: Payer: Self-pay

## 2023-05-25 NOTE — Telephone Encounter (Signed)
 Pharmacy Patient Advocate Encounter   Received notification from Pt Calls Messages that prior authorization for Testosterone  is required/requested.   I am unable to verify active insurance but found a PA [facility].   PA was cancelled by insurance: [employer] does not manage PA for this patient.   Please have pt provide upated insurance

## 2023-05-30 ENCOUNTER — Other Ambulatory Visit (HOSPITAL_COMMUNITY): Payer: Self-pay

## 2023-05-30 NOTE — Telephone Encounter (Signed)
 This card is only showing medical coverage, not pharmacy benefits. The pharmacy benefits that seem to be associated with [employer] are showing up as [facility] which is expired.   I have reached out to [facility], but the last thing they have filled for him was in November of last year.

## 2023-06-03 ENCOUNTER — Telehealth: Payer: Self-pay

## 2023-06-03 ENCOUNTER — Other Ambulatory Visit (HOSPITAL_COMMUNITY): Payer: Self-pay

## 2023-06-03 NOTE — Telephone Encounter (Signed)
Pharmacy Patient Advocate Encounter   Received notification from RX Request Messages that prior authorization for Testosterone 1% gel is required/requested.   Insurance verification completed.   The patient is insured through [employer] .   Per test claim: PA required; PA started via CoverMyMeds. KEY [id number] . Waiting for clinical questions to populate.

## 2023-06-09 ENCOUNTER — Other Ambulatory Visit (HOSPITAL_COMMUNITY): Payer: Self-pay

## 2023-06-09 ENCOUNTER — Telehealth: Payer: Self-pay

## 2023-06-09 NOTE — Telephone Encounter (Signed)
Any updates? 

## 2023-06-09 NOTE — Telephone Encounter (Signed)
Pharmacy Patient Advocate Encounter   Received notification from Pt Calls Messages that prior authorization for testosterone 1% gel is required/requested.   Insurance verification completed.   The patient is insured through [facility] .   Per test claim: PA required; PA submitted to above mentioned insurance via [employer] PA Key/confirmation #/[id number] Status is pending

## 2023-06-13 ENCOUNTER — Ambulatory Visit (HOSPITAL_COMMUNITY)
Admission: RE | Admit: 2023-06-13 | Discharge: 2023-06-13 | Disposition: A | Discharge status: Home / Self Care | Payer: BC Managed Care – PPO | Source: Ambulatory Visit | Attending: Hematology | Admitting: Hematology

## 2023-06-13 DIAGNOSIS — N3289 Other specified disorders of bladder: Secondary | ICD-10-CM | POA: Diagnosis not present

## 2023-06-13 DIAGNOSIS — I700 Atherosclerosis of aorta: Secondary | ICD-10-CM | POA: Diagnosis not present

## 2023-06-13 DIAGNOSIS — M50322 Other cervical disc degeneration at C5-C6 level: Secondary | ICD-10-CM | POA: Diagnosis not present

## 2023-06-13 DIAGNOSIS — C4359 Malignant melanoma of other part of trunk: Secondary | ICD-10-CM | POA: Insufficient documentation

## 2023-06-13 DIAGNOSIS — C439 Malignant melanoma of skin, unspecified: Secondary | ICD-10-CM | POA: Diagnosis not present

## 2023-06-13 MED ORDER — IOHEXOL 350 MG/ML SOLN: 100.0000 mL | Freq: Once | INTRAVENOUS | Status: DC | PRN

## 2023-06-13 MED ORDER — IOHEXOL 300 MG/ML  SOLN: 75.0000 mL | Freq: Once | INTRAMUSCULAR | Status: DC | PRN

## 2023-06-13 MED ORDER — IOHEXOL 300 MG/ML  SOLN
100.0000 mL | Freq: Once | INTRAMUSCULAR | Status: AC | PRN
  Administered 2023-06-13: 100 mL via INTRAVENOUS

## 2023-06-15 NOTE — Telephone Encounter (Signed)
Patient called to cancel appointment tomorrow regarding the prior auth they are waiting to hear back.  Patient would like a call regarding the prior auth.

## 2023-06-16 ENCOUNTER — Ambulatory Visit: Payer: BC Managed Care – PPO | Admitting: "Endocrinology

## 2023-06-17 ENCOUNTER — Other Ambulatory Visit: Payer: Self-pay | Admitting: Family Medicine

## 2023-06-17 DIAGNOSIS — J449 Chronic obstructive pulmonary disease, unspecified: Secondary | ICD-10-CM

## 2023-06-17 MED ORDER — TRELEGY ELLIPTA 100-62.5-25 MCG/ACT IN AEPB: INHALATION_SPRAY | RESPIRATORY_TRACT | 5 refills | Status: DC

## 2023-06-20 ENCOUNTER — Other Ambulatory Visit (HOSPITAL_COMMUNITY): Payer: Self-pay

## 2023-06-20 ENCOUNTER — Telehealth: Payer: Self-pay

## 2023-06-20 NOTE — Telephone Encounter (Signed)
Patient would like a call from supervisor upon your return to office and to discuss how the prior authorization has been handle and why it has took so long.

## 2023-06-20 NOTE — Telephone Encounter (Signed)
PA for testosterone has been submitted again through [employer] at the request of the Rx Benefits rep. [facility] has already stated twice that they do not handle Pas for this pt. I have submitted the request for urgent review and faxed over the supporting documents

## 2023-06-20 NOTE — Telephone Encounter (Signed)
Patient reaching out again for follow up on this PA

## 2023-06-23 ENCOUNTER — Other Ambulatory Visit (HOSPITAL_COMMUNITY): Payer: Self-pay

## 2023-06-23 NOTE — Telephone Encounter (Signed)
 Received fax from [employer] requesting additional information.   Form was completed and faxed back

## 2023-06-27 ENCOUNTER — Other Ambulatory Visit: Payer: Self-pay

## 2023-06-27 DIAGNOSIS — C4359 Malignant melanoma of other part of trunk: Secondary | ICD-10-CM

## 2023-06-27 DIAGNOSIS — R7989 Other specified abnormal findings of blood chemistry: Secondary | ICD-10-CM

## 2023-06-28 ENCOUNTER — Other Ambulatory Visit (HOSPITAL_COMMUNITY): Payer: Self-pay

## 2023-06-28 ENCOUNTER — Inpatient Hospital Stay: Payer: BC Managed Care – PPO | Attending: Hematology

## 2023-06-28 ENCOUNTER — Inpatient Hospital Stay: Payer: BC Managed Care – PPO | Attending: Hematology | Admitting: Hematology

## 2023-06-28 VITALS — BP 127/73 | HR 60 | Temp 97.9°F | Resp 16 | Wt 189.1 lb

## 2023-06-28 DIAGNOSIS — R7989 Other specified abnormal findings of blood chemistry: Secondary | ICD-10-CM

## 2023-06-28 DIAGNOSIS — Z803 Family history of malignant neoplasm of breast: Secondary | ICD-10-CM | POA: Insufficient documentation

## 2023-06-28 DIAGNOSIS — Z8041 Family history of malignant neoplasm of ovary: Secondary | ICD-10-CM | POA: Diagnosis not present

## 2023-06-28 DIAGNOSIS — Z808 Family history of malignant neoplasm of other organs or systems: Secondary | ICD-10-CM | POA: Diagnosis not present

## 2023-06-28 DIAGNOSIS — D472 Monoclonal gammopathy: Secondary | ICD-10-CM | POA: Diagnosis not present

## 2023-06-28 DIAGNOSIS — C4360 Malignant melanoma of unspecified upper limb, including shoulder: Secondary | ICD-10-CM | POA: Insufficient documentation

## 2023-06-28 DIAGNOSIS — F17210 Nicotine dependence, cigarettes, uncomplicated: Secondary | ICD-10-CM | POA: Insufficient documentation

## 2023-06-28 DIAGNOSIS — Z809 Family history of malignant neoplasm, unspecified: Secondary | ICD-10-CM | POA: Diagnosis not present

## 2023-06-28 DIAGNOSIS — C4359 Malignant melanoma of other part of trunk: Secondary | ICD-10-CM

## 2023-06-28 DIAGNOSIS — Z79620 Long term (current) use of immunosuppressive biologic: Secondary | ICD-10-CM | POA: Insufficient documentation

## 2023-06-28 DIAGNOSIS — D181 Lymphangioma, any site: Secondary | ICD-10-CM | POA: Diagnosis not present

## 2023-06-28 LAB — CMP (CANCER CENTER ONLY)
ALT: 19 U/L (ref 0–44)
AST: 21 U/L (ref 15–41)
Albumin: 4.4 g/dL (ref 3.5–5.0)
Alkaline Phosphatase: 104 U/L (ref 38–126)
Anion gap: 6 (ref 5–15)
BUN: 11 mg/dL (ref 8–23)
CO2: 27 mmol/L (ref 22–32)
Calcium: 9.4 mg/dL (ref 8.9–10.3)
Chloride: 105 mmol/L (ref 98–111)
Creatinine: 0.94 mg/dL (ref 0.61–1.24)
GFR, Estimated: >60 mL/min (ref >60)
Glucose, Bld: 103 mg/dL — ABNORMAL HIGH (ref 70–99)
Potassium: 5 mmol/L (ref 3.5–5.1)
Sodium: 138 mmol/L (ref 135–145)
Total Bilirubin: 0.8 mg/dL (ref 0.0–1.2)
Total Protein: 8 g/dL (ref 6.5–8.1)

## 2023-06-28 LAB — TSH: TSH: 1.965 u[IU]/mL (ref 0.350–4.500)

## 2023-06-28 LAB — CBC WITH DIFFERENTIAL (CANCER CENTER ONLY)
Abs Immature Granulocytes: 0.02 10*3/uL (ref 0.00–0.07)
Basophils Absolute: 0.1 10*3/uL (ref 0.0–0.1)
Basophils Relative: 1 %
Eosinophils Absolute: 0.1 10*3/uL (ref 0.0–0.5)
Eosinophils Relative: 2 %
HCT: 49.6 % (ref 39.0–52.0)
Hemoglobin: 16.4 g/dL (ref 13.0–17.0)
Immature Granulocytes: 0 %
Lymphocytes Relative: 31 %
Lymphs Abs: 2.4 10*3/uL (ref 0.7–4.0)
MCH: 32.2 pg (ref 26.0–34.0)
MCHC: 33.1 g/dL (ref 30.0–36.0)
MCV: 97.4 fL (ref 80.0–100.0)
Monocytes Absolute: 0.5 10*3/uL (ref 0.1–1.0)
Monocytes Relative: 7 %
Neutro Abs: 4.6 10*3/uL (ref 1.7–7.7)
Neutrophils Relative %: 59 %
Platelet Count: 205 10*3/uL (ref 150–400)
RBC: 5.09 MIL/uL (ref 4.22–5.81)
RDW: 12.7 % (ref 11.5–15.5)
WBC Count: 7.7 10*3/uL (ref 4.0–10.5)
nRBC: 0 % (ref 0.0–0.2)

## 2023-06-28 NOTE — Progress Notes (Signed)
 HEMATOLOGY/ONCOLOGY CLINIC NOTE  Date of Service: 06/28/23   Patient Care Team: Sandre Kitty, MD as PCP - General (Family Medicine) Nahser, Deloris Ping, MD as PCP - Cardiology (Cardiology)  CHIEF COMPLAINTS/PURPOSE OF CONSULTATION:  Follow-up for continued management of metastatic melanoma  HISTORY OF PRESENTING ILLNESS:  Previous notes for details of initial presentation  INTERVAL HISTORY  George West is a 63 year old male is here for continued evaluation and management of metastatic melanoma.   Patient was last seen by me on 03/21/2023 and he complained of persistent right mammary papilla soreness, enlarged lymph node near his right neck, occasional SOB, and occasional bilateral leg swelling.   Patient is accompanied by her wife during this visit. Patient notes he has been doing well overall since our last visit. He had couple episodes of passing out since our last visit, most recent was in November. He is currently taking Hydrocortisone 15 mg and 5 mg. He passed out when he held his Hydrocortisone medication.   He denies any new infection issues, fever, chills, night sweats, unexpected weight loss, back pain, abdominal pain, chest pain, or leg swelling.   He denies any urinary symptoms or hematuria.   He does complain of enlarged lymph nodes near his right neck, which has been there for couple of years. CT scan showed Complex cystic lesions in the right lateral neck remain size stable. He has not been to a Careers adviser.  Patient notes that his brother had similar cystic lesion on his neck.   MEDICAL HISTORY:  Past Medical History:  Diagnosis Date   Adrenal insufficiency (HCC)    Anxiety 01/01/20   Cataract 01/2023   COPD (chronic obstructive pulmonary disease) (HCC)    Depression    Dyspnea    with 72ft of ambulation. 200 feet if he walks slow.   Emphysema of lung (HCC) 01/01/20   Fatty liver    History of kidney stones    seen on CT Scan   Neuromuscular disorder  (HCC) 01/01/20   Skin cancer (melanoma) (HCC)    Substance abuse (HCC)    Alcohol   Thyroid disease     SURGICAL HISTORY: Past Surgical History:  Procedure Laterality Date   MASS EXCISION Right 12/15/2020   Procedure: EXCISION OF MALIGNANT MASS RIGHT POSTERIOR SHOULDER;  Surgeon: Almond Lint, MD;  Location: [facility] OR;  Service: General;  Laterality: Right;   WISDOM TOOTH EXTRACTION      SOCIAL HISTORY: Social History   Socioeconomic History   Marital status: Married    Spouse name: Elease Hashimoto   Number of children: 2   Years of education: Not on file   Highest education level: Not on file  Occupational History   Occupation: Public librarian    Comment: [name]  Tobacco Use   Smoking status: Every Day    Current packs/day: 0.50    Average packs/day: 0.5 packs/day for 42.1 years (21.1 ttl pk-yrs)    Types: Cigarettes, Cigars    Start date: 05/17/1981   Smokeless tobacco: Never  Vaping Use   Vaping status: Never Used  Substance and Sexual Activity   Alcohol use: Yes    Alcohol/week: 32.0 standard drinks of alcohol    Types: 18 Cans of beer, 14 Shots of liquor per week    Comment: 4 per day   Drug use: No   Sexual activity: Not Currently    Partners: Female    Birth control/protection: None  Other Topics Concern   Not  on file  Social History Narrative   Married   2 children--- 1 daughter/1 son   6 step children   Social Drivers of Corporate investment banker Strain: Not on file  Food Insecurity: Not on file  Transportation Needs: Not on file  Physical Activity: Not on file  Stress: Not on file  Social Connections: Not on file  Intimate Partner Violence: Not on file    FAMILY HISTORY: Family History  Problem Relation Age of Onset   Stroke Mother    Dementia Mother    Heart disease Mother    Breast cancer Sister    Thyroid cancer Sister    Ovarian cancer Sister    Stroke Maternal Grandmother    Heart disease Maternal Grandmother    Breast  cancer Paternal Grandmother    Cancer Paternal Grandmother    Diabetes Paternal Aunt    Diabetes Paternal Uncle    Hypertension Daughter    Other Daughter        Uterine cysts   Diabetes Maternal Aunt    Thyroid disease Neg Hx     ALLERGIES:  is allergic to duloxetine.  MEDICATIONS:  Current Outpatient Medications  Medication Sig Dispense Refill   albuterol (VENTOLIN HFA) 108 (90 Base) MCG/ACT inhaler Inhale 2 puffs into the lungs every 4 (four) hours as needed for wheezing or shortness of breath. 1 each 3   Blood Glucose Monitoring Suppl DEVI 1 each by Does not apply route in the morning, at noon, and at bedtime. May substitute to any manufacturer covered by patient's insurance. 1 each 0   Fluticasone-Umeclidin-Vilant (TRELEGY ELLIPTA) 100-62.5-25 MCG/ACT AEPB INHALE 1 PUFF INTO THE LUNGS ONCE DAILY 60 each 5   hydrocortisone (CORTEF) 10 MG tablet Take 2.5 tablets (25 mg total) by mouth daily. 225 tablet 1   Testosterone (ANDROGEL) 25 MG/2.5GM (1%) GEL Place 25 mg onto the skin daily. 2.5 g 3   No current facility-administered medications for this visit.    REVIEW OF SYSTEMS:   .10 Point review of Systems was done is negative except as noted above.  PHYSICAL EXAMINATION: .BP 127/73 (BP Location: Left Arm, Patient Position: Sitting)   Pulse 60   Temp 97.9 F (36.6 C) (Temporal)   Resp 16   Wt 189 lb 1.6 oz (85.8 kg)   SpO2 98%   BMI 25.65 kg/m   GENERAL:alert, in no acute distress and comfortable SKIN: no acute rashes, no significant lesions EYES: conjunctiva are pink and non-injected, sclera anicteric OROPHARYNX: MMM, no exudates, no oropharyngeal erythema or ulceration NECK: supple, no JVD LYMPH:  no palpable lymphadenopathy in the cervical, axillary or inguinal regions LUNGS: clear to auscultation b/l with normal respiratory effort HEART: regular rate & rhythm ABDOMEN:  normoactive bowel sounds , non tender, not distended. Extremity: no pedal edema PSYCH: alert &  oriented x 3 with fluent speech NEURO: no focal motor/sensory deficits   LABORATORY DATA:  I have reviewed the data as listed  .    Latest Ref Rng & Units 06/28/2023   12:43 PM 03/21/2023   12:50 PM 10/19/2022   11:38 AM  CBC  WBC 4.0 - 10.5 K/uL 7.7  8.6  8.8   Hemoglobin 13.0 - 17.0 g/dL 91.4  78.2  95.6   Hematocrit 39.0 - 52.0 % 49.6  46.0  46.6   Platelets 150 - 400 K/uL 205  201  224    .    Latest Ref Rng & Units 06/28/2023   12:43 PM  05/06/2023    8:51 AM 04/18/2023    8:19 AM  CMP  Glucose 70 - 99 mg/dL 161  92  096   BUN 8 - 23 mg/dL 11  7  14    Creatinine 0.61 - 1.24 mg/dL 0.45  4.09  8.11   Sodium 135 - 145 mmol/L 138  140  131   Potassium 3.5 - 5.1 mmol/L 5.0  4.4  5.1   Chloride 98 - 111 mmol/L 105  106  97   CO2 22 - 32 mmol/L 27  25  22    Calcium 8.9 - 10.3 mg/dL 9.4  9.4  9.8   Total Protein 6.5 - 8.1 g/dL 8.0     Total Bilirubin 0.0 - 1.2 mg/dL 0.8     Alkaline Phos 38 - 126 U/L 104     AST 15 - 41 U/L 21     ALT 0 - 44 U/L 19      . Lab Results  Component Value Date   LDH 144 06/16/2020    SURGICAL PATHOLOGY  CASE: [id number]  PATIENT: [facility]  Surgical Pathology Report   Clinical History: right posterior shoulder soft tissue mass (cm)   FINAL MICROSCOPIC DIAGNOSIS:   A. SOFT TISSUE MASS, RIGHT POSTERIOR SHOULDER, NEEDLE CORE BIOPSY:  -  Malignant melanoma  -  See comment   COMMENT:   By immunohistochemistry, the neoplastic cells are positive for S100,  HMB45, Sox 10 and Melan-A (patchy) but negative for CD45, CD138,  cytokeratin 7, cytokeratin 20, cytokeratin AE1/3, desmin and EMA.  The  morphology and immunophenotype are consistent with malignant melanoma.  Dr. Candise Che was notified of these results on November 24, 2020.  Dr. Luisa Hart  reviewed the case and agrees with the above diagnosis.    RADIOGRAPHIC STUDIES: I have personally reviewed the radiological images as listed and agreed with the findings in the report. CT Soft  Tissue Neck W Contrast Result Date: 06/23/2023 CLINICAL DATA:  Metastatic melanoma. EXAM: CT NECK WITH CONTRAST TECHNIQUE: Multidetector CT imaging of the neck was performed using the standard protocol following the bolus administration of intravenous contrast. RADIATION DOSE REDUCTION: This exam was performed according to the departmental dose-optimization program which includes automated exposure control, adjustment of the mA and/or kV according to patient size and/or use of iterative reconstruction technique. CONTRAST:  OMNIPAQUE IOHEXOL 300 MG/ML  SOLN COMPARISON:  11/10/2022 FINDINGS: Pharynx and larynx: No evidence of mass or inflammation. Salivary glands: Normal Thyroid: Stable subcentimeter nodules, attention on follow-up. Lymph nodes: 2 cystic masses along the right jugular chain with nodule in septation affecting both, present since initial imaging in 2022 with no PET activity, 3.2 cm at the level of the right hyoid and 3.9 cm length in the level 4 neck. No newly enlarged lymph nodes. Vascular: No significant finding when compared to prior Limited intracranial: Negative Visualized orbits: Negative Mastoids and visualized paranasal sinuses: Mucosal thickening along the floor of the right maxillary sinus, possibly odontogenic given adjacent carious molar with sclerosis. Skeleton: C5-6 disc degeneration with right foraminal impingement. Upper chest: Reported separately IMPRESSION: Complex cystic lesions in the right lateral neck remain size stable since most recent PET when they were not hypermetabolic. No new or concerning finding. Electronically Signed   By: Tiburcio Pea M.D.   On: 06/23/2023 06:59   CT CHEST ABDOMEN PELVIS W CONTRAST Result Date: 06/13/2023 CLINICAL DATA:  History of recurrent metastatic melanoma of torso, monitor. * Tracking Code: BO *. EXAM: CT CHEST, ABDOMEN,  AND PELVIS WITH CONTRAST TECHNIQUE: Multidetector CT imaging of the chest, abdomen and pelvis was performed following  the standard protocol during bolus administration of intravenous contrast. RADIATION DOSE REDUCTION: This exam was performed according to the departmental dose-optimization program which includes automated exposure control, adjustment of the mA and/or kV according to patient size and/or use of iterative reconstruction technique. CONTRAST:  OMNIPAQUE IOHEXOL 300 MG/ML  SOLN COMPARISON:  Multiple priors including CT November 10, 2022 FINDINGS: CT CHEST FINDINGS Cardiovascular: Normal caliber thoracic aorta. No central pulmonary embolus on this nondedicated study. Normal size heart. No significant pericardial effusion/thickening. Mediastinum/Nodes: No suspicious thyroid nodule. Well-circumscribed fluid density lesion in the low right jugular sheath which effaces and compresses the jugular vein measures 3.8 cm on image 3/2 previously 3.3 cm common not hypermetabolic on prior PET-CT and favored a lymphangioma. No pathologically enlarged mediastinal, hilar, axillary or subpectoral lymph nodes. Lungs/Pleura: 3 mm pulmonary nodule along the right minor fissure on image 97/6 is unchanged in size. No new suspicious pulmonary nodules or masses. No pleural effusion. No pneumothorax. Musculoskeletal: Surgical clips and scarring superficial to the right scapula on image 19/2 is similar prior examinations. No aggressive lytic or blastic lesion of bone. CT ABDOMEN PELVIS FINDINGS Hepatobiliary: No suspicious hepatic lesion. Gallbladder is unremarkable. No biliary ductal dilation. Pancreas: No pancreatic ductal dilation or evidence of acute inflammation. Spleen: No splenomegaly. Adrenals/Urinary Tract: Bilateral adrenal glands appear normal. No hydronephrosis. Right cortical renal scarring. Kidneys demonstrate symmetric enhancement. 3 mm left lower pole nonobstructive renal stone. Mild symmetric wall thickening of a nondistended urinary bladder. Stomach/Bowel: No radiopaque enteric contrast material was administered. Stomach is  minimally distended limiting evaluation. No pathologic dilation of small or large bowel. Normal appendix. No evidence of acute bowel inflammation. Vascular/Lymphatic: Aortic atherosclerosis. Normal caliber abdominal aorta. Smooth IVC contours. No pathologically enlarged abdominal or pelvic lymph nodes. Reproductive: Prostate gland is unremarkable. Other: No significant abdominopelvic free fluid. Musculoskeletal: No aggressive lytic or blastic lesion of bone. Multilevel degenerative change of the spine. IMPRESSION: 1. No convincing evidence of metastatic disease in the chest, abdomen or pelvis. 2. Similar postsurgical change superficial to the right scapula. 3. Mild symmetric wall thickening of a nondistended urinary bladder, correlate with urinalysis to exclude cystitis. 4.  Aortic Atherosclerosis (ICD10-I70.0). Electronically Signed   By: Maudry Mayhew M.D.   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  Unclear primary site with the patient presented with a metastatic deposit in the soft tissue/skin over the right posterior shoulder. Patient also has multiple slightly hypermetabolic lymph nodes-right axillary, subpectoral and right cervical.  These could be from metastatic melanoma or a possible indolent lymphoma related to his IgM kappa monoclonal paraproteinemia. 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  GI panel and C. difficile testing negative.  Colonoscopy with biopsy rule out any colitis or microscopic colitis.  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 Leonides Schanz from GI.  PLAN: -Discussed lab results from today, 06/28/2023, in detail with the patient. CBC stable. CMP stable.  -Discussed CT Chest Abdomen Pelvis results from 06/13/2023. Showed No convincing evidence  of metastatic disease in the chest, abdomen or pelvis/ Similar postsurgical change superficial to the right scapula. Mild symmetric wall thickening of a nondistended urinary bladder, correlate with urinalysis to exclude cystitis. -Discussed CT Soft tissue results from 06/13/2023 in detail. Showed Complex cystic lesions in the right lateral neck remain size stable since most recent PET when they were not hypermetabolic. No new or concerning finding. -Recommend to follow-up with PCP regarding Prostate.  -Discussed the option of getting referred to a Otorhinolaryngologist to get the cystic lesion evaluated. Patient agrees.  -Will refer the patient to Otorhinolaryngologist.  -Continue to follow-up with his Surgeon, dermatologist, and Endocrinologist.  -Answered all of patient's questions.   FOLLOW UP: Referral to ENT Dr Jenne Pane for evaluation of rt neck cystic masses RTC with Dr Candise Che with labs in 6 months   The total time spent in the appointment was 30 minutes* .  All of the patient's questions were answered with apparent satisfaction. The patient knows to call the clinic with any problems, questions or concerns.   Wyvonnia Lora MD MS AAHIVMS [facility] Hematology/Oncology Physician [facility]  .*Total Encounter Time as defined by the Centers for Medicare and Medicaid Services includes, in addition to the face-to-face time of a patient visit (documented in the note above) non-face-to-face time: obtaining and reviewing outside history, ordering and reviewing medications, tests or procedures, care coordination (communications with other health care professionals or caregivers) and documentation in the medical record.   I,Param Shah,acting as  a Neurosurgeon for Wyvonnia Lora, MD.,have documented all relevant documentation on the behalf of Wyvonnia Lora, MD,as directed by  Wyvonnia Lora, MD while in the presence of Wyvonnia Lora, MD.  .I have reviewed the above documentation for accuracy and completeness, and I agree with the above. Johney Maine MD

## 2023-06-29 ENCOUNTER — Telehealth: Payer: Self-pay | Admitting: Hematology

## 2023-06-29 NOTE — Telephone Encounter (Signed)
Insurance states that they can not take PA over the phone. We walked through the PA process together so I could explain how it is getting converted. Insurance asked that I submit a blank fax request. I have submitted this information with chart notes and labs and marked for urgent review.

## 2023-06-29 NOTE — Telephone Encounter (Signed)
I spoke with patient spouse and she feels that something is not been done correctly and would like to speak with  the manager regarding this process. I have sent a message to Marin Olp for the PA team to reach out and also my practice admin Synetta Shadow to see if we can get this resolved for the patient.

## 2023-06-29 NOTE — Telephone Encounter (Signed)
PA as been cancelled again stating that they do not cover Androgel. I am submitting the generic, but for some reason, they continue to convert back to the brand name after submission.   I am going to try one more time to submit over the phone and will update.

## 2023-06-29 NOTE — Telephone Encounter (Signed)
Spoke with patient confirming upcoming appointment

## 2023-06-29 NOTE — Telephone Encounter (Signed)
Received another fax for additional information. Completed and faxed back

## 2023-06-30 ENCOUNTER — Other Ambulatory Visit (HOSPITAL_COMMUNITY): Payer: Self-pay

## 2023-06-30 NOTE — Telephone Encounter (Signed)
Called patient to discuss PA issues with plan. Advised [employer] has cancelled out 3 PA requests since January. We have escalated to the plan that this is a delay in patient care. A fax form was sent to plan again on 2/12 and was confirmed to be in process. We were advised we should get a determination by tomorrow. And our pharmacist is prepared to submit an urgent appeal if needed.

## 2023-07-01 ENCOUNTER — Telehealth: Payer: Self-pay

## 2023-07-01 DIAGNOSIS — E291 Testicular hypofunction: Secondary | ICD-10-CM

## 2023-07-01 NOTE — Telephone Encounter (Signed)
Pharmacy Patient Advocate Encounter  Received notification from [employer] that Prior Authorization for Testosterone has been DENIED.  Full denial letter will be uploaded to the media tab. See denial reason below.  Reason not given. Information has been sent to pharmacist for Urgent appeal.

## 2023-07-01 NOTE — Telephone Encounter (Signed)
Per patient's spouse she was told by [facility] that all was needed was the generic of the Testerone and it would be covered. RN will send MD on call message regarding request.

## 2023-07-04 ENCOUNTER — Telehealth: Payer: Self-pay | Admitting: Pharmacist

## 2023-07-04 ENCOUNTER — Other Ambulatory Visit: Payer: Self-pay

## 2023-07-04 ENCOUNTER — Other Ambulatory Visit (HOSPITAL_COMMUNITY): Payer: Self-pay

## 2023-07-04 MED ORDER — TESTOSTERONE 25 MG/2.5GM (1%) TD GEL: 25.0000 mg | Freq: Every day | TRANSDERMAL | 0 refills | Status: DC

## 2023-07-04 NOTE — Addendum Note (Signed)
 Addended by: Wenceslaus Gist, [place] on: 07/04/2023 09:23 AM   Modules accepted: Orders

## 2023-07-04 NOTE — Telephone Encounter (Signed)
 Sent!

## 2023-07-04 NOTE — Addendum Note (Signed)
 Addended by: Lamae Fosco, [place] on: 07/04/2023 12:42 PM   Modules accepted: Orders

## 2023-07-04 NOTE — Telephone Encounter (Signed)
 I sent generic testosterone gel to [facility].  I do not see [facility] pharmacy listed on patient's chart.  Please let me know if we need to send to a different pharmacy.

## 2023-07-04 NOTE — Telephone Encounter (Signed)
 Sent prescription for generic testosterone gel requested by patient.

## 2023-07-04 NOTE — Telephone Encounter (Signed)
 Spoke with [employer] today and they were unable to provided the reason for denial.  They have sent this back to their PA department for clarification, once resolved, they will update the letter and fax this to [place].  It can take 24-72 hours.  Once this is received, an appeal can be completed.  Thank you, Dellie Burns, PharmD Clinical Pharmacist  [place]  Direct Dial: [phone]

## 2023-07-05 ENCOUNTER — Other Ambulatory Visit (HOSPITAL_COMMUNITY): Payer: Self-pay

## 2023-07-05 NOTE — Telephone Encounter (Signed)
 Pharmacy Patient Advocate Encounter  Received notification from [employer] that Prior Authorization for Testosterone 1% (25mg /2.5G) pack has been APPROVED from 07/04/23 to 07/03/25. Ran test claim, Copay is $10 for 1 month. This test claim was processed through [facility]- copay amounts may vary at other pharmacies due to pharmacy/plan contracts, or as the patient moves through the different stages of their insurance plan.   PA #/Case ID/Reference #: [id number]

## 2023-07-11 ENCOUNTER — Telehealth: Payer: Self-pay

## 2023-07-11 ENCOUNTER — Other Ambulatory Visit: Payer: Self-pay

## 2023-07-11 DIAGNOSIS — E291 Testicular hypofunction: Secondary | ICD-10-CM

## 2023-07-11 MED ORDER — TESTOSTERONE 25 MG/2.5GM (1%) TD GEL: TRANSDERMAL | 0 refills | Status: DC

## 2023-07-11 NOTE — Telephone Encounter (Signed)
 Pharmacy called requesting clarification on the form (gel packets or pump), as well as clarification on the instructions/directions.

## 2023-07-11 NOTE — Telephone Encounter (Signed)
 Prescription resent

## 2023-07-26 ENCOUNTER — Ambulatory Visit: Payer: BC Managed Care – PPO | Admitting: "Endocrinology

## 2023-08-08 ENCOUNTER — Ambulatory Visit: Payer: Self-pay | Admitting: Family Medicine

## 2023-08-08 ENCOUNTER — Ambulatory Visit: Admitting: Internal Medicine

## 2023-08-08 ENCOUNTER — Ambulatory Visit: Admitting: "Endocrinology

## 2023-08-08 NOTE — Telephone Encounter (Signed)
 I don't see where he scheduled any appointment for ear pain initially.  If it is tooth pain, it sounds like the dentist is the right place to go.  I can't do anything for him until I evaluate him.

## 2023-08-08 NOTE — Telephone Encounter (Signed)
 Copied from CRM [id number]. Topic: Clinical - Red Word Triage >> Aug 08, 2023 12:50 PM Elle L wrote: Red Word that prompted transfer to Nurse Triage: George West's wife, George West, states that she scheduled an appointment for George West for ear pain and pressure. However, George West states George pain started in his tooth a few days ago and she is requesting to see what they should do.   Chief Complaint: tight upper molar tooth ache Symptoms: interfering with eating  Frequency: 3 days Pertinent Negatives: West denies earache Disposition: [] ED /[] Urgent Care (no appt availability in office) / [] Appointment(In office/virtual)/ []  [place] [facility]/ [x] Home Care/ [] Refused Recommended Disposition /[] [facility]/ []  Follow-up with PCP Additional Notes: pt wife to call and set up appt with dentist  Reason for Disposition  Toothache present > 24 hours  Answer Assessment - Initial Assessment Questions 1. LOCATION: "Which tooth is hurting?"  (e.g., right-side/left-side, upper/lower, front/back)     Right upper molar has a  crack 2. ONSET: "When did George toothache start?"  (e.g., hours, days)      3 days ago  3. SEVERITY: "How bad is George toothache?"  (Scale 1-10; mild, moderate or severe)   - MILD (1-3): doesn't interfere with chewing    - MODERATE (4-7): interferes with chewing, interferes with normal activities, awakens from sleep     - SEVERE (8-10): unable to eat, unable to do any normal activities, excruciating pain        Mild  4. SWELLING: "Is there any visible swelling of your face?"     no 5. OTHER SYMPTOMS: "Do you have any other symptoms?" (e.g., fever)     No  Protocols used: Toothache-A-AH

## 2023-08-09 ENCOUNTER — Telehealth: Payer: Self-pay | Admitting: *Deleted

## 2023-08-09 NOTE — Telephone Encounter (Signed)
 Tried to call to inform of below, did not leave a message due to message having different name.  Looks like he was scheduled for an acute visit but did not go to it.

## 2023-08-09 NOTE — Telephone Encounter (Signed)
 Copied from CRM [id number]. Topic: General - Other >> Aug 09, 2023 12:21 PM Ja-Kwan M wrote: Reason for CRM: Patient wife returned call to the office to advise that the patient has an appointment with a dentist so the matter has been taken care of.

## 2023-08-12 ENCOUNTER — Encounter: Payer: Self-pay | Admitting: "Endocrinology

## 2023-08-15 ENCOUNTER — Telehealth: Payer: Self-pay

## 2023-08-15 ENCOUNTER — Telehealth: Payer: Self-pay | Admitting: "Endocrinology

## 2023-08-15 ENCOUNTER — Other Ambulatory Visit: Payer: Self-pay | Admitting: "Endocrinology

## 2023-08-15 ENCOUNTER — Other Ambulatory Visit (HOSPITAL_COMMUNITY): Payer: Self-pay

## 2023-08-15 DIAGNOSIS — R7989 Other specified abnormal findings of blood chemistry: Secondary | ICD-10-CM

## 2023-08-15 MED ORDER — TESTOSTERONE CYPIONATE 200 MG/ML IM SOLN: 50.0000 mg | INTRAMUSCULAR | 0 refills | Status: DC

## 2023-08-15 NOTE — Telephone Encounter (Signed)
 Pt need new PA done for testosterone. Please mark has urgent for the insurance so the insurance company can process it quickly.

## 2023-08-15 NOTE — Telephone Encounter (Signed)
 See other encounter.

## 2023-08-15 NOTE — Telephone Encounter (Signed)
 Called pt to informed him that he needed to schedule an appt with the lab and to see DR [facility]. Spoke with pt wife. Pt wife informed me that he has an appt scheduled to get lab work done but he is currently out of testosterone. He has a refill already but was unable to get it fill due to the pharmacy not having any in stock. Pt wife would like to know if Dr Roosevelt Locks would [facility]?

## 2023-08-15 NOTE — Telephone Encounter (Signed)
 Pt informed

## 2023-08-15 NOTE — Telephone Encounter (Signed)
 Patient's wife, Elease Hashimoto, scheduled lab appointment for tomorrow for patient.  Elease Hashimoto would like to know if the testosterone (In another form) can be prescribed as soon as possible as the patient took his last dose this past Friday, 08/12/2023 and is showing improvement.

## 2023-08-16 ENCOUNTER — Other Ambulatory Visit

## 2023-08-16 ENCOUNTER — Other Ambulatory Visit (HOSPITAL_COMMUNITY): Payer: Self-pay

## 2023-08-17 ENCOUNTER — Other Ambulatory Visit: Payer: Self-pay | Admitting: "Endocrinology

## 2023-08-17 ENCOUNTER — Other Ambulatory Visit (HOSPITAL_COMMUNITY): Payer: Self-pay

## 2023-08-17 MED ORDER — TESTOSTERONE CYPIONATE 200 MG/ML IM SOLN: 50.0000 mg | INTRAMUSCULAR | 0 refills | Status: DC

## 2023-08-18 ENCOUNTER — Other Ambulatory Visit (HOSPITAL_COMMUNITY): Payer: Self-pay

## 2023-08-18 ENCOUNTER — Telehealth: Payer: Self-pay

## 2023-08-18 NOTE — Telephone Encounter (Signed)
 Pharmacy Patient Advocate Encounter   Received notification from Pt Calls Messages that prior authorization for Testosterone inj is required/requested.   Insurance verification completed.   The patient is insured through [facility] .   Per test claim: PA required; PA submitted to above mentioned insurance via Prompt PA Key/confirmation #[id number] - Status is pending

## 2023-08-19 ENCOUNTER — Other Ambulatory Visit

## 2023-08-22 DIAGNOSIS — H2513 Age-related nuclear cataract, bilateral: Secondary | ICD-10-CM | POA: Diagnosis not present

## 2023-08-22 DIAGNOSIS — H35013 Changes in retinal vascular appearance, bilateral: Secondary | ICD-10-CM | POA: Diagnosis not present

## 2023-08-30 NOTE — Telephone Encounter (Signed)
 Pharmacy Patient Advocate Encounter  Received notification from [employer] that Prior Authorization for Testosterone inj has been APPROVED from 08-18-2023 to 08-16-2025

## 2023-09-06 ENCOUNTER — Other Ambulatory Visit

## 2023-09-06 DIAGNOSIS — E291 Testicular hypofunction: Secondary | ICD-10-CM | POA: Diagnosis not present

## 2023-09-06 DIAGNOSIS — R7989 Other specified abnormal findings of blood chemistry: Secondary | ICD-10-CM | POA: Diagnosis not present

## 2023-09-06 LAB — TESTOSTERONE TOTAL,FREE,BIO, MALES
Albumin: 4.1 g/dL (ref 3.6–5.1)
Sex Hormone Binding: 58 nmol/L (ref 22–77)
Testosterone, Bioavailable: 192 ng/dL (ref 110.0–575.0)
Testosterone, Free: 102 pg/mL (ref 46.0–224.0)
Testosterone: 1066 ng/dL — ABNORMAL HIGH (ref 250–827)

## 2023-09-06 LAB — PSA: PSA: 4.12 ng/mL — ABNORMAL HIGH (ref <=4.00)

## 2023-09-13 ENCOUNTER — Encounter: Payer: Self-pay | Admitting: "Endocrinology

## 2023-09-13 ENCOUNTER — Ambulatory Visit (INDEPENDENT_AMBULATORY_CARE_PROVIDER_SITE_OTHER): Admitting: "Endocrinology

## 2023-09-13 VITALS — BP 116/80 | HR 71 | Ht 72.0 in | Wt 183.0 lb

## 2023-09-13 DIAGNOSIS — E291 Testicular hypofunction: Secondary | ICD-10-CM

## 2023-09-13 DIAGNOSIS — E2740 Unspecified adrenocortical insufficiency: Secondary | ICD-10-CM | POA: Diagnosis not present

## 2023-09-13 DIAGNOSIS — R7989 Other specified abnormal findings of blood chemistry: Secondary | ICD-10-CM | POA: Diagnosis not present

## 2023-09-13 MED ORDER — HYDROCORTISONE 5 MG PO TABS: ORAL_TABLET | ORAL | 1 refills | Status: DC

## 2023-09-13 MED ORDER — TESTOSTERONE 1.62 % TD GEL: 0.5000 | Freq: Every day | TRANSDERMAL | 2 refills | Status: DC

## 2023-09-13 NOTE — Progress Notes (Signed)
 Jorge Newcomer, MD 09/13/23   Patient ID: Suella Emmer, male   DOB: [birth year], 63 y.o.   MRN: [id number]    Chief complaint: Followup of adrenal insufficiency  History of Present Illness:    Recent history: Currently taking Cortef  15 mg at 7 am and 5 mg at 1 pm - feels better No passing out episodes since 05/2023  No history of heart attack/stroke/urine issues 04/26/23: Patient has primary hypogonadism confirmed by low free testosterone  with high LH, which produces more estrogen, boosting SHBG, pseudo normalizing the total testosterone . Interested in starting testosterone  treatment for primary hypogonadism 04/28/23 MRI brain W/WO reported : "The pituitary gland is normal in size with a concave upper margin. The infundibulum is midline. The gland enhances in a homogeneous fashion. No evidence of pituitary adenoma. I think the findings on the previous study were due to partial volume averaging with the cavernous sinuses."    Patient has a tendency to change doses or dose timings or stops taking medication to feel "the kick" on resuming the steroid pills as he thinks he has developed tolerance to steroids  Not taking florinef  0.1 mg half tablet alternate days - due to dizziness Previously had another episode of fully passing out for about 4 min, no with "pulse", relative was EMT and was going to start CPR but he got self revived, was pale, drenched in sweat from head to thighs but did not froth/urinate. BP at hour ago was on lower side of 107 SBP with normal HR. BG was not checked.  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 after treatment with ipilimumab  His main symptom was weight loss, tiredness, weakness and lightheadedness along with passing out starting in January 2023 This was diagnosed by his oncologist Baseline cortisol level was 1.1 and ACTH  level was undetectable in 1/23 Although initially was treated with hydrocortisone  and continued to have  some syncope and Florinef  was added but this was stopped subsequently when blood pressure went up  Because of low normal standing blood pressures in 8/23 he was started on Florinef  3 days a week and switched from prednisone  to hydrocortisone  20 mg in the morning and 10 mg in the evening  He had stopped Florinef  for a second time because of rising blood pressure up to 164 and symptoms of throbbing in the head and eye pain  On his last visit blood pressure readings had been around 100 systolic at home and he was having significant decreased appetite, feeling lightheaded and feeling like passing out This improved significantly after doubling his hydrocortisone  for a week Previously when he went back to the regimen of 20 mg hydrocortisone  in the morning and 10 in the afternoon he started getting fatigued and drowsy in the early afternoon With this he started taking an extra half a tablet of Cortef  around 1 PM with improvement in symptoms but felt same in office subsequently  He says he is getting the brand-name Cortef   04/2021 MRI HEAD WITHOUT AND WITH CONTRAST: Questionable pituitary MRI showed 4 mm nodule 02/2022 MRI HEAD WITHOUT AND WITH CONTRAST: Stable hypoenhancing area on the left side of the pituitary. This may represent a pituitary microadenoma    Lab Results  Component Value Date   CRTSLPL 1.1 (L) 04/18/2023   CRTSLPL 6.5 06/23/2021   CRTSLPL 0.7 06/23/2021   CRTSLPL 1.1 05/22/2021    PITUITARY functions:  At baseline he had the following labs Prolactin: borderline high.   Total testosterone  WNL  THYROID :  TSH level  was suppressed in 11/22 and free T4 was above normal only transiently in 1/23 at 1.82        He was given methimazole  for short time but this was stopped when TSH went up higher at 11  Wt Readings from Last 3 Encounters:  09/13/23 183 lb (83 kg)  06/28/23 189 lb 1.6 oz (85.8 kg)  05/19/23 189 lb (85.7 kg)   Lab Results  Component Value Date   TSH 1.965  06/28/2023   TSH 1.247 03/21/2023   TSH 1.930 10/19/2022   FREET4 1.3 04/18/2023   FREET4 1.14 09/22/2022   FREET4 0.94 07/28/2022   Component     Latest Ref Rng 06/23/2021 12/15/2021 07/28/2022  Testosterone      300.00 - 890.00 ng/dL 161  096.04  540.98      No visits with results within 1 Week(s) from this visit.  Latest known visit with results is:  Orders Only on 08/15/2023  Component Date Value Ref Range Status   PSA 09/06/2023 4.12 (H)  < OR = 4.00 ng/mL Final   Comment: The total PSA value from this assay system is  standardized against the WHO standard. The test  result will be approximately 20% lower when compared  to the equimolar-standardized total PSA (Beckman  Coulter). Comparison of serial PSA results should be  interpreted with this fact in mind. . This test was performed using the Siemens  chemiluminescent method. Values obtained from  different assay methods cannot be used interchangeably. PSA levels, regardless of value, should not be interpreted as absolute evidence of the presence or absence of disease.    Testosterone  09/06/2023 1,066 (H)  250 - 827 ng/dL Final   Albumin 11/91/4782 4.1  3.6 - 5.1 g/dL Final   Sex Hormone Binding 09/06/2023 58  22 - 77 nmol/L Final   Testosterone , Free 09/06/2023 102.0  46.0 - 224.0 pg/mL Final   Testosterone , Bioavailable 09/06/2023 192.0  110.0 - 575.0 ng/dL Final    Allergies as of 09/13/2023       Reactions   Duloxetine     dizziness        Medication List        Accurate as of September 13, 2023 11:03 AM. If you have any questions, ask your nurse or doctor.          STOP taking these medications    testosterone  cypionate 200 MG/ML injection Commonly known as: DEPOTESTOSTERONE CYPIONATE Stopped by: Gearald Stonebraker       TAKE these medications    albuterol  108 (90 Base) MCG/ACT inhaler Commonly known as: VENTOLIN  HFA Inhale 2 puffs into the lungs every 4 (four) hours as needed for wheezing or shortness  of breath.   Blood Glucose Monitoring Suppl Devi 1 each by Does not apply route in the morning, at noon, and at bedtime. May substitute to any manufacturer covered by patient's insurance.   hydrocortisone  5 MG tablet Commonly known as: CORTEF  Take 3 tablets (15 mg total) by mouth in the morning AND 1 tablet (5 mg total) every evening. What changed:  medication strength See the new instructions. Changed by: Fabyan Loughmiller   Testosterone  1.62 % Gel Place 0.5 Pump onto the skin daily. Started by: Muskaan Smet   Trelegy Ellipta  100-62.5-25 MCG/ACT Aepb Generic drug: Fluticasone -Umeclidin-Vilant INHALE 1 PUFF INTO THE LUNGS ONCE DAILY        Allergies:  Allergies  Allergen Reactions   Duloxetine      dizziness    Past Medical History:  Diagnosis  Date   Adrenal insufficiency (HCC)    Anxiety 01/01/20   Cataract 01/2023   COPD (chronic obstructive pulmonary disease) (HCC)    Depression    Dyspnea    with 77ft of ambulation. 200 feet if he walks slow.   Emphysema of lung (HCC) 01/01/20   Fatty liver    History of kidney stones    seen on CT Scan   Neuromuscular disorder (HCC) 01/01/20   Skin cancer (melanoma) (HCC)    Substance abuse (HCC)    Alcohol   Thyroid  disease     Past Surgical History:  Procedure Laterality Date   MASS EXCISION Right 12/15/2020   Procedure: EXCISION OF MALIGNANT MASS RIGHT POSTERIOR SHOULDER;  Surgeon: Lockie Rima, MD;  Location: [facility] OR;  Service: General;  Laterality: Right;   WISDOM TOOTH EXTRACTION      Family History  Problem Relation Age of Onset   Stroke Mother    Dementia Mother    Heart disease Mother    Breast cancer Sister    Thyroid  cancer Sister    Ovarian cancer Sister    Stroke Maternal Grandmother    Heart disease Maternal Grandmother    Breast cancer Paternal Grandmother    Cancer Paternal Grandmother    Diabetes Paternal Aunt    Diabetes Paternal Uncle    Hypertension Daughter    Other Daughter        Uterine  cysts   Diabetes Maternal Aunt    Thyroid  disease Neg Hx     Social History:  reports that he has been smoking cigarettes and cigars. He started smoking about 42 years ago. He has a 21.2 pack-year smoking history. He has never used smokeless tobacco. He reports current alcohol use of about 32.0 standard drinks of alcohol per week. He reports that he does not use drugs.  Review of Systems  Same as above   Examination:   BP 116/80   Pulse 71   Ht 6' (1.829 m)   Wt 183 lb (83 kg)   SpO2 97%   BMI 24.82 kg/m   Physical Exam Constitutional:      Appearance: Normal appearance. Not ill-appearing.  HENT:     Head: Normocephalic and atraumatic.  Eyes:     General: No scleral icterus.    Conjunctiva/sclera: Conjunctivae normal.  Pulmonary:     Effort: Pulmonary effort is normal. No respiratory distress.  Musculoskeletal:     Cervical back: Normal range of motion. No rigidity.  Genitourinary exam: testes WNL, volume estimated 20 ml B/L, no abnormalities/tenderness/mass notes  Skin:    Coloration: Skin is not jaundiced.     Findings: No rash.  Neurological:     Mental Status: Alert and oriented to person, place, and time. Psychiatric:        Behavior: Behavior normal.        Judgment: Judgment normal.   Assessment/Plan:   Diagnoses and all orders for this visit:  Primary hypogonadism in male -     Testosterone  1.62 % GEL; Place 0.5 Pump onto the skin daily. -     Testosterone  Total,Free,Bio, Males -     PSA -     CBC with Differential/Platelet  Low testosterone  in male  Adrenal insufficiency (HCC) -     hydrocortisone  (CORTEF ) 5 MG tablet; Take 3 tablets (15 mg total) by mouth in the morning AND 1 tablet (5 mg total) every evening.   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 Continue current Cortef  15 mg at 7 am AND 5 mg at 1 pm, self stopped florinef  0.1 mg half tablet alternate days (improved salt by excess salt intake) Discussed previously the side effects of excessive hydrocortisone , patient agrees and feels that he has been dealing with the same including behavioral issues as well as skin thinning Patient finds no difference between brand Cortef  and generic hydrocortisone  Continue checking blood pressure at home, if blood pressure drops, try half a pill of 0.1 mg Florinef  as tolerated Reports already following with cardiac etiology and neurology in the past.   Passing out episodes likely unrelated to adrenal insufficiency as pt is adequately dosed with steroids (pt self discontinues florinef  ) Recommend incorporating water and protein drinks, avoid red meats/plain sugars/sugary beverages   Check orthostatic BP and HR when he feels episodes coming as well as checking BG Doubling up steroid dose of days of "impending episode"   04/26/23 Patient has primary hypogonadism confirmed by low free testosterone  with high LH, which produces more estrogen, boosting SHBG, pseudo normalizing the total testosterone . Was on Androgel  25 mg every day-not available in pharmacy, started 50 mg testosterone  cypionate every 2 weeks leading to elevated testosterone  level drawn 2 days after the shot, instructed patient to decrease the testosterone  cypionate dose to 40 mg until he starts on the 1.62% testosterone  gel half a pump once a day and repeat labs before next visit including PSA and CBC.  PSA went up by more than 1 point after patient started taking the testosterone  shot, if it stays elevated we will decrease/stop the testosterone  treatment and follow-up with urologist visit, discussed with patient and wife, understand and agreed Discussed risks and S/E  04/2021 MRI HEAD  WITHOUT AND WITH CONTRAST: Questionable pituitary MRI showed 4 mm nodule 02/2022 MRI HEAD WITHOUT AND WITH CONTRAST: Stable hypoenhancing area on the left side of the pituitary. This may represent a pituitary microadenoma 04/28/23: 02/2022 MRI HEAD WITHOUT AND WITH CONTRAST The pituitary gland is normal in size with a concave upper margin. The infundibulum is midline. The gland enhances in a homogeneous fashion. No evidence of pituitary adenoma. I think the findings on the previous study were due to partial volume averaging with the cavernous sinuses. No further MRI/pituitary work up needed   Return in about 7 weeks (around 11/01/2023) for visit + labs before next visit.   Huxton Glaus 09/13/2023, 11:03 AM

## 2023-11-03 ENCOUNTER — Other Ambulatory Visit

## 2023-11-03 DIAGNOSIS — E291 Testicular hypofunction: Secondary | ICD-10-CM | POA: Diagnosis not present

## 2023-11-03 DIAGNOSIS — R7989 Other specified abnormal findings of blood chemistry: Secondary | ICD-10-CM | POA: Diagnosis not present

## 2023-11-04 LAB — CBC WITH DIFFERENTIAL/PLATELET
Absolute Lymphocytes: 2978 {cells}/uL (ref 850–3900)
Absolute Monocytes: 569 {cells}/uL (ref 200–950)
Basophils Absolute: 51 {cells}/uL (ref 0–200)
Basophils Relative: 0.7 %
Eosinophils Absolute: 307 {cells}/uL (ref 15–500)
Eosinophils Relative: 4.2 %
HCT: 47.8 % (ref 38.5–50.0)
Hemoglobin: 16.1 g/dL (ref 13.2–17.1)
MCH: 33.3 pg — ABNORMAL HIGH (ref 27.0–33.0)
MCHC: 33.7 g/dL (ref 32.0–36.0)
MCV: 99 fL (ref 80.0–100.0)
MPV: 11.5 fL (ref 7.5–12.5)
Monocytes Relative: 7.8 %
Neutro Abs: 3395 {cells}/uL (ref 1500–7800)
Neutrophils Relative %: 46.5 %
Platelets: 201 10*3/uL (ref 140–400)
RBC: 4.83 10*6/uL (ref 4.20–5.80)
RDW: 12.2 % (ref 11.0–15.0)
Total Lymphocyte: 40.8 %
WBC: 7.3 10*3/uL (ref 3.8–10.8)

## 2023-11-04 LAB — TESTOSTERONE TOTAL,FREE,BIO, MALES
Albumin: 4.1 g/dL (ref 3.6–5.1)
Sex Hormone Binding: 41 nmol/L (ref 22–77)
Testosterone, Bioavailable: 91.1 ng/dL — ABNORMAL LOW (ref 110.0–575.0)
Testosterone, Free: 48.4 pg/mL (ref 46.0–224.0)
Testosterone: 431 ng/dL (ref 250–827)

## 2023-11-04 LAB — PSA: PSA: 3.45 ng/mL (ref <=4.00)

## 2023-11-08 ENCOUNTER — Encounter: Payer: Self-pay | Admitting: "Endocrinology

## 2023-11-08 ENCOUNTER — Ambulatory Visit (INDEPENDENT_AMBULATORY_CARE_PROVIDER_SITE_OTHER): Admitting: "Endocrinology

## 2023-11-08 VITALS — BP 120/78 | HR 65 | Ht 72.0 in | Wt 182.0 lb

## 2023-11-08 DIAGNOSIS — E291 Testicular hypofunction: Secondary | ICD-10-CM

## 2023-11-08 DIAGNOSIS — E2740 Unspecified adrenocortical insufficiency: Secondary | ICD-10-CM

## 2023-11-08 NOTE — Progress Notes (Signed)
 Obadiah Birmingham, MD 11/08/23   Patient ID: Aleene Lady, male   DOB: 1961-01-25, 63 y.o.   MRN: [id number]    Chief complaint: Followup of adrenal insufficiency  History of Present Illness:    Recent history: Currently taking Cortef  15 mg at 7 am and 5 mg at 1 pm  No passing out episodes since 05/2023  No history of heart attack/stroke/urine issues 04/26/23: Patient has primary hypogonadism confirmed by low free testosterone  with high LH, which produces more estrogen, boosting SHBG, pseudo normalizing the total testosterone . Interested in starting testosterone  treatment for primary hypogonadism 04/28/23 MRI brain W/WO reported : The pituitary gland is normal in size with a concave upper margin. The infundibulum is midline. The gland enhances in a homogeneous fashion. No evidence of pituitary adenoma. I think the findings on the previous study were due to partial volume averaging with the cavernous sinuses.    Previously, patient changed doses or dose timings or stopped taking medication to feel the kick on resuming the steroid pills as he thought he developed tolerance to steroids  Not taking florinef  0.1 mg half tablet alternate days - due to dizziness Previously had another episode of fully passing out for about 4 min, no with pulse, relative was EMT and was going to start CPR but he got self revived, was pale, drenched in sweat from head to thighs but did not froth/urinate. BP at hour ago was on lower side of 107 SBP with normal HR. BG was not checked.  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 after treatment with ipilimumab  His main symptom was weight loss, tiredness, weakness and lightheadedness along with passing out starting in January 2023 This was diagnosed by his oncologist Baseline cortisol level was 1.1 and ACTH  level was undetectable in 1/23 Although initially was treated with hydrocortisone  and continued to have some syncope and  Florinef  was added but this was stopped subsequently when blood pressure went up  Because of low normal standing blood pressures in 8/23 he was started on Florinef  3 days a week and switched from prednisone  to hydrocortisone  20 mg in the morning and 10 mg in the evening  He had stopped Florinef  for a second time because of rising blood pressure up to 164 and symptoms of throbbing in the head and eye pain  At one point, his blood pressure readings had been around 100 systolic at home and he was having significant decreased appetite, feeling lightheaded and feeling like passing out This improved significantly after doubling his hydrocortisone  for a week Previously when he went back to the regimen of 20 mg hydrocortisone  in the morning and 10 in the afternoon he started getting fatigued and drowsy in the early afternoon With this he started taking an extra half a tablet of Cortef  around 1 PM with improvement in symptoms but felt same in office subsequently  He says he is getting the brand-name Cortef   04/2021 MRI HEAD WITHOUT AND WITH CONTRAST: Questionable pituitary MRI showed 4 mm nodule 02/2022 MRI HEAD WITHOUT AND WITH CONTRAST: Stable hypoenhancing area on the left side of the pituitary. This may represent a pituitary microadenoma    Lab Results  Component Value Date   CRTSLPL 1.1 (L) 04/18/2023   CRTSLPL 6.5 06/23/2021   CRTSLPL 0.7 06/23/2021   CRTSLPL 1.1 05/22/2021    PITUITARY functions:  At baseline he had the following labs Prolactin: borderline high.   Total testosterone  WNL  THYROID :  TSH level was suppressed in 11/22 and free  T4 was above normal only transiently in 1/23 at 1.82        He was given methimazole  for short time but this was stopped when TSH went up higher at 11  Wt Readings from Last 3 Encounters:  11/08/23 182 lb (82.6 kg)  09/13/23 183 lb (83 kg)  06/28/23 189 lb 1.6 oz (85.8 kg)   Lab Results  Component Value Date   TSH 1.965 06/28/2023   TSH  1.247 03/21/2023   TSH 1.930 10/19/2022   FREET4 1.3 04/18/2023   FREET4 1.14 09/22/2022   FREET4 0.94 07/28/2022   Component     Latest Ref Rng 06/23/2021 12/15/2021 07/28/2022  Testosterone      300.00 - 890.00 ng/dL 518  608.78  625.70      No visits with results within 1 Week(s) from this visit.  Latest known visit with results is:  Office Visit on 09/13/2023  Component Date Value Ref Range Status   Testosterone  11/03/2023 431  250 - 827 ng/dL Final   Albumin 93/80/7974 4.1  3.6 - 5.1 g/dL Final   Sex Hormone Binding 11/03/2023 41  22 - 77 nmol/L Final   Testosterone , Free 11/03/2023 48.4  46.0 - 224.0 pg/mL Final   Testosterone , Bioavailable 11/03/2023 91.1 (L)  110.0 - 575.0 ng/dL Final   PSA 11/03/2023 3.45  < OR = 4.00 ng/mL Final   Comment: The total PSA value from this assay system is  standardized against the WHO standard. The test  result will be approximately 20% lower when compared  to the equimolar-standardized total PSA (Beckman  Coulter). Comparison of serial PSA results should be  interpreted with this fact in mind. . This test was performed using the Siemens  chemiluminescent method. Values obtained from  different assay methods cannot be used interchangeably. PSA levels, regardless of value, should not be interpreted as absolute evidence of the presence or absence of disease.    WBC 11/03/2023 7.3  3.8 - 10.8 Thousand/uL Final   RBC 11/03/2023 4.83  4.20 - 5.80 Million/uL Final   Hemoglobin 11/03/2023 16.1  13.2 - 17.1 g/dL Final   HCT 93/80/7974 47.8  38.5 - 50.0 % Final   MCV 11/03/2023 99.0  80.0 - 100.0 fL Final   MCH 11/03/2023 33.3 (H)  27.0 - 33.0 pg Final   MCHC 11/03/2023 33.7  32.0 - 36.0 g/dL Final   Comment: For adults, a slight decrease in the calculated MCHC value (in the range of 30 to 32 g/dL) is most likely not clinically significant; however, it should be interpreted with caution in correlation with other red cell parameters and the  patient's clinical condition.    RDW 11/03/2023 12.2  11.0 - 15.0 % Final   Platelets 11/03/2023 201  140 - 400 Thousand/uL Final   MPV 11/03/2023 11.5  7.5 - 12.5 fL Final   Neutro Abs 11/03/2023 3,395  1,500 - 7,800 cells/uL Final   Absolute Lymphocytes 11/03/2023 2,978  850 - 3,900 cells/uL Final   Absolute Monocytes 11/03/2023 569  200 - 950 cells/uL Final   Eosinophils Absolute 11/03/2023 307  15 - 500 cells/uL Final   Basophils Absolute 11/03/2023 51  0 - 200 cells/uL Final   Neutrophils Relative % 11/03/2023 46.5  % Final   Total Lymphocyte 11/03/2023 40.8  % Final   Monocytes Relative 11/03/2023 7.8  % Final   Eosinophils Relative 11/03/2023 4.2  % Final   Basophils Relative 11/03/2023 0.7  % Final    Allergies as of  11/08/2023       Reactions   Duloxetine     dizziness        Medication List        Accurate as of November 08, 2023  8:27 AM. If you have any questions, ask your nurse or doctor.          albuterol  108 (90 Base) MCG/ACT inhaler Commonly known as: VENTOLIN  HFA Inhale 2 puffs into the lungs every 4 (four) hours as needed for wheezing or shortness of breath.   Blood Glucose Monitoring Suppl Devi 1 each by Does not apply route in the morning, at noon, and at bedtime. May substitute to any manufacturer covered by patient's insurance.   hydrocortisone  5 MG tablet Commonly known as: CORTEF  Take 3 tablets (15 mg total) by mouth in the morning AND 1 tablet (5 mg total) every evening.   Testosterone  1.62 % Gel Place 0.5 Pump onto the skin daily.   Trelegy Ellipta  100-62.5-25 MCG/ACT Aepb Generic drug: Fluticasone -Umeclidin-Vilant INHALE 1 PUFF INTO THE LUNGS ONCE DAILY        Allergies:  Allergies  Allergen Reactions   Duloxetine      dizziness    Past Medical History:  Diagnosis Date   Adrenal insufficiency (HCC)    Anxiety 01/01/20   Cataract 01/2023   COPD (chronic obstructive pulmonary disease) (HCC)    Depression    Dyspnea    with  40ft of ambulation. 200 feet if he walks slow.   Emphysema of lung (HCC) 01/01/20   Fatty liver    History of kidney stones    seen on CT Scan   Neuromuscular disorder (HCC) 01/01/20   Skin cancer (melanoma) (HCC)    Substance abuse (HCC)    Alcohol   Thyroid  disease     Past Surgical History:  Procedure Laterality Date   MASS EXCISION Right 12/15/2020   Procedure: EXCISION OF MALIGNANT MASS RIGHT POSTERIOR SHOULDER;  Surgeon: Aron Shoulders, MD;  Location: [facility] OR;  Service: General;  Laterality: Right;   WISDOM TOOTH EXTRACTION      Family History  Problem Relation Age of Onset   Stroke Mother    Dementia Mother    Heart disease Mother    Breast cancer Sister    Thyroid  cancer Sister    Ovarian cancer Sister    Stroke Maternal Grandmother    Heart disease Maternal Grandmother    Breast cancer Paternal Grandmother    Cancer Paternal Grandmother    Diabetes Paternal Aunt    Diabetes Paternal Uncle    Hypertension Daughter    Other Daughter        Uterine cysts   Diabetes Maternal Aunt    Thyroid  disease Neg Hx     Social History:  reports that he has been smoking cigarettes and cigars. He started smoking about 42 years ago. He has a 21.2 pack-year smoking history. He has never used smokeless tobacco. He reports current alcohol use of about 32.0 standard drinks of alcohol per week. He reports that he does not use drugs.  Review of Systems  Same as above   Examination:   BP 120/78   Pulse 65   Ht 6' (1.829 m)   Wt 182 lb (82.6 kg)   SpO2 93%   BMI 24.68 kg/m   Physical Exam Constitutional:      Appearance: Normal appearance. Not ill-appearing.  HENT:     Head: Normocephalic and atraumatic.  Eyes:     General: No scleral icterus.  Conjunctiva/sclera: Conjunctivae normal.  Pulmonary:     Effort: Pulmonary effort is normal. No respiratory distress.  Musculoskeletal:     Cervical back: Normal range of motion. No rigidity.  Previously done Genitourinary exam:  testes WNL, volume estimated 20 ml B/L, no abnormalities/tenderness/mass notes  Skin:    Coloration: Skin is not jaundiced.     Findings: No rash.  Neurological:     Mental Status: Alert and oriented to person, place, and time. Psychiatric:        Behavior: Behavior normal.        Judgment: Judgment normal.   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 Continue current Cortef  15 mg at 7 am AND 5 mg at 1 pm, self stopped florinef  0.1 mg half tablet alternate days (improved salt by excess salt intake) Discussed previously the side effects of excessive hydrocortisone , patient agrees and feels that he has been dealing with the same including behavioral issues as well as skin thinning Patient finds no difference between brand Cortef  and generic hydrocortisone  Continue checking blood pressure at home, if blood pressure drops, try half a pill of 0.1 mg Florinef  as tolerated Reports already following with cardiac etiology and neurology in the past.   Passing out episodes likely unrelated to adrenal insufficiency as pt is adequately dosed with steroids (pt self discontinues florinef  ) Recommend incorporating water and protein drinks, avoid red meats/plain sugars/sugary beverages   Check orthostatic BP and HR when he feels episodes coming as well as checking BG Doubling up steroid dose of days of impending episode   04/26/23 Patient has primary hypogonadism confirmed by low free testosterone  with high LH, which produces more estrogen, boosting SHBG, pseudo normalizing the total  testosterone . Was on Androgel  25 mg every day-not available in pharmacy, started 50 mg testosterone  cypionate every 2 weeks leading to elevated testosterone  level drawn 2 days after the shot, instructed patient to decrease the testosterone  cypionate dose to 40 mg until he starts on the 1.62% testosterone  gel half a pump once a day and repeat labs before next visit including PSA and CBC.  PSA went up by more than 1 point after patient started taking the testosterone  shot, so we decreased testosterone  treatment  11/03/23: testosterone  is back down to normal total, with PSA down, HCT staying normal Continue current dose of half a pump a day of testosterone  (not abel to get gel packs/injections) Repeat labs before next visit  Discussed risks and S/E  04/2021 MRI HEAD WITHOUT AND WITH CONTRAST: Questionable pituitary MRI showed 4 mm nodule 02/2022 MRI HEAD WITHOUT AND WITH CONTRAST: Stable hypoenhancing area on the left side of the pituitary. This may represent a pituitary microadenoma 04/28/23: 02/2022 MRI HEAD WITHOUT AND WITH CONTRAST The pituitary gland is normal in size with a concave upper margin. The infundibulum is midline. The gland enhances in a homogeneous fashion. No evidence of pituitary adenoma. I think the findings on the previous study were due to partial volume averaging with the cavernous sinuses. No further MRI/pituitary work up needed   Return in about 3 months (around 02/08/2024).   Giada Schoppe 11/08/2023, 8:27 AM

## 2023-12-26 ENCOUNTER — Telehealth: Payer: Self-pay | Admitting: Hematology

## 2023-12-26 NOTE — Telephone Encounter (Signed)
 Rescheduled appointments per incoming call from the patients wife. Talked with Mrs.Eldred and she is aware of the changes made to the patients upcoming appointments.

## 2023-12-27 ENCOUNTER — Inpatient Hospital Stay: Payer: BC Managed Care – PPO | Admitting: Hematology

## 2023-12-27 ENCOUNTER — Inpatient Hospital Stay: Payer: BC Managed Care – PPO

## 2024-01-30 ENCOUNTER — Other Ambulatory Visit: Payer: Self-pay

## 2024-01-30 DIAGNOSIS — E271 Primary adrenocortical insufficiency: Secondary | ICD-10-CM

## 2024-01-30 DIAGNOSIS — R7989 Other specified abnormal findings of blood chemistry: Secondary | ICD-10-CM

## 2024-01-30 DIAGNOSIS — E2740 Unspecified adrenocortical insufficiency: Secondary | ICD-10-CM

## 2024-02-03 ENCOUNTER — Other Ambulatory Visit

## 2024-02-03 DIAGNOSIS — E271 Primary adrenocortical insufficiency: Secondary | ICD-10-CM | POA: Diagnosis not present

## 2024-02-03 DIAGNOSIS — R7989 Other specified abnormal findings of blood chemistry: Secondary | ICD-10-CM | POA: Diagnosis not present

## 2024-02-03 DIAGNOSIS — E291 Testicular hypofunction: Secondary | ICD-10-CM | POA: Diagnosis not present

## 2024-02-03 DIAGNOSIS — E2740 Unspecified adrenocortical insufficiency: Secondary | ICD-10-CM | POA: Diagnosis not present

## 2024-02-04 LAB — BASIC METABOLIC PANEL WITH GFR
BUN: 10 mg/dL (ref 7–25)
CO2: 21 mmol/L (ref 20–32)
Calcium: 9.3 mg/dL (ref 8.6–10.3)
Chloride: 104 mmol/L (ref 98–110)
Creat: 1.06 mg/dL (ref 0.70–1.35)
Glucose, Bld: 128 mg/dL — ABNORMAL HIGH (ref 65–99)
Potassium: 4.6 mmol/L (ref 3.5–5.3)
Sodium: 136 mmol/L (ref 135–146)
eGFR: 78 mL/min/1.73m2 (ref >=60)

## 2024-02-04 LAB — TESTOSTERONE TOTAL,FREE,BIO, MALES
Albumin: 4.1 g/dL (ref 3.6–5.1)
Sex Hormone Binding: 55 nmol/L (ref 22–77)
Testosterone, Bioavailable: 125.7 ng/dL (ref 110.0–575.0)
Testosterone, Free: 66.8 pg/mL (ref 46.0–224.0)
Testosterone: 722 ng/dL (ref 250–827)

## 2024-02-08 ENCOUNTER — Encounter: Payer: Self-pay | Admitting: "Endocrinology

## 2024-02-08 ENCOUNTER — Ambulatory Visit (INDEPENDENT_AMBULATORY_CARE_PROVIDER_SITE_OTHER): Admitting: "Endocrinology

## 2024-02-08 VITALS — BP 112/80 | HR 67 | Ht 72.0 in | Wt 179.0 lb

## 2024-02-08 DIAGNOSIS — E2740 Unspecified adrenocortical insufficiency: Secondary | ICD-10-CM | POA: Diagnosis not present

## 2024-02-08 DIAGNOSIS — E291 Testicular hypofunction: Secondary | ICD-10-CM | POA: Diagnosis not present

## 2024-02-08 DIAGNOSIS — Z7952 Long term (current) use of systemic steroids: Secondary | ICD-10-CM | POA: Diagnosis not present

## 2024-02-08 DIAGNOSIS — Z79890 Hormone replacement therapy (postmenopausal): Secondary | ICD-10-CM

## 2024-02-08 NOTE — Progress Notes (Signed)
 Obadiah Birmingham, MD 02/08/24   Patient ID: Aleene Lady, male   DOB: May 29, 1960, 63 y.o.   MRN: [id number]    Chief complaint: Followup of adrenal insufficiency  History of Present Illness:    Recent history: Currently taking Cortef  10 mg at 7 am and 5 mg at 2 pm  No passing out episodes since 05/2023  No history of heart attack/stroke/urine issues 04/26/23: Patient has primary hypogonadism confirmed by low free testosterone  with high LH, which produces more estrogen, boosting SHBG, pseudo normalizing the total testosterone . Interested in starting testosterone  treatment for primary hypogonadism 04/28/23 MRI brain W/WO reported : The pituitary gland is normal in size with a concave upper margin. The infundibulum is midline. The gland enhances in a homogeneous fashion. No evidence of pituitary adenoma. I think the findings on the previous study were due to partial volume averaging with the cavernous sinuses.    Previously, patient changed doses or dose timings or stopped taking medication to feel the kick on resuming the steroid pills as he thought he developed tolerance to steroids  Not taking florinef  0.1 mg half tablet alternate days - due to dizziness Previously had another episode of fully passing out for about 4 min, no with pulse, relative was EMT and was going to start CPR but he got self revived, was pale, drenched in sweat from head to thighs but did not froth/urinate. BP at hour ago was on lower side of 107 SBP with normal HR. BG was not checked.  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 after treatment with ipilimumab  His main symptom was weight loss, tiredness, weakness and lightheadedness along with passing out starting in January 2023 This was diagnosed by his oncologist Baseline cortisol level was 1.1 and ACTH  level was undetectable in 1/23 Although initially was treated with hydrocortisone  and continued to have some syncope and  Florinef  was added but this was stopped subsequently when blood pressure went up  Because of low normal standing blood pressures in 8/23 he was started on Florinef  3 days a week and switched from prednisone  to hydrocortisone  20 mg in the morning and 10 mg in the evening  He had stopped Florinef  for a second time because of rising blood pressure up to 164 and symptoms of throbbing in the head and eye pain  At one point, his blood pressure readings had been around 100 systolic at home and he was having significant decreased appetite, feeling lightheaded and feeling like passing out This improved significantly after doubling his hydrocortisone  for a week Previously when he went back to the regimen of 20 mg hydrocortisone  in the morning and 10 in the afternoon he started getting fatigued and drowsy in the early afternoon With this he started taking an extra half a tablet of Cortef  around 1 PM with improvement in symptoms but felt same in office subsequently  He says he is getting the brand-name Cortef   04/2021 MRI HEAD WITHOUT AND WITH CONTRAST: Questionable pituitary MRI showed 4 mm nodule 02/2022 MRI HEAD WITHOUT AND WITH CONTRAST: Stable hypoenhancing area on the left side of the pituitary. This may represent a pituitary microadenoma    Lab Results  Component Value Date   CRTSLPL 1.1 (L) 04/18/2023   CRTSLPL 6.5 06/23/2021   CRTSLPL 0.7 06/23/2021   CRTSLPL 1.1 05/22/2021    PITUITARY functions:  At baseline he had the following labs Prolactin: borderline high.   Total testosterone  WNL  THYROID :  TSH level was suppressed in 11/22 and free  T4 was above normal only transiently in 1/23 at 1.82        He was given methimazole  for short time but this was stopped when TSH went up higher at 11  Wt Readings from Last 3 Encounters:  02/08/24 179 lb (81.2 kg)  11/08/23 182 lb (82.6 kg)  09/13/23 183 lb (83 kg)   Lab Results  Component Value Date   TSH 1.965 06/28/2023   TSH 1.247  03/21/2023   TSH 1.930 10/19/2022   FREET4 1.3 04/18/2023   FREET4 1.14 09/22/2022   FREET4 0.94 07/28/2022   Component     Latest Ref Rng 06/23/2021 12/15/2021 07/28/2022  Testosterone      300.00 - 890.00 ng/dL 518  608.78  625.70      No visits with results within 1 Week(s) from this visit.  Latest known visit with results is:  Office Visit on 11/08/2023  Component Date Value Ref Range Status   Testosterone  02/03/2024 722  250 - 827 ng/dL Final   Albumin 90/80/7974 4.1  3.6 - 5.1 g/dL Final   Sex Hormone Binding 02/03/2024 55  22 - 77 nmol/L Final   Testosterone , Free 02/03/2024 66.8  46.0 - 224.0 pg/mL Final   Testosterone , Bioavailable 02/03/2024 125.7  110.0 - 575.0 ng/dL Final   Glucose, Bld 90/80/7974 128 (H)  65 - 99 mg/dL Final   Comment: .            Fasting reference interval . For someone without known diabetes, a glucose value >125 mg/dL indicates that they may have diabetes and this should be confirmed with a follow-up test. .    BUN 02/03/2024 10  7 - 25 mg/dL Final   Creat 90/80/7974 1.06  0.70 - 1.35 mg/dL Final   eGFR 90/80/7974 78  > OR = 60 mL/min/1.47m2 Final   BUN/Creatinine Ratio 02/03/2024 SEE NOTE:  6 - 22 (calc) Final   Comment:    Not Reported: BUN and Creatinine are within    reference range. .    Sodium 02/03/2024 136  135 - 146 mmol/L Final   Potassium 02/03/2024 4.6  3.5 - 5.3 mmol/L Final   Chloride 02/03/2024 104  98 - 110 mmol/L Final   CO2 02/03/2024 21  20 - 32 mmol/L Final   Calcium 02/03/2024 9.3  8.6 - 10.3 mg/dL Final    Allergies as of 02/08/2024       Reactions   Duloxetine     dizziness        Medication List        Accurate as of February 08, 2024 12:18 PM. If you have any questions, ask your nurse or doctor.          albuterol  108 (90 Base) MCG/ACT inhaler Commonly known as: VENTOLIN  HFA Inhale 2 puffs into the lungs every 4 (four) hours as needed for wheezing or shortness of breath.   Blood Glucose  Monitoring Suppl Devi 1 each by Does not apply route in the morning, at noon, and at bedtime. May substitute to any manufacturer covered by patient's insurance.   Testosterone  1.62 % Gel Place 0.5 Pump onto the skin daily.   Trelegy Ellipta  100-62.5-25 MCG/ACT Aepb Generic drug: Fluticasone -Umeclidin-Vilant INHALE 1 PUFF INTO THE LUNGS ONCE DAILY        Allergies:  Allergies  Allergen Reactions   Duloxetine      dizziness    Past Medical History:  Diagnosis Date   Adrenal insufficiency    Anxiety 01/01/20   Cataract 01/2023  COPD (chronic obstructive pulmonary disease) (HCC)    Depression    Dyspnea    with 100ft of ambulation. 200 feet if he walks slow.   Emphysema of lung (HCC) 01/01/20   Fatty liver    History of kidney stones    seen on CT Scan   Neuromuscular disorder (HCC) 01/01/20   Skin cancer (melanoma) (HCC)    Substance abuse (HCC)    Alcohol   Thyroid  disease     Past Surgical History:  Procedure Laterality Date   MASS EXCISION Right 12/15/2020   Procedure: EXCISION OF MALIGNANT MASS RIGHT POSTERIOR SHOULDER;  Surgeon: Aron Shoulders, MD;  Location: [facility] OR;  Service: General;  Laterality: Right;   WISDOM TOOTH EXTRACTION      Family History  Problem Relation Age of Onset   Stroke Mother    Dementia Mother    Heart disease Mother    Breast cancer Sister    Thyroid  cancer Sister    Ovarian cancer Sister    Stroke Maternal Grandmother    Heart disease Maternal Grandmother    Breast cancer Paternal Grandmother    Cancer Paternal Grandmother    Diabetes Paternal Aunt    Diabetes Paternal Uncle    Hypertension Daughter    Other Daughter        Uterine cysts   Diabetes Maternal Aunt    Thyroid  disease Neg Hx     Social History:  reports that he has been smoking cigarettes and cigars. He started smoking about 42 years ago. He has a 21.4 pack-year smoking history. He has never used smokeless tobacco. He reports current alcohol use of about 32.0  standard drinks of alcohol per week. He reports that he does not use drugs.  Review of Systems  Same as above   Examination:   BP 112/80   Pulse 67   Ht 6' (1.829 m)   Wt 179 lb (81.2 kg)   SpO2 97%   BMI 24.28 kg/m   Physical Exam Constitutional:      Appearance: Normal appearance. Not ill-appearing.  HENT:     Head: Normocephalic and atraumatic.  Eyes:     General: No scleral icterus.    Conjunctiva/sclera: Conjunctivae normal.  Pulmonary:     Effort: Pulmonary effort is normal. No respiratory distress.  Musculoskeletal:     Cervical back: Normal range of motion. No rigidity.  Previously done Genitourinary exam: testes WNL, volume estimated 20 ml B/L, no abnormalities/tenderness/mass notes  Skin:    Coloration: Skin is not jaundiced.     Findings: No rash.  Neurological:     Mental Status: Alert and oriented to person, place, and time. Psychiatric:        Behavior: Behavior normal.        Judgment: Judgment normal.   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 Continue current Cortef   10 mg at 7 am AND 5 mg at 2 pm, self stopped florinef  0.1 mg half tablet alternate days (improved salt by excess salt intake): Does not feel better when takes 15 mg in morning  Discussed previously the side effects  of excessive hydrocortisone , patient agrees and feels that he has been dealing with the same including behavioral issues as well as skin thinning Patient finds no difference between brand Cortef  and generic hydrocortisone  Continue checking blood pressure at home, if blood pressure drops, try half a pill of 0.1 mg Florinef  as tolerated Reports already following with cardiac etiology and neurology in the past.   Passing out episodes stopped after starting testosterone   Previously, Recommend incorporating water and protein drinks, avoid red meats/plain sugars/sugary beverages   Check orthostatic BP and HR when he feels episodes coming as well as checking BG Doubling up steroid dose of days of impending episode   04/26/23 Patient has primary hypogonadism confirmed by low free testosterone  with high LH, which produces more estrogen, boosting SHBG, pseudo normalizing the total testosterone . Was on Androgel  25 mg every day-not available in pharmacy, started 50 mg testosterone  cypionate every 2 weeks leading to elevated testosterone  level drawn 2 days after the shot, instructed patient to decrease the testosterone  cypionate dose to 40 mg until he starts on the 1.62% testosterone  gel half a pump once a day and repeat labs before next visit including PSA and CBC.  PSA went up by more than 1 point after patient started taking the testosterone  shot, so we decreased testosterone  treatment  11/03/23: testosterone  is back down to normal total, with PSA down, HCT staying normal Continue current dose of half a pump a day of testosterone  (mark pump in half by a pen) (not abel to get gel packs/injections) Repeat labs before next visit  Discussed risks and S/E  04/2021 MRI HEAD WITHOUT AND WITH CONTRAST: Questionable pituitary MRI showed 4 mm nodule 02/2022 MRI HEAD WITHOUT AND WITH CONTRAST: Stable hypoenhancing area on the left side of the pituitary. This may represent a pituitary microadenoma 04/28/23: 02/2022  MRI HEAD WITHOUT AND WITH CONTRAST The pituitary gland is normal in size with a concave upper margin. The infundibulum is midline. The gland enhances in a homogeneous fashion. No evidence of pituitary adenoma. I think the findings on the previous study were due to partial volume averaging with the cavernous sinuses. No further MRI/pituitary work up needed   Return in about 4 months (around 06/09/2024).   Danielly Ackerley 02/08/2024, 12:18 PM

## 2024-02-17 ENCOUNTER — Other Ambulatory Visit: Payer: Self-pay

## 2024-02-17 DIAGNOSIS — C4359 Malignant melanoma of other part of trunk: Secondary | ICD-10-CM

## 2024-02-17 DIAGNOSIS — R7989 Other specified abnormal findings of blood chemistry: Secondary | ICD-10-CM

## 2024-02-20 ENCOUNTER — Inpatient Hospital Stay: Admitting: Hematology

## 2024-02-20 ENCOUNTER — Inpatient Hospital Stay

## 2024-03-16 DIAGNOSIS — H35373 Puckering of macula, bilateral: Secondary | ICD-10-CM | POA: Diagnosis not present

## 2024-03-16 DIAGNOSIS — H2513 Age-related nuclear cataract, bilateral: Secondary | ICD-10-CM | POA: Diagnosis not present

## 2024-03-17 ENCOUNTER — Other Ambulatory Visit: Payer: Self-pay | Admitting: Family Medicine

## 2024-03-17 DIAGNOSIS — J449 Chronic obstructive pulmonary disease, unspecified: Secondary | ICD-10-CM

## 2024-03-20 ENCOUNTER — Other Ambulatory Visit: Payer: Self-pay | Admitting: Family Medicine

## 2024-03-20 DIAGNOSIS — J449 Chronic obstructive pulmonary disease, unspecified: Secondary | ICD-10-CM

## 2024-03-26 ENCOUNTER — Other Ambulatory Visit: Payer: Self-pay | Admitting: "Endocrinology

## 2024-03-26 DIAGNOSIS — E291 Testicular hypofunction: Secondary | ICD-10-CM

## 2024-03-28 ENCOUNTER — Encounter: Payer: Self-pay | Admitting: "Endocrinology

## 2024-03-30 ENCOUNTER — Other Ambulatory Visit (HOSPITAL_COMMUNITY): Payer: Self-pay

## 2024-03-30 ENCOUNTER — Telehealth: Payer: Self-pay

## 2024-03-30 NOTE — Telephone Encounter (Signed)
 Pharmacy Patient Advocate Encounter   Received notification from Pt Calls Messages that prior authorization for Testosterone  gel is required/requested.   Insurance verification completed.   The patient is insured through [facility].   Per test claim: PA required; PA submitted to above mentioned insurance via Prompt PA Key/confirmation #/[id number] Status is pending

## 2024-04-02 ENCOUNTER — Inpatient Hospital Stay: Attending: Hematology

## 2024-04-02 ENCOUNTER — Inpatient Hospital Stay: Admitting: Hematology

## 2024-04-02 ENCOUNTER — Other Ambulatory Visit: Payer: Self-pay | Admitting: "Endocrinology

## 2024-04-02 VITALS — BP 133/74 | HR 61 | Temp 97.9°F | Resp 20 | Wt 182.2 lb

## 2024-04-02 DIAGNOSIS — R7989 Other specified abnormal findings of blood chemistry: Secondary | ICD-10-CM

## 2024-04-02 DIAGNOSIS — Z809 Family history of malignant neoplasm, unspecified: Secondary | ICD-10-CM | POA: Diagnosis not present

## 2024-04-02 DIAGNOSIS — C799 Secondary malignant neoplasm of unspecified site: Secondary | ICD-10-CM | POA: Diagnosis not present

## 2024-04-02 DIAGNOSIS — C4359 Malignant melanoma of other part of trunk: Secondary | ICD-10-CM

## 2024-04-02 DIAGNOSIS — Z808 Family history of malignant neoplasm of other organs or systems: Secondary | ICD-10-CM | POA: Insufficient documentation

## 2024-04-02 DIAGNOSIS — C649 Malignant neoplasm of unspecified kidney, except renal pelvis: Secondary | ICD-10-CM | POA: Insufficient documentation

## 2024-04-02 DIAGNOSIS — D472 Monoclonal gammopathy: Secondary | ICD-10-CM | POA: Diagnosis not present

## 2024-04-02 DIAGNOSIS — F17210 Nicotine dependence, cigarettes, uncomplicated: Secondary | ICD-10-CM | POA: Diagnosis not present

## 2024-04-02 DIAGNOSIS — C4360 Malignant melanoma of unspecified upper limb, including shoulder: Secondary | ICD-10-CM | POA: Diagnosis not present

## 2024-04-02 DIAGNOSIS — Z8041 Family history of malignant neoplasm of ovary: Secondary | ICD-10-CM | POA: Diagnosis not present

## 2024-04-02 DIAGNOSIS — E2740 Unspecified adrenocortical insufficiency: Secondary | ICD-10-CM

## 2024-04-02 DIAGNOSIS — Z803 Family history of malignant neoplasm of breast: Secondary | ICD-10-CM | POA: Insufficient documentation

## 2024-04-02 LAB — CMP (CANCER CENTER ONLY)
ALT: 18 U/L (ref 0–44)
AST: 21 U/L (ref 15–41)
Albumin: 4 g/dL (ref 3.5–5.0)
Alkaline Phosphatase: 81 U/L (ref 38–126)
Anion gap: 8 (ref 5–15)
BUN: 13 mg/dL (ref 8–23)
CO2: 23 mmol/L (ref 22–32)
Calcium: 9 mg/dL (ref 8.9–10.3)
Chloride: 105 mmol/L (ref 98–111)
Creatinine: 0.99 mg/dL (ref 0.61–1.24)
GFR, Estimated: >60 mL/min (ref >60)
Glucose, Bld: 134 mg/dL — ABNORMAL HIGH (ref 70–99)
Potassium: 4.3 mmol/L (ref 3.5–5.1)
Sodium: 136 mmol/L (ref 135–145)
Total Bilirubin: 0.5 mg/dL (ref 0.0–1.2)
Total Protein: 7.1 g/dL (ref 6.5–8.1)

## 2024-04-02 LAB — CBC WITH DIFFERENTIAL (CANCER CENTER ONLY)
Abs Immature Granulocytes: 0.01 K/uL (ref 0.00–0.07)
Basophils Absolute: 0 K/uL (ref 0.0–0.1)
Basophils Relative: 1 %
Eosinophils Absolute: 0.3 K/uL (ref 0.0–0.5)
Eosinophils Relative: 4 %
HCT: 45.2 % (ref 39.0–52.0)
Hemoglobin: 15.6 g/dL (ref 13.0–17.0)
Immature Granulocytes: 0 %
Lymphocytes Relative: 36 %
Lymphs Abs: 2.6 K/uL (ref 0.7–4.0)
MCH: 33 pg (ref 26.0–34.0)
MCHC: 34.5 g/dL (ref 30.0–36.0)
MCV: 95.6 fL (ref 80.0–100.0)
Monocytes Absolute: 0.6 K/uL (ref 0.1–1.0)
Monocytes Relative: 8 %
Neutro Abs: 3.7 K/uL (ref 1.7–7.7)
Neutrophils Relative %: 51 %
Platelet Count: 202 K/uL (ref 150–400)
RBC: 4.73 MIL/uL (ref 4.22–5.81)
RDW: 12.3 % (ref 11.5–15.5)
WBC Count: 7.2 K/uL (ref 4.0–10.5)
nRBC: 0 % (ref 0.0–0.2)

## 2024-04-02 LAB — TSH: TSH: 2.78 u[IU]/mL (ref 0.350–4.500)

## 2024-04-02 NOTE — Progress Notes (Signed)
 HEMATOLOGY ONCOLOGY PROGRESS NOTE  Date of service: 04/02/2024  Patient Care Team: Chandra Toribio POUR, MD as PCP - General (Family Medicine) Nahser, Aleene PARAS, MD (Inactive) as PCP - Cardiology (Cardiology)  CHIEF COMPLAINT/PURPOSE OF CONSULTATION: Follow-up for continued evaluation and management of metastatic melanoma.  HISTORY OF PRESENTING ILLNESS: (06/16/2020) George West is a wonderful 63 y.o. male who has been referred to us  by Dr. Charlie Denise, MD for evaluation and management of monoclonal gammopathy. The pt reports that he is doing well overall. We are joined today by his wife.   The pt reports that he originally presented in October 2021 with SOB that had worsened recently. [name] had concerns of neuropathy, which led to all the labs done and signs of abnormal proteins. The protein is on Thymine, but denies Vitamin B12. 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 This numbness is accompanied by burning sensation, purple in color,intermittent swelling, but the pt denies cramping. The pt denies tingling/numbness in hands, pinched nerves in the back. He does note that he heard a popped in his back around two months ago, which immobilized him for a week. This did not affect his neuropathy. The pt notes he also has recently been experiencing lightheadedness/dizziness, which makes him have to stand still. He denies feelings of spinning around and notes this most often occurs after standing up.    The pt notes that he experienced a vasovagal syncope in October 2021, which lasted around 2-3 minutes and led to an ED visit. The pt's SOB occurs after walking short distances. The Pulmonologist got lung testing and an ECHO performed in November, which showed tight airways and decreased performance. 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 The ECHO showed normal function of heart. The pt notes he is on an inhaler (Combivent) but  notes minimal help. The pt reports that he smokes 1/2 pack a day currently, down from 1 pack daily.   The pt notes that his primary driver for having to stop walking or doing activity is his SOB. The neuropathy is primary cause once sitting down for extended periods of time. The pt denies any problems with falling or imbalance. He denies using a cane/walking stick.   The pt notes that he has had an enlarged lymph node in the neck area one year ago and has remain unchanged since then. He also notes a large bump in his upper back right. The pt notes his brother also has this.   The pt notes that he drinks a case of beer a week currently. Over five years ago, the pt notes that he drank two cases a week and a fifth of hard liquor a week in addition.Th pt notes that he has not drank water since he was a teenager, as he only drinks tea, coffee, beer, and liquor.   02/20/2020 IFE showed an IGM Kappa Monoclonal Protein detected.  02/20/2020 Serum protein electrophoresis showed two abnormal protein bands of 0.5 and 0.4.    On review of systems, pt reports SOB, neuropathy of lower extremities, lightheadedness/dizziness and denies muscle weakness, n/v/d, abdominal pain and any other symptoms.  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 Patient also has multiple slightly  hypermetabolic lymph nodes-right axillary, subpectoral and right cervical.  These could be from metastatic melanoma or a possible indolent lymphoma related to his IgM kappa monoclonal paraproteinemia. -Molecular testing was sent on the pathology sample and is BRAF positive -PD-L1 testing positive for PD-L1 expression at 1%   01/13/2021 - 08/14/2021 Chemotherapy   Patient is  on Treatment Plan : RENAL CELL CARCINOMA Nivolumab  + Ipilimumab  q21d / Nivolumab  q14d     04/16/2021 PET scan   Moderate metabolic activity in excision cavity of RIGHT back melanoma. Favor postsurgical inflammation. 2. Interval decrease in size and metabolic activity of RIGHT axillary and sub pectoralis lymph nodes. Lymph nodes are now normal size and with metabolic activity less than mediastinal blood pool.   06/29/2021 Cancer Staging   Staging form: Melanoma of the Skin, AJCC 8th Edition - Clinical: Stage IV (cTX, cM1) - Signed by Onesimo Emaline Brink, MD on 06/29/2021    INTERVAL HISTORY:  George West is a 63 y.o. male who is here today for continued evaluation and management of metastatic melanoma. accompanied by wife   he was last seen by me on 06/28/2023; at the time he mentioned experiencing some syncopal episodes, some enlarged lymph nodes on his right neck.  Today, he says that he's been doing well. Denies any fevers/chills, drenching night sweats, unexpected weight change, back pain, new bone pains, new lumps/bumps, leg swelling, SOB, headaches, diarrhea, or worsened fatigue. He is still following with Dr. Motwani for adrenal insufficiency and testosterone , still seeing Dermatology regularly, and follows with his PCP every 6 months. States that he has not has any issues with syncope, and his sodium and blood pressure have been stable. Notes that he has not taken pancreatic enzymes for about two years  Reports that the knot on the side of his right neck has not changed. He does admit to still smoking 0.5 PPD daily.   REVIEW OF SYSTEMS:   10 Point review of systems of done and is negative except as noted above.  MEDICAL HISTORY Past Medical History:  Diagnosis Date   Adrenal insufficiency    Anxiety 01/01/20   Cataract 01/2023   COPD (chronic obstructive pulmonary disease) (HCC)    Depression    Dyspnea    with 73ft of ambulation. 200 feet if he walks slow.   Emphysema of lung  (HCC) 01/01/20   Fatty liver    History of kidney stones    seen on CT Scan   Neuromuscular disorder (HCC) 01/01/20   Skin cancer (melanoma) (HCC)    Substance abuse (HCC)    Alcohol   Thyroid  disease     SURGICAL HISTORY Past Surgical History:  Procedure Laterality Date   MASS EXCISION Right 12/15/2020   Procedure: EXCISION OF MALIGNANT MASS RIGHT POSTERIOR SHOULDER;  Surgeon: Aron Shoulders, MD;  Location: [facility] OR;  Service: General;  Laterality: Right;   WISDOM TOOTH EXTRACTION      SOCIAL HISTORY Social History   Tobacco Use   Smoking status: Every Day    Current packs/day: 0.50    Average packs/day: 0.5 packs/day for 42.9 years (21.4 ttl pk-yrs)    Types: Cigarettes, Cigars    Start date: 05/17/1981   Smokeless tobacco: Never  Vaping Use   Vaping status: Never Used  Substance Use Topics   Alcohol use: Yes    Alcohol/week: 32.0 standard drinks of alcohol    Types: 18 Cans of beer, 14 Shots of liquor per week    Comment: 4 per day  Drug use: No    Social History   Social History Narrative   Married   2 children--- 1 daughter/1 son   6 step children    SOCIAL DRIVERS OF HEALTH SDOH Screenings   Food Insecurity: No Food Insecurity (08/08/2023)  Housing: Low Risk  (08/08/2023)  Transportation Needs: No Transportation Needs (08/08/2023)  Alcohol Screen: Medium Risk (08/08/2023)  Depression (PHQ2-9): Low Risk  (05/02/2023)  Financial Resource Strain: Low Risk  (08/08/2023)  Physical Activity: Unknown (08/08/2023)  Social Connections: Moderately Isolated (08/08/2023)  Stress: No Stress Concern Present (08/08/2023)  Tobacco Use: High Risk (02/08/2024)     FAMILY HISTORY Family History  Problem Relation Age of Onset   Stroke Mother    Dementia Mother    Heart disease Mother    Breast cancer Sister    Thyroid  cancer Sister    Ovarian cancer Sister    Stroke Maternal Grandmother    Heart disease Maternal Grandmother    Breast cancer Paternal Grandmother    Cancer  Paternal Grandmother    Diabetes Paternal Aunt    Diabetes Paternal Uncle    Hypertension Daughter    Other Daughter        Uterine cysts   Diabetes Maternal Aunt    Thyroid  disease Neg Hx      ALLERGIES: is allergic to duloxetine .  MEDICATIONS  Current Outpatient Medications  Medication Sig Dispense Refill   albuterol  (VENTOLIN  HFA) 108 (90 Base) MCG/ACT inhaler Inhale 2 puffs into the lungs every 4 (four) hours as needed for wheezing or shortness of breath. 1 each 3   Blood Glucose Monitoring Suppl DEVI 1 each by Does not apply route in the morning, at noon, and at bedtime. May substitute to any manufacturer covered by patient's insurance. 1 each 0   Fluticasone -Umeclidin-Vilant (TRELEGY ELLIPTA ) 100-62.5-25 MCG/ACT AEPB INHALE 1 PUFF ONCE DAILY 60 each 0   Testosterone  1.62 % GEL TOPICALLY APPLY 1/2 (ONE-HALF) PUMP ONTO THE SKIN ONCE DAILY 75 g 1   No current facility-administered medications for this visit.    PHYSICAL EXAMINATION: ECOG PERFORMANCE STATUS: 1 - Symptomatic but completely ambulatory VITALS: Vitals:   04/02/24 1017  BP: 133/74  Pulse: 61  Resp: 20  Temp: 97.9 F (36.6 C)  SpO2: 93%   Filed Weights   04/02/24 1017  Weight: 182 lb 3.2 oz (82.6 kg)   Body mass index is 24.71 kg/m.  GENERAL: alert, in no acute distress and comfortable SKIN: no acute rashes, no significant lesions EYES: conjunctiva are pink and non-injected, sclera anicteric OROPHARYNX: MMM, no exudates, no oropharyngeal erythema or ulceration NECK: supple, no JVD LYMPH:  no palpable lymphadenopathy in the cervical, axillary or inguinal regions LUNGS: clear to auscultation b/l with normal respiratory effort HEART: regular rate & rhythm ABDOMEN:  normoactive bowel sounds , non tender, not distended, no hepatosplenomegaly Extremity: no pedal edema PSYCH: alert & oriented x 3 with fluent speech NEURO: no focal motor/sensory deficits  LABORATORY DATA:   I have reviewed the data as  listed     Latest Ref Rng & Units 04/02/2024    9:48 AM 11/03/2023    8:03 AM 06/28/2023   12:43 PM  CBC EXTENDED  WBC 4.0 - 10.5 K/uL 7.2  7.3  7.7   RBC 4.22 - 5.81 MIL/uL 4.73  4.83  5.09   Hemoglobin 13.0 - 17.0 g/dL 84.3  83.8  83.5   HCT 39.0 - 52.0 % 45.2  47.8  49.6   Platelets 150 - 400  K/uL 202  201  205   NEUT# 1.7 - 7.7 K/uL 3.7  3,395  4.6   Lymph# 0.7 - 4.0 K/uL 2.6   2.4    TSH:  Lab Results  Component Value Date   TSH 1.965 06/28/2023      Latest Ref Rng & Units 04/02/2024    9:48 AM 02/03/2024    8:12 AM 06/28/2023   12:43 PM  CMP  Glucose 70 - 99 mg/dL 865  871  896   BUN 8 - 23 mg/dL 13  10  11    Creatinine 0.61 - 1.24 mg/dL 9.00  8.93  9.05   Sodium 135 - 145 mmol/L 136  136  138   Potassium 3.5 - 5.1 mmol/L 4.3  4.6  5.0   Chloride 98 - 111 mmol/L 105  104  105   CO2 22 - 32 mmol/L 23  21  27    Calcium 8.9 - 10.3 mg/dL 9.0  9.3  9.4   Total Protein 6.5 - 8.1 g/dL 7.1   8.0   Total Bilirubin 0.0 - 1.2 mg/dL 0.5   0.8   Alkaline Phos 38 - 126 U/L 81   104   AST 15 - 41 U/L 21   21   ALT 0 - 44 U/L 18   19    SURGICAL PATHOLOGY  CASE: [id number]  PATIENT: Quintus Philbin  Surgical Pathology Report   Clinical History: right posterior shoulder soft tissue mass (cm)   FINAL MICROSCOPIC DIAGNOSIS:   A. SOFT TISSUE MASS, RIGHT POSTERIOR SHOULDER, NEEDLE CORE BIOPSY:  -  Malignant melanoma  -  See comment   COMMENT:   By immunohistochemistry, the neoplastic cells are positive for S100,  HMB45, Sox 10 and Melan-A (patchy) but negative for CD45, CD138,  cytokeratin 7, cytokeratin 20, cytokeratin AE1/3, desmin and EMA.  The  morphology and immunophenotype are consistent with malignant melanoma.  Dr. Onesimo was notified of these results on November 24, 2020.  Dr. Belvie  reviewed the case and agrees with the above diagnosis.       RADIOGRAPHIC STUDIES: I have personally reviewed the radiological images as listed and agreed with the findings in the  report. No results found.   ASSESSMENT & PLAN:   63 y.o. 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  Unclear primary site with the patient presented with a metastatic deposit in the soft tissue/skin over the right posterior shoulder. Patient also has multiple slightly hypermetabolic lymph nodes-right axillary, subpectoral and right cervical.  These could be from metastatic melanoma or a possible indolent lymphoma related to his IgM kappa monoclonal paraproteinemia. 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  GI panel and C. difficile testing negative.  Colonoscopy with biopsy rule out any colitis or microscopic colitis.  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 Federico from GI.  PLAN: - Discussed lab results on 04/02/2024 in detail with patient: CBC and CMP stable.  Independently reviewed TSH: wnl at 2.78 - no clinical signs or symptoms suggestive of melanoma recurrence/progression at this time. - Continue regular follow-ups with PCP, Dermatology, and Endocrinology.  - I addressed the importance  of smoking cessation with the patient. We will continue to address this moving forward.  FOLLOW-UP  Labs in 24 weeks Return to clinic with Dr. Onesimo in 26 weeks Continue follow-up with dermatology as per the recommendations Continue follow-up with endocrinology for management of pituitary insufficiency  The total time spent in the appointment was 25 minutes* .  All of the patient's questions were answered and the patient knows to call the clinic with any problems,  questions, or concerns.  Emaline Onesimo MD MS AAHIVMS [facility] Hematology/Oncology Physician [facility]  *Total Encounter Time as defined by the Centers for Medicare and Medicaid Services includes, in addition to the face-to-face time of a patient visit (documented in the note above) non-face-to-face time: obtaining and reviewing outside history, ordering and reviewing medications, tests or procedures, care coordination (communications with other health care professionals or caregivers) and documentation in the medical record.  I,Emily Lagle,acting as a neurosurgeon for Emaline Onesimo, MD.,have documented all relevant documentation on the behalf of Emaline Onesimo, MD,as directed by  Emaline Onesimo, MD while in the presence of Emaline Onesimo, MD.  I have reviewed the above documentation for accuracy and completeness, and I agree with the above.  Ferne Ellingwood, MD

## 2024-04-05 NOTE — Telephone Encounter (Signed)
 Pharmacy Patient Advocate Encounter  Received notification from [employer] that Prior Authorization for Testosterone  gel  has been APPROVED from 03/30/2024 to 03/29/2026

## 2024-04-17 ENCOUNTER — Other Ambulatory Visit: Payer: Self-pay

## 2024-04-17 DIAGNOSIS — J449 Chronic obstructive pulmonary disease, unspecified: Secondary | ICD-10-CM

## 2024-05-01 ENCOUNTER — Encounter: Payer: Self-pay | Admitting: Family Medicine

## 2024-05-01 ENCOUNTER — Ambulatory Visit: Payer: BC Managed Care – PPO | Admitting: Family Medicine

## 2024-05-01 VITALS — BP 130/77 | HR 69 | Ht 72.0 in | Wt 182.0 lb

## 2024-05-01 DIAGNOSIS — Z79890 Hormone replacement therapy (postmenopausal): Secondary | ICD-10-CM | POA: Diagnosis not present

## 2024-05-01 DIAGNOSIS — Z720 Tobacco use: Secondary | ICD-10-CM | POA: Diagnosis not present

## 2024-05-01 DIAGNOSIS — Z0000 Encounter for general adult medical examination without abnormal findings: Secondary | ICD-10-CM | POA: Diagnosis not present

## 2024-05-01 DIAGNOSIS — G629 Polyneuropathy, unspecified: Secondary | ICD-10-CM | POA: Diagnosis not present

## 2024-05-01 DIAGNOSIS — C4359 Malignant melanoma of other part of trunk: Secondary | ICD-10-CM | POA: Diagnosis not present

## 2024-05-01 DIAGNOSIS — D472 Monoclonal gammopathy: Secondary | ICD-10-CM | POA: Diagnosis not present

## 2024-05-01 NOTE — Progress Notes (Unsigned)
° °  Annual physical  Subjective   Patient ID: George West, male    DOB: Dec 26, 1960  Age: 63 y.o. MRN: [id number]  Chief Complaint  Patient presents with   Annual Exam   HPI George West is a 63 y.o. old male here  for annual exam.   Subjective - Reports numbness in feet for the last 2-3 years, which is worsening. The sensation is described as a burning and tingling, particularly when lying down. It is not present in the fingers. - Denies any issues with dizziness or fainting spells. - Continues to smoke cigarettes, less than half a pack per day. Reports a gradual decrease in consumption. - No other concerns reported.  Medications Hydrocortisone , testosterone  gel, and a trilogy inhaler.  PMHx - History of pre-diabetes, with a last A1c of 5.7. - History of excellent cholesterol levels. - Follows with an endocrinologist and an oncologist. - Last dermatology follow-up was this year. [facility] annual lung cancer screening CT scans.  FHx No known family history of heart disease.  Social Hx Smokes less than half a pack of cigarettes per day. Denies use of nicotine  replacement therapy.  ROS Constitutional: Denies dizziness or fainting spells. Neurological: Reports worsening numbness, burning, and tingling in feet for 2-3 years. No numbness in fingers. All other systems reviewed and are negative.  Objective General: Appears well, no acute distress. HEENT: Oropharynx is clear. Extremities: No acute distress noted on inspection of feet.  Assessment and Plan 1. Peripheral Neuropathy: Reports worsening numbness, burning, and tingling in feet for 2-3 years. The sensation is worse when lying down. The etiology is unclear. A review of recent labs from oncology and endocrinology did not include a B12 or folate level. A B12 level from four years ago was on the lower end of normal. - Plan to check B12 and folate levels. An order will be placed, and the patient will be instructed to have this  drawn at his next scheduled lab appointment in May with his endocrinologist. - Discussed that if labs are normal, the neuropathy may be idiopathic. - Discussed symptomatic treatment options, including medications that can help with symptoms but do not reverse the condition. - Recommended a vibrating foot plate device, available for purchase online, which may help with burning and tingling sensations. A picture of the device will be provided.  2. Health Maintenance: Continues to follow with specialists. Last A1c and cholesterol panel were over a year ago and were within normal limits. Given the excellent results, it is not necessary to repeat these annually at this time. - Plan to continue current management.  3. Tobacco Use: Continues to smoke cigarettes, but is gradually decreasing the amount. - Plan to continue to encourage smoking cessation.  The ASCVD Risk score (Arnett DK, et al., 2019) failed to calculate for the following reasons:   The valid total cholesterol range is 130 to 320 mg/dL  Health Maintenance Due  Topic Date Due   Hepatitis C Screening  Never done      Objective:     There were no vitals taken for this visit. {Vitals History (Optional):23777}  Physical Exam   No results found for any visits on 05/01/24.      Assessment & Plan:   There are no diagnoses linked to this encounter.   No follow-ups on file.    Toribio MARLA Slain, MD

## 2024-05-01 NOTE — Patient Instructions (Addendum)
 It was nice to see you today,  We addressed the following topics today: - Please have your B12 and folate levels checked at your next lab appointment with your endocrinologist in May. You will need to tell them you have an order from me to have these labs drawn. - I have provided a picture of the vibrating foot plate device for you to consider purchasing to help with the burning and tingling in your feet.  Have a great day,  Rolan Slain, MD

## 2024-05-02 DIAGNOSIS — G629 Polyneuropathy, unspecified: Secondary | ICD-10-CM | POA: Insufficient documentation

## 2024-05-02 NOTE — Assessment & Plan Note (Signed)
 Reports worsening numbness, burning, and tingling in feet for 2-3 years. The sensation is worse when lying down. Has monoclonal paraproteinemia.  Also has hx of severe alcohol use that may have contributed.  Has tried medication in the past for this (gabapentin )without improvement.  - Recommended a vibrating foot plate device, available for purchase online, which may help with burning and tingling sensations. A picture of the device will be provided.

## 2024-05-02 NOTE — Assessment & Plan Note (Signed)
 Continue followups with oncoogy.  Had ct abd/pelvis/chest/neck in January. No new concerning findings at that time.

## 2024-05-02 NOTE — Assessment & Plan Note (Signed)
 Continues to use testosterone  topical gel prescribed by endocrinologist.

## 2024-05-02 NOTE — Assessment & Plan Note (Signed)
 Continues to follow with oncology.  Last saw them one month ago.  Next scheduled labs to include IFE and SPEP. Continues to have neuropathy

## 2024-05-02 NOTE — Assessment & Plan Note (Signed)
 Continues to smoke cigarettes, but is gradually decreasing the amount. -declines medication or nicotine  replacement.   - will attempt to continue to wean down gradually on his own

## 2024-06-08 ENCOUNTER — Ambulatory Visit: Admitting: Podiatry

## 2024-06-08 DIAGNOSIS — L600 Ingrowing nail: Secondary | ICD-10-CM

## 2024-06-08 NOTE — Progress Notes (Signed)
 "  Subjective:  Patient ID: George West, male    DOB: Dec 19, 1960,  MRN: [id number]  Chief Complaint  Patient presents with   Ingrown Toenail    64 y.o. male presents with the above complaint.  Patient presents for left hallux medial border ingrown painful to touch is progressing and worse worse with ambulation and shoe pressure would like to have removed.  He states he had ingrown removed in the past to the right side but the left side is causing more discomfort he has not seen and was prior to seeing me for this.  Denies any other acute complaints   Review of Systems: Negative except as noted in the HPI. Denies N/V/F/Ch.  Past Medical History:  Diagnosis Date   Adrenal insufficiency    Anxiety 01/01/20   Asthma Jan 2022   Cataract 01/2023   COPD (chronic obstructive pulmonary disease) (HCC) 01/01/20   Depression    Dyspnea    with 69ft of ambulation. 200 feet if he walks slow.   Emphysema of lung (HCC) 01/01/20   Fatty liver    History of kidney stones    seen on CT Scan   Neuromuscular disorder (HCC) 01/01/20   Seizures (HCC)    Skin cancer (melanoma) (HCC)    Substance abuse (HCC)    Alcohol   Thyroid  disease 05/22/21   Current Medications[1]  Tobacco Use History[2]  Allergies[3] Objective:  There were no vitals filed for this visit. There is no height or weight on file to calculate BMI. Constitutional Well developed. Well nourished.  Vascular Dorsalis pedis pulses palpable bilaterally. Posterior tibial pulses palpable bilaterally. Capillary refill normal to all digits.  No cyanosis or clubbing noted. Pedal hair growth normal.  Neurologic Normal speech. Oriented to person, place, and time. Epicritic sensation to light touch grossly present bilaterally.  Dermatologic Painful ingrowing nail at medial nail borders of the hallux nail left. No other open wounds. No skin lesions.  Orthopedic: Normal joint ROM without pain or crepitus bilaterally. No visible  deformities. No bony tenderness.   Radiographs: None Assessment:   1. Ingrown left big toenail    Plan:  Patient was evaluated and treated and all questions answered.  Ingrown Nail, left -Patient elects to proceed with minor surgery to remove ingrown toenail removal today. Consent reviewed and signed by patient. -Ingrown nail excised. See procedure note. -Educated on post-procedure care including soaking. Written instructions provided and reviewed. -Patient to follow up in 2 weeks for nail check if needed  Procedure: Excision of Ingrown Toenail Location: Left 1st toe medial nail borders. Anesthesia: Lidocaine  1% plain; 1.5 mL and Marcaine  0.5% plain; 1.5 mL, digital block. Skin Prep: Betadine. Dressing: Silvadene; telfa; dry, sterile, compression dressing. Technique: Following skin prep, the toe was exsanguinated and a tourniquet was secured at the base of the toe. The affected nail border was freed, split with a nail splitter, and excised. Chemical matrixectomy was then performed with phenol and irrigated out with alcohol. The tourniquet was then removed and sterile dressing applied. Disposition: Patient tolerated procedure well. Patient to return in 2 weeks for follow-up.   No follow-ups on file.    [1]  Current Outpatient Medications:    Blood Glucose Monitoring Suppl DEVI, 1 each by Does not apply route in the morning, at noon, and at bedtime. May substitute to any manufacturer covered by patient's insurance., Disp: 1 each, Rfl: 0   Fluticasone -Umeclidin-Vilant (TRELEGY ELLIPTA ) 100-62.5-25 MCG/ACT AEPB, INHALE 1 PUFF ONCE DAILY, Disp: 60 each, Rfl:  11   hydrocortisone  (CORTEF ) 5 MG tablet, TAKE 2 TABLETS BY MOUTH IN THE MORNING AND 1 IN THE AFTERNOON, Disp: 270 tablet, Rfl: 3   Testosterone  1.62 % GEL, TOPICALLY APPLY 1/2 (ONE-HALF) PUMP ONTO THE SKIN ONCE DAILY, Disp: 75 g, Rfl: 1 [2]  Social History Tobacco Use  Smoking Status Every Day   Current packs/day: 0.50    Average packs/day: 0.5 packs/day for 43.1 years (21.5 ttl pk-yrs)   Types: Cigarettes, Cigars   Start date: 05/17/1981  Smokeless Tobacco Never  [3]  Allergies Allergen Reactions   Duloxetine      dizziness   "

## 2024-06-18 ENCOUNTER — Encounter: Payer: Self-pay | Admitting: Podiatry

## 2024-06-19 ENCOUNTER — Other Ambulatory Visit: Payer: Self-pay | Admitting: Podiatry

## 2024-06-19 MED ORDER — DOXYCYCLINE HYCLATE 100 MG PO TABS: 100.0000 mg | ORAL_TABLET | Freq: Two times a day (BID) | ORAL | 0 refills | Status: AC

## 2024-09-17 ENCOUNTER — Inpatient Hospital Stay

## 2024-10-01 ENCOUNTER — Inpatient Hospital Stay: Admitting: Hematology

## 2025-05-02 ENCOUNTER — Encounter: Admitting: Family Medicine
